# Patient Record
Sex: Male | Born: 1970 | Race: White | Hispanic: No | Marital: Single | Smoking: Former smoker
Health system: Southern US, Community
[De-identification: ages and names within clinical notes are randomized; demographics above are authoritative.]

## PROBLEM LIST (undated history)

## (undated) DIAGNOSIS — F419 Anxiety disorder, unspecified: Secondary | ICD-10-CM

## (undated) DIAGNOSIS — F319 Bipolar disorder, unspecified: Secondary | ICD-10-CM

## (undated) DIAGNOSIS — F329 Major depressive disorder, single episode, unspecified: Secondary | ICD-10-CM

## (undated) DIAGNOSIS — F309 Manic episode, unspecified: Secondary | ICD-10-CM

## (undated) DIAGNOSIS — L409 Psoriasis, unspecified: Secondary | ICD-10-CM

## (undated) DIAGNOSIS — F32A Depression, unspecified: Secondary | ICD-10-CM

## (undated) HISTORY — DX: Manic episode, unspecified: F30.9

## (undated) HISTORY — DX: Bipolar disorder, unspecified: F31.9

## (undated) HISTORY — PX: OTHER SURGICAL HISTORY: SHX169

## (undated) HISTORY — PX: TONSILLECTOMY: SHX5217

## (undated) HISTORY — DX: Major depressive disorder, single episode, unspecified: F32.9

## (undated) HISTORY — PX: WRIST SURGERY: SHX841

## (undated) HISTORY — DX: Anxiety disorder, unspecified: F41.9

## (undated) HISTORY — DX: Depression, unspecified: F32.A

## (undated) HISTORY — DX: Psoriasis, unspecified: L40.9

## (undated) HISTORY — PX: APPENDECTOMY: SHX54

---

## 1997-11-02 ENCOUNTER — Inpatient Hospital Stay (HOSPITAL_COMMUNITY): Admission: EM | Admit: 1997-11-02 | Discharge: 1997-11-05 | Payer: Self-pay | Admitting: Emergency Medicine

## 2002-08-25 ENCOUNTER — Emergency Department (HOSPITAL_COMMUNITY): Admission: EM | Admit: 2002-08-25 | Discharge: 2002-08-25 | Payer: Self-pay | Admitting: Emergency Medicine

## 2003-09-18 ENCOUNTER — Emergency Department (HOSPITAL_COMMUNITY): Admission: EM | Admit: 2003-09-18 | Discharge: 2003-09-19 | Payer: Self-pay | Admitting: Emergency Medicine

## 2013-06-24 ENCOUNTER — Other Ambulatory Visit (HOSPITAL_COMMUNITY): Payer: Self-pay | Admitting: Nurse Practitioner

## 2013-06-24 ENCOUNTER — Ambulatory Visit (HOSPITAL_COMMUNITY)
Admission: RE | Admit: 2013-06-24 | Discharge: 2013-06-24 | Disposition: A | Discharge status: Home / Self Care | Payer: Self-pay | Source: Ambulatory Visit | Attending: Nurse Practitioner | Admitting: Nurse Practitioner

## 2013-06-24 DIAGNOSIS — G8929 Other chronic pain: Secondary | ICD-10-CM

## 2013-06-24 DIAGNOSIS — M533 Sacrococcygeal disorders, not elsewhere classified: Secondary | ICD-10-CM | POA: Insufficient documentation

## 2013-06-24 DIAGNOSIS — M545 Low back pain, unspecified: Secondary | ICD-10-CM | POA: Insufficient documentation

## 2013-07-29 ENCOUNTER — Other Ambulatory Visit (HOSPITAL_COMMUNITY): Payer: Self-pay | Admitting: *Deleted

## 2013-07-29 ENCOUNTER — Ambulatory Visit (HOSPITAL_COMMUNITY)
Admission: RE | Admit: 2013-07-29 | Discharge: 2013-07-29 | Disposition: A | Discharge status: Home / Self Care | Payer: Self-pay | Source: Ambulatory Visit | Attending: *Deleted | Admitting: *Deleted

## 2013-07-29 DIAGNOSIS — M533 Sacrococcygeal disorders, not elsewhere classified: Secondary | ICD-10-CM | POA: Insufficient documentation

## 2013-07-29 DIAGNOSIS — M545 Low back pain, unspecified: Secondary | ICD-10-CM | POA: Insufficient documentation

## 2013-07-29 DIAGNOSIS — IMO0001 Reserved for inherently not codable concepts without codable children: Secondary | ICD-10-CM | POA: Insufficient documentation

## 2013-09-16 ENCOUNTER — Encounter: Payer: Self-pay | Admitting: Family

## 2013-09-16 ENCOUNTER — Ambulatory Visit (INDEPENDENT_AMBULATORY_CARE_PROVIDER_SITE_OTHER): Payer: Self-pay | Admitting: Family

## 2013-09-16 ENCOUNTER — Encounter (INDEPENDENT_AMBULATORY_CARE_PROVIDER_SITE_OTHER): Payer: Self-pay

## 2013-09-16 VITALS — BP 125/79 | HR 79 | Temp 98.7°F | Ht 66.0 in | Wt 140.4 lb

## 2013-09-16 DIAGNOSIS — M533 Sacrococcygeal disorders, not elsewhere classified: Secondary | ICD-10-CM

## 2013-09-16 DIAGNOSIS — F3181 Bipolar II disorder: Secondary | ICD-10-CM | POA: Insufficient documentation

## 2013-09-16 DIAGNOSIS — B009 Herpesviral infection, unspecified: Secondary | ICD-10-CM | POA: Insufficient documentation

## 2013-09-16 DIAGNOSIS — F411 Generalized anxiety disorder: Secondary | ICD-10-CM | POA: Insufficient documentation

## 2013-09-16 NOTE — Progress Notes (Signed)
   Subjective:    Patient ID: Roger Duran, male    DOB: Jul 18, 1970, 43 y.o.   MRN: [id number]  HPI Pt new to office. States he has had a coccyx pain over the last year that has become worse. Pt reports the pain as a dull 9 out 10 constant pain. Pt has had an x-ray of coccyx that showed no fractures, or any other abnormalities. Pt states this is his 4th provider he has seen about this pain. States sitting makes pain worse-Has to use a pillow or sit on his side. Denies any injury.   Review of Systems  Musculoskeletal: Positive for back pain (Coccyx pain).  All other systems reviewed and are negative.      Objective:   Physical Exam  Vitals reviewed. Constitutional: He is oriented to person, place, and time.  Cardiovascular: Normal rate, regular rhythm, normal heart sounds and intact distal pulses.   No murmur heard. Pulmonary/Chest: Effort normal and breath sounds normal. No respiratory distress. He has no wheezes.  Musculoskeletal: He exhibits tenderness.  Coccyx pain   Neurological: He is alert and oriented to person, place, and time.  Skin: Skin is warm and dry.  Psychiatric: He has a normal mood and affect. His behavior is normal. Judgment and thought content normal.    BP 125/79  Pulse 79  Temp(Src) 98.7 F (37.1 C) (Oral)  Ht 5\' 6"  (1.676 m)  Wt 140 lb 6.4 oz (63.685 kg)  BMI 22.67 kg/m2       Assessment & Plan:  1. Coccyx pain Motrin or Tylenol for pain prn -Ice -Pillow to sit if helps - AMB referral to orthopedics  Evelina Dun, FNP

## 2013-09-16 NOTE — Patient Instructions (Addendum)
Tailbone Injury °The tailbone (coccyx) is the small bone at the lower end of the spine. A tailbone injury may involve stretched ligaments, bruising, or a broken bone (fracture). Women are more vulnerable to this injury due to having a wider pelvis. °CAUSES  °This type of injury typically occurs from falling and landing on the tailbone. Repeated strain or friction from actions such as rowing and bicycling may also injure the area. The tailbone can be injured during childbirth. Infections or tumors may also press on the tailbone and cause pain. Sometimes, the cause of injury is unknown. °SYMPTOMS  °· Bruising. °· Pain when sitting. °· Painful bowel movements. °· In women, pain during intercourse. °DIAGNOSIS  °Your caregiver can diagnose a tailbone injury based on your symptoms and a physical exam. X-rays may be taken if a fracture is suspected. Your caregiver may also use an MRI scan imaging test to evaluate your symptoms. °TREATMENT  °Your caregiver may prescribe medicines to help relieve your pain. Most tailbone injuries heal on their own in 4 to 6 weeks. However, if the injury is caused by an infection or tumor, the recovery period may vary. °PREVENTION  °Wear appropriate padding and sports gear when bicycling and rowing. This can help prevent an injury from repeated strain or friction. °HOME CARE INSTRUCTIONS  °· Put ice on the injured area. °· Put ice in a plastic bag. °· Place a towel between your skin and the bag. °· Leave the ice on for 15-20 minutes, every hour while awake for the first 1 to 2 days. °· Sit on a large, rubber or inflated ring or cushion to ease your pain. Lean forward when sitting to help decrease discomfort. °· Avoid sitting for long periods of time. °· Increase your activity as the pain allows. °· Only take over-the-counter or prescription medicines for pain, discomfort, or fever as directed by your caregiver. °· You may use stool softeners if it is painful to have a bowel movement, or as  directed by your caregiver. °· Eat a diet with plenty of fiber to help prevent constipation. °· Keep all follow-up appointments as directed by your caregiver. °SEEK MEDICAL CARE IF:  °· Your pain becomes worse. °· Your bowel movements cause a great deal of discomfort. °· You are unable to have a bowel movement. °· You have a fever. °MAKE SURE YOU: °· Understand these instructions. °· Will watch your condition. °· Will get help right away if you are not doing well or get worse. °Document Released: 04/15/2000 Document Revised: 07/11/2011 Document Reviewed: 11/11/2010 °[employer]® Patient Information ©2014 [employer]. ° °

## 2013-10-02 DIAGNOSIS — F339 Major depressive disorder, recurrent, unspecified: Secondary | ICD-10-CM | POA: Diagnosis not present

## 2013-10-18 DIAGNOSIS — M533 Sacrococcygeal disorders, not elsewhere classified: Secondary | ICD-10-CM | POA: Diagnosis not present

## 2013-10-18 DIAGNOSIS — S99919A Unspecified injury of unspecified ankle, initial encounter: Secondary | ICD-10-CM | POA: Diagnosis not present

## 2013-10-18 DIAGNOSIS — S298XXA Other specified injuries of thorax, initial encounter: Secondary | ICD-10-CM | POA: Diagnosis not present

## 2013-10-18 DIAGNOSIS — S6990XA Unspecified injury of unspecified wrist, hand and finger(s), initial encounter: Secondary | ICD-10-CM | POA: Diagnosis not present

## 2013-10-18 DIAGNOSIS — IMO0002 Reserved for concepts with insufficient information to code with codable children: Secondary | ICD-10-CM | POA: Diagnosis not present

## 2013-10-18 DIAGNOSIS — F341 Dysthymic disorder: Secondary | ICD-10-CM | POA: Diagnosis not present

## 2013-10-18 DIAGNOSIS — S59909A Unspecified injury of unspecified elbow, initial encounter: Secondary | ICD-10-CM | POA: Diagnosis not present

## 2013-10-18 DIAGNOSIS — M25539 Pain in unspecified wrist: Secondary | ICD-10-CM | POA: Diagnosis not present

## 2013-10-18 DIAGNOSIS — M79609 Pain in unspecified limb: Secondary | ICD-10-CM | POA: Diagnosis not present

## 2013-10-18 DIAGNOSIS — M25579 Pain in unspecified ankle and joints of unspecified foot: Secondary | ICD-10-CM | POA: Diagnosis not present

## 2013-10-18 DIAGNOSIS — S59919A Unspecified injury of unspecified forearm, initial encounter: Secondary | ICD-10-CM | POA: Diagnosis not present

## 2013-10-18 DIAGNOSIS — S8990XA Unspecified injury of unspecified lower leg, initial encounter: Secondary | ICD-10-CM | POA: Diagnosis not present

## 2013-10-18 DIAGNOSIS — M25559 Pain in unspecified hip: Secondary | ICD-10-CM | POA: Diagnosis not present

## 2013-10-22 DIAGNOSIS — L408 Other psoriasis: Secondary | ICD-10-CM | POA: Diagnosis not present

## 2013-10-22 DIAGNOSIS — K219 Gastro-esophageal reflux disease without esophagitis: Secondary | ICD-10-CM | POA: Diagnosis not present

## 2013-10-22 DIAGNOSIS — IMO0002 Reserved for concepts with insufficient information to code with codable children: Secondary | ICD-10-CM | POA: Diagnosis not present

## 2013-10-22 DIAGNOSIS — Z1331 Encounter for screening for depression: Secondary | ICD-10-CM | POA: Diagnosis not present

## 2013-10-22 DIAGNOSIS — F410 Panic disorder [episodic paroxysmal anxiety] without agoraphobia: Secondary | ICD-10-CM | POA: Diagnosis not present

## 2013-10-22 DIAGNOSIS — F319 Bipolar disorder, unspecified: Secondary | ICD-10-CM | POA: Diagnosis not present

## 2013-10-22 DIAGNOSIS — M545 Low back pain, unspecified: Secondary | ICD-10-CM | POA: Diagnosis not present

## 2013-10-30 DIAGNOSIS — L408 Other psoriasis: Secondary | ICD-10-CM | POA: Diagnosis not present

## 2013-10-30 DIAGNOSIS — Z79899 Other long term (current) drug therapy: Secondary | ICD-10-CM | POA: Diagnosis not present

## 2013-12-24 DIAGNOSIS — Z79899 Other long term (current) drug therapy: Secondary | ICD-10-CM | POA: Diagnosis not present

## 2013-12-24 DIAGNOSIS — Z09 Encounter for follow-up examination after completed treatment for conditions other than malignant neoplasm: Secondary | ICD-10-CM | POA: Diagnosis not present

## 2013-12-24 DIAGNOSIS — L408 Other psoriasis: Secondary | ICD-10-CM | POA: Diagnosis not present

## 2013-12-25 DIAGNOSIS — F339 Major depressive disorder, recurrent, unspecified: Secondary | ICD-10-CM | POA: Diagnosis not present

## 2014-01-07 ENCOUNTER — Ambulatory Visit (INDEPENDENT_AMBULATORY_CARE_PROVIDER_SITE_OTHER): Payer: Medicare Other | Admitting: Nurse Practitioner

## 2014-01-07 VITALS — BP 130/70 | HR 97 | Temp 98.4°F | Ht 66.0 in | Wt 143.0 lb

## 2014-01-07 DIAGNOSIS — B029 Zoster without complications: Secondary | ICD-10-CM

## 2014-01-07 MED ORDER — VALACYCLOVIR HCL 1 G PO TABS: 1000.0000 mg | ORAL_TABLET | Freq: Three times a day (TID) | ORAL | Status: DC

## 2014-01-07 MED ORDER — HYDROCODONE-ACETAMINOPHEN 5-325 MG PO TABS: 1.0000 | ORAL_TABLET | Freq: Four times a day (QID) | ORAL | Status: DC | PRN

## 2014-01-07 MED ORDER — HYDROCODONE-ACETAMINOPHEN 5-325 MG PO TABS
1.0000 | ORAL_TABLET | Freq: Four times a day (QID) | ORAL | Status: DC | PRN
Start: 2014-01-07 — End: 2014-01-07

## 2014-01-07 NOTE — Addendum Note (Signed)
Addended by: Chevis Pretty on: 01/07/2014 04:28 PM   Modules accepted: Orders

## 2014-01-07 NOTE — Patient Instructions (Signed)

## 2014-01-07 NOTE — Progress Notes (Signed)
   Subjective:    Patient ID: Roger Duran, male    DOB: 1970-12-31, 43 y.o.   MRN: [id number]  HPI Patient has a red annular area that started itching with some swelling on Sunday.  Having a burning sensation now with small yellow centered pustules with redness.  No drainage noted.  Not aware of any insect bite.    Review of Systems  Constitutional: Negative.   Respiratory: Negative.   Cardiovascular: Negative.   Skin:       Reddened area on left knee       Objective:   Physical Exam  Constitutional: He is oriented to person, place, and time. He appears well-developed and well-nourished.  Cardiovascular: Normal rate and normal heart sounds.   Pulmonary/Chest: Effort normal and breath sounds normal.  Neurological: He is alert and oriented to person, place, and time.  Skin: Rash noted.  Vesicular patchy lesions on right knee cap  Psychiatric: He has a normal mood and affect. His behavior is normal. Judgment and thought content normal.    BP 130/70  Pulse 97  Temp(Src) 98.4 F (36.9 C) (Oral)  Ht 5\' 6"  (1.676 m)  Wt 143 lb (64.864 kg)  BMI 23.09 kg/m2       Assessment & Plan:   1. Shingles    Meds ordered this encounter  Medications  . valACYclovir (VALTREX) 1000 MG tablet    Sig: Take 1 tablet (1,000 mg total) by mouth 3 (three) times daily.    Dispense:  21 tablet    Refill:  0    Order Specific Question:  Supervising Provider    Answer:  Chipper Herb [[id number]]  . HYDROcodone-acetaminophen (NORCO/VICODIN) 5-325 MG per tablet    Sig: Take 1 tablet by mouth every 6 (six) hours as needed for moderate pain.    Dispense:  40 tablet    Refill:  0    Order Specific Question:  Supervising Provider    Answer:  Chipper Herb [[id number]]   Avoid scratching or picking Cool compresses RTO prn   Mary-Margaret Hassell Done, FNP

## 2014-01-27 DIAGNOSIS — L0390 Cellulitis, unspecified: Secondary | ICD-10-CM | POA: Diagnosis not present

## 2014-01-27 DIAGNOSIS — L0291 Cutaneous abscess, unspecified: Secondary | ICD-10-CM | POA: Diagnosis not present

## 2014-02-17 DIAGNOSIS — Z23 Encounter for immunization: Secondary | ICD-10-CM | POA: Diagnosis not present

## 2014-07-09 ENCOUNTER — Ambulatory Visit (INDEPENDENT_AMBULATORY_CARE_PROVIDER_SITE_OTHER): Payer: Medicare Other | Admitting: Psychiatry

## 2014-07-09 ENCOUNTER — Encounter (HOSPITAL_COMMUNITY): Payer: Self-pay | Admitting: Psychiatry

## 2014-07-09 VITALS — BP 128/78 | HR 81 | Ht 66.0 in | Wt 150.8 lb

## 2014-07-09 DIAGNOSIS — F3130 Bipolar disorder, current episode depressed, mild or moderate severity, unspecified: Secondary | ICD-10-CM

## 2014-07-09 DIAGNOSIS — F3160 Bipolar disorder, current episode mixed, unspecified: Secondary | ICD-10-CM

## 2014-07-09 MED ORDER — LITHIUM CARBONATE 300 MG PO CAPS: 600.0000 mg | ORAL_CAPSULE | Freq: Two times a day (BID) | ORAL | Status: DC

## 2014-07-09 MED ORDER — GABAPENTIN 100 MG PO CAPS: 200.0000 mg | ORAL_CAPSULE | Freq: Three times a day (TID) | ORAL | Status: DC

## 2014-07-09 MED ORDER — LAMOTRIGINE 200 MG PO TABS: 200.0000 mg | ORAL_TABLET | Freq: Every day | ORAL | Status: DC

## 2014-07-09 MED ORDER — CLONAZEPAM 1 MG PO TABS: 1.0000 mg | ORAL_TABLET | Freq: Three times a day (TID) | ORAL | Status: DC
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
## 2014-08-08 ENCOUNTER — Encounter (HOSPITAL_COMMUNITY): Payer: Self-pay | Admitting: Psychiatry

## 2014-08-08 ENCOUNTER — Ambulatory Visit (INDEPENDENT_AMBULATORY_CARE_PROVIDER_SITE_OTHER): Payer: Medicare Other | Admitting: Psychiatry

## 2014-08-08 VITALS — BP 119/72 | HR 74 | Ht 66.0 in | Wt 152.2 lb

## 2014-08-08 DIAGNOSIS — F3130 Bipolar disorder, current episode depressed, mild or moderate severity, unspecified: Secondary | ICD-10-CM
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
## 2014-09-12 ENCOUNTER — Other Ambulatory Visit (HOSPITAL_COMMUNITY): Payer: Self-pay | Admitting: Adult Health Nurse Practitioner

## 2014-09-12 DIAGNOSIS — R52 Pain, unspecified: Secondary | ICD-10-CM

## 2014-09-12 DIAGNOSIS — R197 Diarrhea, unspecified: Secondary | ICD-10-CM

## 2014-09-12 DIAGNOSIS — R159 Full incontinence of feces: Secondary | ICD-10-CM

## 2014-09-16 ENCOUNTER — Ambulatory Visit (HOSPITAL_COMMUNITY)
Admission: RE | Admit: 2014-09-16 | Discharge: 2014-09-16 | Disposition: A | Discharge status: Home / Self Care | Payer: Medicare Other | Source: Ambulatory Visit | Attending: Adult Health Nurse Practitioner | Admitting: Adult Health Nurse Practitioner

## 2014-09-16 DIAGNOSIS — R52 Pain, unspecified: Secondary | ICD-10-CM

## 2014-09-16 DIAGNOSIS — R197 Diarrhea, unspecified: Secondary | ICD-10-CM | POA: Insufficient documentation

## 2014-09-16 DIAGNOSIS — R159 Full incontinence of feces: Secondary | ICD-10-CM | POA: Diagnosis not present

## 2014-09-19 ENCOUNTER — Other Ambulatory Visit (HOSPITAL_COMMUNITY): Payer: Self-pay

## 2014-09-23 ENCOUNTER — Encounter (INDEPENDENT_AMBULATORY_CARE_PROVIDER_SITE_OTHER): Payer: Self-pay | Admitting: *Deleted

## 2014-09-30 ENCOUNTER — Other Ambulatory Visit (HOSPITAL_COMMUNITY): Payer: Self-pay | Admitting: Adult Health Nurse Practitioner

## 2014-09-30 ENCOUNTER — Other Ambulatory Visit (HOSPITAL_COMMUNITY): Payer: Self-pay | Admitting: Family Medicine

## 2014-09-30 DIAGNOSIS — R1084 Generalized abdominal pain: Secondary | ICD-10-CM

## 2014-09-30 DIAGNOSIS — R197 Diarrhea, unspecified: Secondary | ICD-10-CM

## 2014-10-03 ENCOUNTER — Encounter (HOSPITAL_COMMUNITY): Payer: Self-pay

## 2014-10-03 ENCOUNTER — Encounter (HOSPITAL_COMMUNITY)
Admission: RE | Admit: 2014-10-03 | Discharge: 2014-10-03 | Disposition: A | Discharge status: Home / Self Care | Payer: Medicare Other | Source: Ambulatory Visit | Attending: Adult Health Nurse Practitioner | Admitting: Adult Health Nurse Practitioner

## 2014-10-03 DIAGNOSIS — R197 Diarrhea, unspecified: Secondary | ICD-10-CM | POA: Diagnosis not present

## 2014-10-03 DIAGNOSIS — R932 Abnormal findings on diagnostic imaging of liver and biliary tract: Secondary | ICD-10-CM | POA: Insufficient documentation

## 2014-10-03 DIAGNOSIS — R1084 Generalized abdominal pain: Secondary | ICD-10-CM | POA: Diagnosis present

## 2014-10-03 MED ORDER — STERILE WATER FOR INJECTION IJ SOLN
INTRAMUSCULAR | Status: AC
  Administered 2014-10-03: 1.36 mL via INTRAVENOUS
  Filled 2014-10-03: qty 10

## 2014-10-03 MED ORDER — SINCALIDE 5 MCG IJ SOLR
INTRAMUSCULAR | Status: AC
  Administered 2014-10-03: 1.36 ug via INTRAVENOUS
  Filled 2014-10-03: qty 5

## 2014-10-03 MED ORDER — TECHNETIUM TC 99M MEBROFENIN IV KIT
5.0000 | PACK | Freq: Once | INTRAVENOUS | Status: AC | PRN
  Administered 2014-10-03: 4.8 via INTRAVENOUS

## 2014-10-03 MED ORDER — SODIUM CHLORIDE 0.9 % IJ SOLN
INTRAMUSCULAR | Status: AC
  Filled 2014-10-03: qty 12

## 2014-10-08 ENCOUNTER — Encounter (HOSPITAL_COMMUNITY): Payer: Self-pay | Admitting: Psychiatry

## 2014-10-08 ENCOUNTER — Ambulatory Visit (INDEPENDENT_AMBULATORY_CARE_PROVIDER_SITE_OTHER): Payer: Medicare Other | Admitting: Psychiatry

## 2014-10-08 VITALS — BP 128/77 | HR 67 | Ht 66.0 in | Wt 148.8 lb

## 2014-10-08 DIAGNOSIS — F3160 Bipolar disorder, current episode mixed, unspecified: Secondary | ICD-10-CM | POA: Diagnosis not present

## 2014-10-08 DIAGNOSIS — F3130 Bipolar disorder, current episode depressed, mild or moderate severity, unspecified: Secondary | ICD-10-CM

## 2014-10-08 MED ORDER — GABAPENTIN 100 MG PO CAPS: 200.0000 mg | ORAL_CAPSULE | Freq: Three times a day (TID) | ORAL | Status: DC

## 2014-10-08 MED ORDER — LAMOTRIGINE 200 MG PO TABS: 300.0000 mg | ORAL_TABLET | Freq: Every day | ORAL | Status: DC

## 2014-10-08 MED ORDER — LITHIUM CARBONATE 300 MG PO CAPS: 600.0000 mg | ORAL_CAPSULE | Freq: Two times a day (BID) | ORAL | Status: DC

## 2014-10-08 MED ORDER — CLONAZEPAM 1 MG PO TABS: 1.0000 mg | ORAL_TABLET | Freq: Four times a day (QID) | ORAL | Status: DC
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
## 2014-10-15 ENCOUNTER — Encounter (INDEPENDENT_AMBULATORY_CARE_PROVIDER_SITE_OTHER): Payer: Self-pay | Admitting: Internal Medicine

## 2014-10-15 ENCOUNTER — Ambulatory Visit (INDEPENDENT_AMBULATORY_CARE_PROVIDER_SITE_OTHER): Payer: Medicare Other | Admitting: Internal Medicine

## 2014-10-15 VITALS — BP 86/60 | HR 76 | Temp 98.7°F | Ht 66.0 in | Wt 150.6 lb

## 2014-10-15 DIAGNOSIS — R152 Fecal urgency: Secondary | ICD-10-CM | POA: Diagnosis not present

## 2014-10-15 DIAGNOSIS — R197 Diarrhea, unspecified: Secondary | ICD-10-CM | POA: Diagnosis not present

## 2014-10-15 LAB — HEPATIC FUNCTION PANEL
ALBUMIN: 4.9 g/dL (ref 3.5–5.2)
ALK PHOS: 85 U/L (ref 39–117)
ALT: 57 U/L — ABNORMAL HIGH (ref 0–53)
AST: 36 U/L (ref 0–37)
BILIRUBIN DIRECT: 0.1 mg/dL (ref 0.0–0.3)
BILIRUBIN INDIRECT: 0.5 mg/dL (ref 0.2–1.2)
BILIRUBIN TOTAL: 0.6 mg/dL (ref 0.2–1.2)
Total Protein: 7.4 g/dL (ref 6.0–8.3)

## 2014-10-15 LAB — CBC WITH DIFFERENTIAL/PLATELET
BASOS ABS: 0.1 10*3/uL (ref 0.0–0.1)
Basophils Relative: 1 % (ref 0–1)
Eosinophils Absolute: 0.3 10*3/uL (ref 0.0–0.7)
Eosinophils Relative: 3 % (ref 0–5)
HCT: 44.7 % (ref 39.0–52.0)
Hemoglobin: 15.1 g/dL (ref 13.0–17.0)
LYMPHS ABS: 1.4 10*3/uL (ref 0.7–4.0)
LYMPHS PCT: 14 % (ref 12–46)
MCH: 35.4 pg — ABNORMAL HIGH (ref 26.0–34.0)
MCHC: 33.8 g/dL (ref 30.0–36.0)
MCV: 104.7 fL — ABNORMAL HIGH (ref 78.0–100.0)
MONO ABS: 0.7 10*3/uL (ref 0.1–1.0)
MONOS PCT: 7 % (ref 3–12)
MPV: 10.2 fL (ref 8.6–12.4)
NEUTROS ABS: 7.3 10*3/uL (ref 1.7–7.7)
Neutrophils Relative %: 75 % (ref 43–77)
Platelets: 366 10*3/uL (ref 150–400)
RBC: 4.27 MIL/uL (ref 4.22–5.81)
RDW: 15.1 % (ref 11.5–15.5)
WBC: 9.7 10*3/uL (ref 4.0–10.5)

## 2014-10-15 MED ORDER — DICYCLOMINE HCL 10 MG PO CAPS: 10.0000 mg | ORAL_CAPSULE | Freq: Three times a day (TID) | ORAL | Status: DC

## 2014-10-15 NOTE — Progress Notes (Signed)
Subjective:    Patient ID: Roger Duran, male    DOB: 1971-04-10, 44 y.o.   MRN: [id number]  HPI Referred to our office by Bridget Hartshorn NP for diarrhea. He says he always has diarrhea due to the Lithium.  For the past 6 months he has had explosive diarrhea.    . He says he is incontinent of stool at times. He has urgency.  He has tried Imodium but it will constipate him for a few days.  He says the diarrhe uncontrollable. He says his stool smells like fish.  He is having 4-5 stools a day . He has not seen any blood.  Stools cultures earlier were negative. Doxycycline about 2-3 months ago for dermatitis. Appetite is good for the most part. There has been no weight loss. He says he has gained 15 pounds since last years.  No family hx of colon cancer. Father has polyps. No NSAIDs.  Recently had an abnormal HIDA scan. See below.  07/31/2013 H and H 14.6 and 43.1, MCV 100.7, platelet ct 388, total bili 0.7, ALP 73, ASAT 15, ALT 13, total protein 7.0, Albumin 4.5, Calcium 9.5, Lipase 11, H. Pylori negative.   10/03/2014 HIDA scan:  IMPRESSION: Abnormally low gallbladder ejection fraction of 17%.   Review of Systems Single, no children. Disabled.  Past Medical History  Diagnosis Date  . Depression   . Anxiety   . Mania   . Psoriasis   . Bipolar 1 disorder     Past Surgical History  Procedure Laterality Date  . Appendectomy    . Tonsillectomy    . Wrist surgery      Allergies  Allergen Reactions  . Penicillins Itching and Rash    Current Outpatient Prescriptions on File Prior to Visit  Medication Sig Dispense Refill  . acyclovir (ZOVIRAX) 400 MG tablet Take 400 mg by mouth 2 (two) times daily.    . clonazePAM (KLONOPIN) 1 MG tablet Take 1 tablet (1 mg total) by mouth 4 (four) times daily. 120 tablet 2  . ergocalciferol (VITAMIN D2) 50000 UNITS capsule Take 50,000 Units by mouth once a week.    . Folic Acid-Vit S0-YTK Z60 (FOLBEE) 2.5-25-1 MG TABS tablet Take 1 tablet  by mouth daily.    Marland Kitchen gabapentin (NEURONTIN) 100 MG capsule Take 2 capsules (200 mg total) by mouth 3 (three) times daily. 180 capsule 2  . lamoTRIgine (LAMICTAL) 200 MG tablet Take 1.5 tablets (300 mg total) by mouth daily. 45 tablet 2  . lithium carbonate 300 MG capsule Take 2 capsules (600 mg total) by mouth 2 (two) times daily with a meal. 120 capsule 2  . loratadine (CLARITIN) 10 MG tablet Take 10 mg by mouth daily.    . Melatonin 10 MG TABS Take 20 mg by mouth at bedtime.    . methotrexate (RHEUMATREX) 2.5 MG tablet Take 2.5 mg by mouth. TAKING 3 TABLETS ON Monday and 3 TABLETS ON Friday..Caution:Chemotherapy. Protect from light.    Marland Kitchen omeprazole (PRILOSEC) 20 MG capsule Take 20 mg by mouth daily.    . vitamin C (ASCORBIC ACID) 500 MG tablet Take 500 mg by mouth daily.     No current facility-administered medications on file prior to visit.         Objective:   Physical Exam Blood pressure 86/60, pulse 76, temperature 98.7 F (37.1 C), height 5\' 6"  (1.676 m), weight 150 lb 9.6 oz (68.312 kg). Alert and oriented. Skin warm and dry. Oral mucosa  is moist.   . Sclera anicteric, conjunctivae is pink. Thyroid not enlarged. No cervical lymphadenopathy. Lungs clear. Heart regular rate and rhythm.  Abdomen is soft. Bowel sounds are positive. No hepatomegaly. No abdominal masses felt. No tenderness.  No edema to lower extremities.  Stool brown, loose and guaiac negative.   Developer: [id number] ( 03/2018) Card: Lot [id number] (12/18)      Assessment & Plan:  Diarrhea. Stool studies. Dicyclomine 10mg  TID. CBC, CMET, Further recommendations once we have these test back.  If all are normal, he may need a colonoscopy. Follow up with NP at [facility] concerning your HIDA scan.

## 2014-10-15 NOTE — Patient Instructions (Addendum)
CBC, CMET, GI pathogen. Dicyclomine 10mg  TID. Further recommendations to follow. If all are normal, will need a colonoscopy.  Follow up Belenda Cruise concerning abnormal HIDA scan.

## 2014-10-16 LAB — BASIC METABOLIC PANEL
BUN: 6 mg/dL (ref 6–23)
CHLORIDE: 107 meq/L (ref 96–112)
CO2: 30 meq/L (ref 19–32)
CREATININE: 0.99 mg/dL (ref 0.50–1.35)
Calcium: 10.1 mg/dL (ref 8.4–10.5)
GLUCOSE: 66 mg/dL — AB (ref 70–99)
POTASSIUM: 4.6 meq/L (ref 3.5–5.3)
Sodium: 143 mEq/L (ref 135–145)

## 2014-10-16 LAB — LITHIUM LEVEL: LITHIUM LVL: 1.1 meq/L (ref 0.80–1.40)

## 2014-10-17 LAB — GASTROINTESTINAL PATHOGEN PANEL PCR
C. difficile Tox A/B, PCR: NEGATIVE
CAMPYLOBACTER, PCR: NEGATIVE
CRYPTOSPORIDIUM, PCR: NEGATIVE
E COLI (ETEC) LT/ST, PCR: NEGATIVE
E coli (STEC) stx1/stx2, PCR: NEGATIVE
E coli 0157, PCR: NEGATIVE
Giardia lamblia, PCR: NEGATIVE
NOROVIRUS, PCR: NEGATIVE
ROTAVIRUS, PCR: NEGATIVE
Salmonella, PCR: NEGATIVE
Shigella, PCR: NEGATIVE

## 2014-10-23 ENCOUNTER — Other Ambulatory Visit (INDEPENDENT_AMBULATORY_CARE_PROVIDER_SITE_OTHER): Payer: Self-pay | Admitting: *Deleted

## 2014-10-23 ENCOUNTER — Telehealth (INDEPENDENT_AMBULATORY_CARE_PROVIDER_SITE_OTHER): Payer: Self-pay | Admitting: *Deleted

## 2014-10-23 ENCOUNTER — Telehealth (INDEPENDENT_AMBULATORY_CARE_PROVIDER_SITE_OTHER): Payer: Self-pay | Admitting: Internal Medicine

## 2014-10-23 DIAGNOSIS — R197 Diarrhea, unspecified: Secondary | ICD-10-CM

## 2014-10-23 NOTE — Telephone Encounter (Signed)
TCS sch'd 11/14/14, patient aware

## 2014-10-23 NOTE — Progress Notes (Signed)
TCS sch'd 11/14/14, patient aware

## 2014-10-23 NOTE — Telephone Encounter (Signed)
Patient needs trilyte 

## 2014-10-23 NOTE — Telephone Encounter (Signed)
Roger Duran, please schedule a colonoscopy

## 2014-10-27 MED ORDER — PEG 3350-KCL-NA BICARB-NACL 420 G PO SOLR: 4000.0000 mL | Freq: Once | ORAL | Status: DC

## 2014-11-14 ENCOUNTER — Encounter (HOSPITAL_COMMUNITY): Payer: Self-pay | Admitting: *Deleted

## 2014-11-14 ENCOUNTER — Ambulatory Visit (HOSPITAL_COMMUNITY)
Admission: RE | Admit: 2014-11-14 | Discharge: 2014-11-14 | Disposition: A | Discharge status: Home / Self Care | Payer: Medicare Other | Source: Ambulatory Visit | Attending: Internal Medicine | Admitting: Internal Medicine

## 2014-11-14 ENCOUNTER — Encounter (HOSPITAL_COMMUNITY)
Admission: RE | Disposition: A | Discharge status: Home / Self Care | Payer: Self-pay | Source: Ambulatory Visit | Attending: Internal Medicine

## 2014-11-14 DIAGNOSIS — K5730 Diverticulosis of large intestine without perforation or abscess without bleeding: Secondary | ICD-10-CM | POA: Insufficient documentation

## 2014-11-14 DIAGNOSIS — Z880 Allergy status to penicillin: Secondary | ICD-10-CM | POA: Insufficient documentation

## 2014-11-14 DIAGNOSIS — F319 Bipolar disorder, unspecified: Secondary | ICD-10-CM | POA: Diagnosis not present

## 2014-11-14 DIAGNOSIS — R152 Fecal urgency: Secondary | ICD-10-CM | POA: Insufficient documentation

## 2014-11-14 DIAGNOSIS — Z8371 Family history of colonic polyps: Secondary | ICD-10-CM | POA: Diagnosis not present

## 2014-11-14 DIAGNOSIS — F419 Anxiety disorder, unspecified: Secondary | ICD-10-CM | POA: Diagnosis not present

## 2014-11-14 DIAGNOSIS — R197 Diarrhea, unspecified: Secondary | ICD-10-CM | POA: Diagnosis not present

## 2014-11-14 DIAGNOSIS — F17210 Nicotine dependence, cigarettes, uncomplicated: Secondary | ICD-10-CM | POA: Diagnosis not present

## 2014-11-14 DIAGNOSIS — K648 Other hemorrhoids: Secondary | ICD-10-CM | POA: Insufficient documentation

## 2014-11-14 DIAGNOSIS — L409 Psoriasis, unspecified: Secondary | ICD-10-CM | POA: Diagnosis not present

## 2014-11-14 HISTORY — PX: COLONOSCOPY: SHX5424

## 2014-11-14 SURGERY — COLONOSCOPY
Anesthesia: Moderate Sedation

## 2014-11-14 MED ORDER — MEPERIDINE HCL 50 MG/ML IJ SOLN
INTRAMUSCULAR | Status: AC
  Filled 2014-11-14: qty 1

## 2014-11-14 MED ORDER — MIDAZOLAM HCL 5 MG/5ML IJ SOLN
INTRAMUSCULAR | Status: AC
  Filled 2014-11-14: qty 10

## 2014-11-14 MED ORDER — BENEFIBER DRINK MIX PO PACK: 4.0000 g | PACK | Freq: Every day | ORAL | Status: DC

## 2014-11-14 MED ORDER — MEPERIDINE HCL 50 MG/ML IJ SOLN
INTRAMUSCULAR | Status: DC | PRN
  Administered 2014-11-14 (×2): 25 mg via INTRAVENOUS

## 2014-11-14 MED ORDER — MIDAZOLAM HCL 5 MG/5ML IJ SOLN
INTRAMUSCULAR | Status: AC
  Filled 2014-11-14: qty 5

## 2014-11-14 MED ORDER — SODIUM CHLORIDE 0.9 % IV SOLN
INTRAVENOUS | Status: DC
  Administered 2014-11-14: 08:00:00 via INTRAVENOUS

## 2014-11-14 MED ORDER — STERILE WATER FOR IRRIGATION IR SOLN
Status: DC | PRN
  Administered 2014-11-14: 09:00:00

## 2014-11-14 MED ORDER — MIDAZOLAM HCL 5 MG/5ML IJ SOLN
INTRAMUSCULAR | Status: DC | PRN
  Administered 2014-11-14: 4 mg via INTRAVENOUS
  Administered 2014-11-14: 3 mg via INTRAVENOUS
  Administered 2014-11-14: 2 mg via INTRAVENOUS
  Administered 2014-11-14: 3 mg via INTRAVENOUS

## 2014-11-14 NOTE — Op Note (Signed)
COLONOSCOPY PROCEDURE REPORT  PATIENT:  Roger Duran  MR#:  [id number] Birthdate:  03-01-1971, 44 y.o., male Endoscopist:  Dr. Rogene Houston, MD Referred By:  Ms. Marlyce Huge, RN Procedure Date: 11/14/2014  Procedure:   Colonoscopy  Indications:  Patient is 44 year old Caucasian male who presents with chronic diarrhea. He's had diarrhea since 2009 but lately frequency has increased and he is having this procedure bowel movements with urgency and few accidents. He suspected to have IBS but has not responded therapy. Family history significant for multiple colonic polyps in his father and he lost first cousin CRC in her late 20s around 21.  Informed Consent:  The procedure and risks were reviewed with the patient and informed consent was obtained.  Medications:  Demerol 50 mg IV Versed 12 mg IV  Description of procedure:  After a digital rectal exam was performed, that colonoscope was advanced from the anus through the rectum and colon to the area of the cecum, ileocecal valve and appendiceal orifice. The cecum was deeply intubated. These structures were well-seen and photographed for the record. From the level of the cecum and ileocecal valve, the scope was slowly and cautiously withdrawn. The mucosal surfaces were carefully surveyed utilizing scope tip to flexion to facilitate fold flattening as needed. The scope was pulled down into the rectum where a thorough exam including retroflexion was performed. Terminal ileum was also examined.  Findings:   Prep excellent. Mucosa of terminal ileum. Normal mucosa of cecum, ascending colon, hepatic flexure, transverse colon, splenic flexure, descending and sigmoid colon. Two small diverticula noted at distal sigmoid colon. Normal rectal mucosa. Small hemorrhoids proximal to dentate line.   Therapeutic/Diagnostic Maneuvers Performed:  None biopsies taken from mucosa of sigmoid colon  Complications:  None  Cecal Withdrawal Time:   12 minutes  Impression:  Normal mucosa of terminal ileum. No evidence of endoscopic colitis or polyps. Two small diverticula distal sigmoid colon. Internal hemorrhoids. Biopsies taken from mucosa of sigmoid colon.  Recommendations:  Standard instructions given. High fiber diet. Fiber 4 g by mouth daily at bedtime. I will contact patient with biopsy results and further recommendations.  Samiel Peel U  11/14/2014 9:15 AM  CC: Dr. Bridget Hartshorn, RN & Dr. Rayne Du ref. provider found

## 2014-11-14 NOTE — H&P (Signed)
Roger Duran is an 44 y.o. male.   Chief Complaint: Patient is here for colonoscopy. HPI: Patient is 44 year old Caucasian male who has chronic diarrhea. He's had diarrhea since 2009 but over the last 6 months is having more bowel movements. He also has had explosive diarrhea with urgency. Dicyclomine did not help and Imodium and constipated. He denies rectal bleeding. He had GI pathogen panel which was negative. Family history significant for multiple colonic polyps in his father who is 45 years old 65 one cousin because of colon carcinoma. She was possibly in her late 58s around 75.  Past Medical History  Diagnosis Date  . Depression   . Anxiety   . Mania   . Psoriasis   . Bipolar 1 disorder     Past Surgical History  Procedure Laterality Date  . Appendectomy    . Tonsillectomy    . Wrist surgery      Family History  Problem Relation Age of Onset  . Hyperlipidemia Mother   . Heart disease Father   . Anxiety disorder Father   . Bipolar disorder Maternal Grandfather    Social History:  reports that he has been smoking Cigarettes.  He has been smoking about 0.50 packs per day. He does not have any smokeless tobacco history on file. He reports that he drinks alcohol. He reports that he does not use illicit drugs.  Allergies:  Allergies  Allergen Reactions  . Penicillins Itching and Rash    Medications Prior to Admission  Medication Sig Dispense Refill  . acyclovir (ZOVIRAX) 400 MG tablet Take 400 mg by mouth 2 (two) times daily.    . clonazePAM (KLONOPIN) 1 MG tablet Take 1 tablet (1 mg total) by mouth 4 (four) times daily. 120 tablet 2  . cycloSPORINE (RESTASIS) 0.05 % ophthalmic emulsion Place 1 drop into both eyes 2 (two) times daily.    Marland Kitchen dicyclomine (BENTYL) 10 MG capsule Take 1 capsule (10 mg total) by mouth 3 (three) times daily before meals. 90 capsule 0  . ergocalciferol (VITAMIN D2) 50000 UNITS capsule Take 50,000 Units by mouth once a week.    . Folic  Acid-Vit Y0-VPX B12 (FOLBEE) 2.5-25-1 MG TABS tablet Take 1 tablet by mouth daily.    Marland Kitchen gabapentin (NEURONTIN) 100 MG capsule Take 2 capsules (200 mg total) by mouth 3 (three) times daily. 180 capsule 2  . lamoTRIgine (LAMICTAL) 200 MG tablet Take 1.5 tablets (300 mg total) by mouth daily. 45 tablet 2  . lithium carbonate 300 MG capsule Take 2 capsules (600 mg total) by mouth 2 (two) times daily with a meal. 120 capsule 2  . loratadine (CLARITIN) 10 MG tablet Take 10 mg by mouth daily.    . Melatonin 10 MG TABS Take 20 mg by mouth at bedtime.    . methotrexate (RHEUMATREX) 2.5 MG tablet Take 2.5 mg by mouth 2 (two) times a week. TAKING 3 TABLETS ON Tuesday and 3 TABLETS ON Friday..Caution:Chemotherapy. Protect from light.    Marland Kitchen omeprazole (PRILOSEC) 20 MG capsule Take 20 mg by mouth daily.    . polyethylene glycol-electrolytes (NULYTELY/GOLYTELY) 420 G solution Take 4,000 mLs by mouth once. 4000 mL 0  . vitamin C (ASCORBIC ACID) 500 MG tablet Take 500 mg by mouth daily.      No results found for this or any previous visit (from the past 48 hour(s)). No results found.  ROS  Blood pressure 117/77, pulse 73, temperature 98.1 F (36.7 C), temperature source Oral, resp.  rate 17, height 5\' 6"  (1.676 m), weight 148 lb (67.132 kg), SpO2 97 %. Physical Exam  Constitutional: He is oriented to person, place, and time. He appears well-developed and well-nourished.  HENT:  Mouth/Throat: Oropharynx is clear and moist.  Eyes: Conjunctivae are normal. No scleral icterus.  Neck: No thyromegaly present.  Cardiovascular: Normal rate, regular rhythm and normal heart sounds.   No murmur heard. Respiratory: Effort normal and breath sounds normal.  GI: Soft. He exhibits no distension and no mass. There is no tenderness.  Musculoskeletal: He exhibits no edema.  Lymphadenopathy:    He has no cervical adenopathy.  Neurological: He is alert and oriented to person, place, and time.  Skin: Skin is warm and dry.      Assessment/Plan Chronic diarrhea. Diagnostic colonoscopy.  Jaryn Rosko U 11/14/2014, 8:35 AM

## 2014-11-14 NOTE — Discharge Instructions (Signed)
Resume usual medications and high fiber diet. Benefiber 4 g by mouth daily at bedtime. No driving for 24 hours. Physician will call with biopsy results  Colonoscopy, Care After Refer to this sheet in the next few weeks. These instructions provide you with information on caring for yourself after your procedure. Your health care provider may also give you more specific instructions. Your treatment has been planned according to current medical practices, but problems sometimes occur. Call your health care provider if you have any problems or questions after your procedure. WHAT TO EXPECT AFTER THE PROCEDURE  After your procedure, it is typical to have the following:  A small amount of blood in your stool.  Moderate amounts of gas and mild abdominal cramping or bloating. HOME CARE INSTRUCTIONS  Do not drive, operate machinery, or sign important documents for 24 hours.  You may shower and resume your regular physical activities, but move at a slower pace for the first 24 hours.  Take frequent rest periods for the first 24 hours.  Walk around or put a warm pack on your abdomen to help reduce abdominal cramping and bloating.  Drink enough fluids to keep your urine clear or pale yellow.  You may resume your normal diet as instructed by your health care provider. Avoid heavy or fried foods that are hard to digest.  Avoid drinking alcohol for 24 hours or as instructed by your health care provider.  Only take over-the-counter or prescription medicines as directed by your health care provider.  If a tissue sample (biopsy) was taken during your procedure:  Do not take aspirin or blood thinners for 7 days, or as instructed by your health care provider.  Do not drink alcohol for 7 days, or as instructed by your health care provider.  Eat soft foods for the first 24 hours. SEEK MEDICAL CARE IF: You have persistent spotting of blood in your stool 2-3 days after the procedure. SEEK IMMEDIATE  MEDICAL CARE IF:  You have more than a small spotting of blood in your stool.  You pass large blood clots in your stool.  Your abdomen is swollen (distended).  You have nausea or vomiting.  You have a fever.  You have increasing abdominal pain that is not relieved with medicine. Document Released: 12/01/2003 Document Revised: 02/06/2013 Document Reviewed: 12/24/2012 [facility] Patient Information 2015 [place], [place]. This information is not intended to replace advice given to you by your health care provider. Make sure you discuss any questions you have with your health care provider.  High-Fiber Diet Fiber is found in fruits, vegetables, and grains. A high-fiber diet encourages the addition of more whole grains, legumes, fruits, and vegetables in your diet. The recommended amount of fiber for adult males is 38 g per day. For adult females, it is 25 g per day. Pregnant and lactating women should get 28 g of fiber per day. If you have a digestive or bowel problem, ask your caregiver for advice before adding high-fiber foods to your diet. Eat a variety of high-fiber foods instead of only a select few type of foods.  PURPOSE  To increase stool bulk.  To make bowel movements more regular to prevent constipation.  To lower cholesterol.  To prevent overeating. WHEN IS THIS DIET USED?  It may be used if you have constipation and hemorrhoids.  It may be used if you have uncomplicated diverticulosis (intestine condition) and irritable bowel syndrome.  It may be used if you need help with weight management.  It may be used if you want to add it to your diet as a protective measure against atherosclerosis, diabetes, and cancer. SOURCES OF FIBER  Whole-grain breads and cereals.  Fruits, such as apples, oranges, bananas, berries, prunes, and pears.  Vegetables, such as green peas, carrots, sweet potatoes, beets, broccoli, cabbage, spinach, and artichokes.  Legumes, such split peas, soy,  lentils.  Almonds. FIBER CONTENT IN FOODS Starches and Grains / Dietary Fiber (g)  Cheerios, 1 cup / 3 g  Corn Flakes cereal, 1 cup / 0.7 g  Rice crispy treat cereal, 1 cup / 0.3 g  Instant oatmeal (cooked),  cup / 2 g  Frosted wheat cereal, 1 cup / 5.1 g  Brown, long-grain rice (cooked), 1 cup / 3.5 g  White, long-grain rice (cooked), 1 cup / 0.6 g  Enriched macaroni (cooked), 1 cup / 2.5 g Legumes / Dietary Fiber (g)  Baked beans (canned, plain, or vegetarian),  cup / 5.2 g  Kidney beans (canned),  cup / 6.8 g  Pinto beans (cooked),  cup / 5.5 g Breads and Crackers / Dietary Fiber (g)  Plain or honey graham crackers, 2 squares / 0.7 g  Saltine crackers, 3 squares / 0.3 g  Plain, salted pretzels, 10 pieces / 1.8 g  Whole-wheat bread, 1 slice / 1.9 g  White bread, 1 slice / 0.7 g  Raisin bread, 1 slice / 1.2 g  Plain bagel, 3 oz / 2 g  Flour tortilla, 1 oz / 0.9 g  Corn tortilla, 1 small / 1.5 g  Hamburger or hotdog bun, 1 small / 0.9 g Fruits / Dietary Fiber (g)  Apple with skin, 1 medium / 4.4 g  Sweetened applesauce,  cup / 1.5 g  Banana,  medium / 1.5 g  Grapes, 10 grapes / 0.4 g  Orange, 1 small / 2.3 g  Raisin, 1.5 oz / 1.6 g  Melon, 1 cup / 1.4 g Vegetables / Dietary Fiber (g)  Green beans (canned),  cup / 1.3 g  Carrots (cooked),  cup / 2.3 g  Broccoli (cooked),  cup / 2.8 g  Peas (cooked),  cup / 4.4 g  Mashed potatoes,  cup / 1.6 g  Lettuce, 1 cup / 0.5 g  Corn (canned),  cup / 1.6 g  Tomato,  cup / 1.1 g Document Released: 04/18/2005 Document Revised: 10/18/2011 Document Reviewed: 07/21/2011 [employer] Patient Information 2015 [place], [place]. This information is not intended to replace advice given to you by your health care provider. Make sure you discuss any questions you have with your health care provider.

## 2014-11-17 ENCOUNTER — Encounter (HOSPITAL_COMMUNITY): Payer: Self-pay | Admitting: Internal Medicine

## 2014-11-18 ENCOUNTER — Telehealth (HOSPITAL_COMMUNITY): Payer: Self-pay

## 2014-11-19 ENCOUNTER — Ambulatory Visit (HOSPITAL_COMMUNITY): Payer: Self-pay | Admitting: Psychiatry

## 2014-11-26 ENCOUNTER — Encounter (INDEPENDENT_AMBULATORY_CARE_PROVIDER_SITE_OTHER): Payer: Self-pay | Admitting: *Deleted

## 2014-12-08 ENCOUNTER — Ambulatory Visit (HOSPITAL_COMMUNITY): Payer: Self-pay | Admitting: Psychiatry

## 2014-12-25 ENCOUNTER — Encounter (HOSPITAL_COMMUNITY): Payer: Self-pay | Admitting: Psychiatry

## 2014-12-25 ENCOUNTER — Ambulatory Visit (HOSPITAL_COMMUNITY): Payer: Self-pay | Admitting: Psychiatry

## 2014-12-25 ENCOUNTER — Telehealth (HOSPITAL_COMMUNITY): Payer: Self-pay | Admitting: *Deleted

## 2015-01-01 ENCOUNTER — Ambulatory Visit (INDEPENDENT_AMBULATORY_CARE_PROVIDER_SITE_OTHER): Payer: Medicare Other | Admitting: Psychiatry

## 2015-01-01 ENCOUNTER — Telehealth (HOSPITAL_COMMUNITY): Payer: Self-pay | Admitting: *Deleted

## 2015-01-01 ENCOUNTER — Encounter (HOSPITAL_COMMUNITY): Payer: Self-pay | Admitting: Psychiatry

## 2015-01-01 VITALS — BP 138/83 | HR 72 | Ht 66.0 in | Wt 140.0 lb

## 2015-01-01 DIAGNOSIS — F3160 Bipolar disorder, current episode mixed, unspecified: Secondary | ICD-10-CM | POA: Diagnosis not present

## 2015-01-01 DIAGNOSIS — F3130 Bipolar disorder, current episode depressed, mild or moderate severity, unspecified: Secondary | ICD-10-CM

## 2015-01-01 MED ORDER — LAMOTRIGINE 200 MG PO TABS: 300.0000 mg | ORAL_TABLET | Freq: Every day | ORAL | Status: DC

## 2015-01-01 MED ORDER — GABAPENTIN 100 MG PO CAPS: 200.0000 mg | ORAL_CAPSULE | Freq: Four times a day (QID) | ORAL | Status: DC

## 2015-01-01 MED ORDER — CLONAZEPAM 1 MG PO TABS: 1.0000 mg | ORAL_TABLET | Freq: Four times a day (QID) | ORAL | Status: DC

## 2015-01-01 MED ORDER — GABAPENTIN 100 MG PO CAPS: 200.0000 mg | ORAL_CAPSULE | Freq: Three times a day (TID) | ORAL | Status: DC

## 2015-01-01 MED ORDER — LITHIUM CARBONATE ER 450 MG PO TBCR: 450.0000 mg | EXTENDED_RELEASE_TABLET | Freq: Two times a day (BID) | ORAL | Status: DC
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
## 2015-01-09 ENCOUNTER — Telehealth (HOSPITAL_COMMUNITY): Payer: Self-pay | Admitting: *Deleted

## 2015-01-09 NOTE — Telephone Encounter (Signed)
i don't seen anything in there yet from those dates

## 2015-01-09 NOTE — Telephone Encounter (Signed)
Pt called stating he would like to update Dr. Harrington Challenger to go into his file to look at his hospital notes when he went to [facility] recently (Sunday 4-5, 2016). He just want Dr. Harrington Challenger to know and be updated. Pt number is [phone].Marland Kitchen Per pt he will still come to his appt on the 30th of this month.

## 2015-01-13 NOTE — Telephone Encounter (Signed)
Informed pt and he showed understanding 

## 2015-01-23 ENCOUNTER — Ambulatory Visit (HOSPITAL_COMMUNITY): Payer: Self-pay | Admitting: Psychiatry

## 2015-01-30 ENCOUNTER — Ambulatory Visit (HOSPITAL_COMMUNITY): Payer: Self-pay | Admitting: Psychiatry

## 2015-02-10 ENCOUNTER — Encounter (HOSPITAL_COMMUNITY): Payer: Self-pay | Admitting: Psychiatry

## 2015-02-10 ENCOUNTER — Ambulatory Visit (INDEPENDENT_AMBULATORY_CARE_PROVIDER_SITE_OTHER): Payer: Medicare Other | Admitting: Psychiatry

## 2015-02-10 VITALS — BP 121/77 | HR 93 | Ht 66.0 in | Wt 142.0 lb

## 2015-02-10 DIAGNOSIS — F3130 Bipolar disorder, current episode depressed, mild or moderate severity, unspecified: Secondary | ICD-10-CM | POA: Diagnosis not present
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
## 2015-04-10 ENCOUNTER — Encounter (HOSPITAL_COMMUNITY): Payer: Self-pay | Admitting: Psychiatry

## 2015-04-10 ENCOUNTER — Ambulatory Visit (INDEPENDENT_AMBULATORY_CARE_PROVIDER_SITE_OTHER): Payer: Medicare Other | Admitting: Psychiatry

## 2015-04-10 VITALS — BP 123/76 | HR 86 | Ht 66.0 in | Wt 145.6 lb

## 2015-04-10 DIAGNOSIS — F3130 Bipolar disorder, current episode depressed, mild or moderate severity, unspecified: Secondary | ICD-10-CM

## 2015-04-10 MED ORDER — GABAPENTIN 100 MG PO CAPS: 200.0000 mg | ORAL_CAPSULE | Freq: Four times a day (QID) | ORAL | Status: DC

## 2015-04-10 MED ORDER — LITHIUM CARBONATE ER 450 MG PO TBCR: 450.0000 mg | EXTENDED_RELEASE_TABLET | Freq: Two times a day (BID) | ORAL | Status: DC

## 2015-04-10 MED ORDER — CLONAZEPAM 1 MG PO TABS: 1.0000 mg | ORAL_TABLET | Freq: Four times a day (QID) | ORAL | Status: DC

## 2015-04-10 MED ORDER — ESCITALOPRAM OXALATE 10 MG PO TABS: 10.0000 mg | ORAL_TABLET | Freq: Every day | ORAL | Status: DC

## 2015-04-10 MED ORDER — LAMOTRIGINE 200 MG PO TABS: 300.0000 mg | ORAL_TABLET | Freq: Every day | ORAL | Status: DC
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
## 2015-04-20 ENCOUNTER — Telehealth (HOSPITAL_COMMUNITY): Payer: Self-pay | Admitting: *Deleted

## 2015-04-20 NOTE — Telephone Encounter (Signed)
phone call from patient, he had to stop the Lexapro because of keeping him awake, shake really bad, sexual effects.   He said he will see you in Jan. unless you want to do something before then.

## 2015-04-21 NOTE — Telephone Encounter (Signed)
Pt called stating he stopped his Lexapro because it's keeping him awake, making him shake really bad and causing sexual effects. Per pt, he will be here for his appt in Jan unless Dr. Harrington Challenger wants him to do something new before then. Pt number is [phone]

## 2015-05-13 ENCOUNTER — Encounter (HOSPITAL_COMMUNITY): Payer: Self-pay | Admitting: Psychiatry

## 2015-05-13 ENCOUNTER — Ambulatory Visit (INDEPENDENT_AMBULATORY_CARE_PROVIDER_SITE_OTHER): Payer: Medicare Other | Admitting: Psychiatry

## 2015-05-13 VITALS — BP 138/84 | HR 93 | Ht 66.0 in | Wt 148.8 lb

## 2015-05-13 DIAGNOSIS — F3130 Bipolar disorder, current episode depressed, mild or moderate severity, unspecified: Secondary | ICD-10-CM

## 2015-05-13 MED ORDER — GABAPENTIN 100 MG PO CAPS: 200.0000 mg | ORAL_CAPSULE | Freq: Four times a day (QID) | ORAL | Status: DC

## 2015-05-13 MED ORDER — ALPRAZOLAM 1 MG PO TABS: 1.0000 mg | ORAL_TABLET | Freq: Four times a day (QID) | ORAL | Status: DC

## 2015-05-13 MED ORDER — LAMOTRIGINE 200 MG PO TABS: 200.0000 mg | ORAL_TABLET | Freq: Two times a day (BID) | ORAL | Status: DC

## 2015-05-13 MED ORDER — LITHIUM CARBONATE ER 450 MG PO TBCR: 450.0000 mg | EXTENDED_RELEASE_TABLET | Freq: Two times a day (BID) | ORAL | Status: DC
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
## 2015-05-26 ENCOUNTER — Telehealth (HOSPITAL_COMMUNITY): Payer: Self-pay | Admitting: *Deleted

## 2015-05-26 NOTE — Telephone Encounter (Signed)
Please ask octavia to call him. His last lithium level was normal at 0.8.He will need to call his PCP about these symptoms

## 2015-05-26 NOTE — Telephone Encounter (Signed)
The only level i can find was at [facility] 0.8 dated 04/24/15. I can't find anything more recent, he will need to have it sent here

## 2015-05-26 NOTE — Telephone Encounter (Signed)
Informed pt with what provider stated. Per pt, he just wanted to let provider know that his most recent lab test which was done on 05-2015. Per pt, labs should be avaliable to office and would to know if he should do anything with his lithium. Per pt, he have an appt with provider on Feb 6th. Pt number is [phone]

## 2015-05-26 NOTE — Telephone Encounter (Signed)
Phone call from patient regarding his Lithium, the level came back low, he has vision episodes, he goes just about blind.   Blurry, then his vision goes double, legs wobbly.  if he lay down it goes away.

## 2015-06-01 NOTE — Telephone Encounter (Signed)
Informed pt of what Dr. Harrington Challenger stated. Per pt he went to [facility] but they released him for another reason. Asked pt if he could have [facility] Discharge notes faxed to office and pt agreed. Scheduled pt a sooner appt to discuss what have been going on with him with provider.

## 2015-06-02 ENCOUNTER — Ambulatory Visit (INDEPENDENT_AMBULATORY_CARE_PROVIDER_SITE_OTHER): Payer: Medicare Other | Admitting: Psychiatry

## 2015-06-02 ENCOUNTER — Encounter (HOSPITAL_COMMUNITY): Payer: Self-pay | Admitting: Psychiatry

## 2015-06-02 VITALS — BP 135/75 | HR 66 | Ht 66.0 in | Wt 149.6 lb

## 2015-06-02 DIAGNOSIS — F3130 Bipolar disorder, current episode depressed, mild or moderate severity, unspecified: Secondary | ICD-10-CM | POA: Diagnosis not present
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
## 2015-06-08 ENCOUNTER — Other Ambulatory Visit (HOSPITAL_COMMUNITY): Payer: Self-pay | Admitting: Psychiatry

## 2015-06-08 ENCOUNTER — Telehealth (HOSPITAL_COMMUNITY): Payer: Self-pay | Admitting: *Deleted

## 2015-06-08 ENCOUNTER — Ambulatory Visit (HOSPITAL_COMMUNITY): Payer: Self-pay | Admitting: Psychiatry

## 2015-06-08 MED ORDER — LAMOTRIGINE 200 MG PO TABS: 200.0000 mg | ORAL_TABLET | Freq: Three times a day (TID) | ORAL | Status: DC

## 2015-06-08 NOTE — Telephone Encounter (Signed)
Please call pt to see if he is ok. He may need to come off lithium if the level got too high again

## 2015-06-08 NOTE — Telephone Encounter (Signed)
Phone call from patient said his mother called on Sat. 06/06/15 from ER and complained that no one called him back.     He said he was having episodes of crosseyed, triple vision, bad tremor in right arm and hand, right leg, head shaking, started in August.   Lithium up and down.  labs at ER were [facility].  patient wants to know if he can stop the Lithium and start new med.   He said he was having another episode and it didn't happen until he took his meds.   He dropped hs Xanax, he is missing 19 of these.   He asked for a refill of Xanax.

## 2015-06-08 NOTE — Telephone Encounter (Signed)
Told him to taper off lithium, will increase lamictal to 200 mg tid. I told him we cannot fill controlled drugs like xanax early

## 2015-06-08 NOTE — Telephone Encounter (Signed)
voice message from patient's mother on 06/06/15, Deadrick Verdugo said patient was at [facility] ER with episodes he discussed with Dr. Harrington Challenger.

## 2015-06-12 ENCOUNTER — Telehealth (HOSPITAL_COMMUNITY): Payer: Self-pay | Admitting: *Deleted

## 2015-06-12 NOTE — Telephone Encounter (Signed)
Please ask Harle Battiest to call him to explain

## 2015-06-12 NOTE — Telephone Encounter (Signed)
voice message from patient, said he has been hospitalized again.   Wanted Dr. Harrington Challenger to know.

## 2015-06-12 NOTE — Telephone Encounter (Signed)
noted 

## 2015-06-12 NOTE — Telephone Encounter (Signed)
phone call from patient, was hospitalized again.  spoke about a step down on Lithium.

## 2015-06-15 NOTE — Telephone Encounter (Signed)
Per pt, they checked his lithium at [facility] and it was 0.5 but he was having episodes. Per pt, the doctor at [place] his levels keep going up and down and was sent to [facility]. The doctor there at [place] (Dr. Kurtis Bushman) and him talked about coming off his Lithium. Per pt, he is tapering off the Lithium to see how he does without it. Per pt, by Saturday this week (Feb 18th) he should be off Lithium unless he is having problems. Per pt he would like to keep that appt he already have with Dr. Harrington Challenger for now unless he feels like he need to come in sooner. Pt cell number is [phone].

## 2015-06-16 NOTE — Telephone Encounter (Signed)
noted 

## 2015-06-17 ENCOUNTER — Telehealth (HOSPITAL_COMMUNITY): Payer: Self-pay | Admitting: *Deleted

## 2015-06-17 NOTE — Telephone Encounter (Signed)
Pt mother called around 1:55pm on 06-17-15. Per pt mother, pt is having really bad double vision and blindness and need his lithium to be stopped.

## 2015-06-18 ENCOUNTER — Telehealth (HOSPITAL_COMMUNITY): Payer: Self-pay | Admitting: *Deleted

## 2015-06-18 NOTE — Telephone Encounter (Signed)
Called Roger Duran to inform him of what Dr. Harrington Challenger stated about him needing f/u due to tapering off his Lithium. Tried to schedule another f/u appt for Roger Duran and he refused. Per Roger Duran, he understands that he needs f/u and he will but not not right now. Per Roger Duran, his PCP is currently looking into referring him to another psychiatric doctor and hopefully it will be around the time he is suppose to be off the Lithium. But he'll think about it and call back if he needs a f/u appt. Per Roger Duran' but for now, she do not want to resch appt. Roger Duran did apologize to [facility].M[facility]A about if she thought he came off some type of way. Per Roger Duran, he was just frustrated and upset.

## 2015-06-18 NOTE — Telephone Encounter (Signed)
I spoke to him at length yesterday after he had been rude to [place]. I explained that we would tolerate this behavior. He was again voicing visual and other side effects from lithium. I had previous told him on 06/08/15 to taper off lithium which claimed not to remember. I again told him yesterday to go down to one pill a day for 5 days and then d/c. If he doesn't come here he will need psychiatric follow up to see how he does off lithium

## 2015-06-18 NOTE — Telephone Encounter (Signed)
patient called, left voice message to cancel appointment on 06/23/15, no reason given.   Called patient back, no answer, left voice message.

## 2015-06-19 NOTE — Telephone Encounter (Signed)
noted 

## 2015-06-23 ENCOUNTER — Ambulatory Visit (HOSPITAL_COMMUNITY): Payer: Self-pay | Admitting: Psychiatry

## 2015-07-14 ENCOUNTER — Ambulatory Visit (HOSPITAL_COMMUNITY): Payer: Self-pay | Admitting: Psychiatry

## 2015-07-30 ENCOUNTER — Telehealth (HOSPITAL_COMMUNITY): Payer: Self-pay | Admitting: *Deleted

## 2015-07-30 NOTE — Telephone Encounter (Signed)
That will be fine. 
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
## 2015-07-31 NOTE — Telephone Encounter (Signed)
Spoke with pt and he made f/u appt

## 2015-07-31 NOTE — Telephone Encounter (Signed)
lmtcb number provided 

## 2015-08-20 ENCOUNTER — Emergency Department (HOSPITAL_COMMUNITY)
Admission: EM | Admit: 2015-08-20 | Discharge: 2015-08-20 | Disposition: A | Discharge status: Other Acute Inpatient Hospital | Payer: Medicare Other | Attending: Emergency Medicine | Admitting: Emergency Medicine

## 2015-08-20 ENCOUNTER — Encounter (HOSPITAL_COMMUNITY): Payer: Self-pay

## 2015-08-20 ENCOUNTER — Telehealth (HOSPITAL_COMMUNITY): Payer: Self-pay | Admitting: *Deleted

## 2015-08-20 ENCOUNTER — Observation Stay (HOSPITAL_COMMUNITY)
Admission: AD | Admit: 2015-08-20 | Discharge: 2015-08-20 | Disposition: A | Discharge status: Home / Self Care | Payer: Medicare Other | Source: Intra-hospital | Attending: Psychiatry | Admitting: Psychiatry

## 2015-08-20 ENCOUNTER — Encounter (HOSPITAL_COMMUNITY): Payer: Self-pay | Admitting: Emergency Medicine

## 2015-08-20 DIAGNOSIS — F17210 Nicotine dependence, cigarettes, uncomplicated: Secondary | ICD-10-CM | POA: Diagnosis not present

## 2015-08-20 DIAGNOSIS — F3181 Bipolar II disorder: Secondary | ICD-10-CM | POA: Insufficient documentation

## 2015-08-20 DIAGNOSIS — Z760 Encounter for issue of repeat prescription: Secondary | ICD-10-CM | POA: Diagnosis not present

## 2015-08-20 DIAGNOSIS — F332 Major depressive disorder, recurrent severe without psychotic features: Secondary | ICD-10-CM | POA: Diagnosis present

## 2015-08-20 DIAGNOSIS — Z5321 Procedure and treatment not carried out due to patient leaving prior to being seen by health care provider: Principal | ICD-10-CM | POA: Insufficient documentation

## 2015-08-20 LAB — CBC
HEMATOCRIT: 39.6 % (ref 39.0–52.0)
HEMOGLOBIN: 13.9 g/dL (ref 13.0–17.0)
MCH: 34.9 pg — ABNORMAL HIGH (ref 26.0–34.0)
MCHC: 35.1 g/dL (ref 30.0–36.0)
MCV: 99.5 fL (ref 78.0–100.0)
PLATELETS: 391 10*3/uL (ref 150–400)
RBC: 3.98 MIL/uL — AB (ref 4.22–5.81)
RDW: 14.6 % (ref 11.5–15.5)
WBC: 6.9 10*3/uL (ref 4.0–10.5)

## 2015-08-20 LAB — ACETAMINOPHEN LEVEL: Acetaminophen (Tylenol), Serum: <10 ug/mL — ABNORMAL LOW (ref 10–30)

## 2015-08-20 LAB — RAPID URINE DRUG SCREEN, HOSP PERFORMED
AMPHETAMINES: NOT DETECTED
BENZODIAZEPINES: POSITIVE — AB
Barbiturates: NOT DETECTED
Cocaine: NOT DETECTED
Opiates: NOT DETECTED
Tetrahydrocannabinol: NOT DETECTED

## 2015-08-20 LAB — COMPREHENSIVE METABOLIC PANEL
ALBUMIN: 4.5 g/dL (ref 3.5–5.0)
ALT: 30 U/L (ref 17–63)
AST: 29 U/L (ref 15–41)
Alkaline Phosphatase: 90 U/L (ref 38–126)
Anion gap: 9 (ref 5–15)
BUN: 7 mg/dL (ref 6–20)
CHLORIDE: 103 mmol/L (ref 101–111)
CO2: 30 mmol/L (ref 22–32)
Calcium: 9.5 mg/dL (ref 8.9–10.3)
Creatinine, Ser: 0.88 mg/dL (ref 0.61–1.24)
GFR calc Af Amer: >60 mL/min (ref >60)
GFR calc non Af Amer: >60 mL/min (ref >60)
GLUCOSE: 88 mg/dL (ref 65–99)
POTASSIUM: 4.2 mmol/L (ref 3.5–5.1)
Sodium: 142 mmol/L (ref 135–145)
Total Bilirubin: 0.7 mg/dL (ref 0.3–1.2)
Total Protein: 7.5 g/dL (ref 6.5–8.1)

## 2015-08-20 LAB — ETHANOL: Alcohol, Ethyl (B): <5 mg/dL (ref <5)

## 2015-08-20 LAB — SALICYLATE LEVEL: Salicylate Lvl: <4 mg/dL (ref 2.8–30.0)

## 2015-08-20 MED ORDER — NICOTINE 21 MG/24HR TD PT24
21.0000 mg | MEDICATED_PATCH | Freq: Every day | TRANSDERMAL | Status: DC
  Administered 2015-08-20: 21 mg via TRANSDERMAL
  Filled 2015-08-20: qty 1

## 2015-08-20 MED ORDER — LAMOTRIGINE 200 MG PO TABS: 200.0000 mg | ORAL_TABLET | Freq: Three times a day (TID) | ORAL | Status: DC

## 2015-08-20 MED ORDER — ACETAMINOPHEN 325 MG PO TABS: 650.0000 mg | ORAL_TABLET | Freq: Four times a day (QID) | ORAL | Status: DC | PRN

## 2015-08-20 MED ORDER — PANTOPRAZOLE SODIUM 40 MG PO TBEC: 40.0000 mg | DELAYED_RELEASE_TABLET | Freq: Every day | ORAL | Status: DC

## 2015-08-20 MED ORDER — FOLIC ACID 1 MG PO TABS
1.0000 mg | ORAL_TABLET | Freq: Every day | ORAL | Status: DC
  Administered 2015-08-20: 1 mg via ORAL
  Filled 2015-08-20: qty 1

## 2015-08-20 MED ORDER — LORAZEPAM 1 MG PO TABS
1.0000 mg | ORAL_TABLET | Freq: Four times a day (QID) | ORAL | Status: DC | PRN
Start: 2015-08-20 — End: 2015-08-21

## 2015-08-20 MED ORDER — CYCLOSPORINE 0.05 % OP EMUL
1.0000 [drp] | Freq: Two times a day (BID) | OPHTHALMIC | Status: DC
  Filled 2015-08-20 (×4): qty 1

## 2015-08-20 MED ORDER — MAGNESIUM HYDROXIDE 400 MG/5ML PO SUSP: 30.0000 mL | Freq: Every day | ORAL | Status: DC | PRN

## 2015-08-20 MED ORDER — TENOFOVIR DISOPROXIL FUMARATE 300 MG PO TABS
300.0000 mg | ORAL_TABLET | Freq: Every day | ORAL | Status: DC
  Filled 2015-08-20 (×2): qty 1

## 2015-08-20 MED ORDER — PANTOPRAZOLE SODIUM 40 MG PO TBEC
40.0000 mg | DELAYED_RELEASE_TABLET | Freq: Every day | ORAL | Status: DC
Start: 2015-08-20 — End: 2015-08-20
  Filled 2015-08-20: qty 1

## 2015-08-20 MED ORDER — ALUM & MAG HYDROXIDE-SIMETH 200-200-20 MG/5ML PO SUSP: 30.0000 mL | ORAL | Status: DC | PRN

## 2015-08-20 MED ORDER — ALPRAZOLAM 0.5 MG PO TABS
1.0000 mg | ORAL_TABLET | Freq: Four times a day (QID) | ORAL | Status: DC
  Administered 2015-08-20: 1 mg via ORAL
  Filled 2015-08-20: qty 2

## 2015-08-20 MED ORDER — ONDANSETRON 4 MG PO TBDP: 4.0000 mg | ORAL_TABLET | Freq: Four times a day (QID) | ORAL | Status: DC | PRN

## 2015-08-20 MED ORDER — LORATADINE 10 MG PO TABS: 10.0000 mg | ORAL_TABLET | Freq: Every day | ORAL | Status: DC

## 2015-08-20 MED ORDER — CYCLOSPORINE 0.05 % OP EMUL: 1.0000 [drp] | Freq: Two times a day (BID) | OPHTHALMIC | Status: DC

## 2015-08-20 MED ORDER — IBUPROFEN 200 MG PO TABS: 600.0000 mg | ORAL_TABLET | Freq: Three times a day (TID) | ORAL | Status: DC | PRN

## 2015-08-20 MED ORDER — LAMOTRIGINE 25 MG PO TABS: 25.0000 mg | ORAL_TABLET | Freq: Two times a day (BID) | ORAL | Status: DC

## 2015-08-20 MED ORDER — LOPERAMIDE HCL 2 MG PO CAPS: 2.0000 mg | ORAL_CAPSULE | ORAL | Status: DC | PRN

## 2015-08-20 MED ORDER — ACETAMINOPHEN 325 MG PO TABS: 650.0000 mg | ORAL_TABLET | ORAL | Status: DC | PRN

## 2015-08-20 MED ORDER — MELATONIN 10 MG PO TABS: 20.0000 mg | ORAL_TABLET | Freq: Every day | ORAL | Status: DC

## 2015-08-20 MED ORDER — ADULT MULTIVITAMIN W/MINERALS CH: 1.0000 | ORAL_TABLET | Freq: Every day | ORAL | Status: DC

## 2015-08-20 MED ORDER — ACYCLOVIR 400 MG PO TABS
400.0000 mg | ORAL_TABLET | Freq: Two times a day (BID) | ORAL | Status: DC
Start: 2015-08-20 — End: 2015-08-21
  Filled 2015-08-20 (×4): qty 1

## 2015-08-20 MED ORDER — GABAPENTIN 100 MG PO CAPS: 200.0000 mg | ORAL_CAPSULE | Freq: Three times a day (TID) | ORAL | Status: DC

## 2015-08-20 MED ORDER — LORAZEPAM 1 MG PO TABS: 1.0000 mg | ORAL_TABLET | Freq: Three times a day (TID) | ORAL | Status: DC | PRN

## 2015-08-20 MED ORDER — SENNOSIDES 8.6 MG PO TABS
1.0000 | ORAL_TABLET | ORAL | Status: DC | PRN
Start: 2015-08-20 — End: 2015-08-20

## 2015-08-20 MED ORDER — TENOFOVIR DISOPROXIL FUMARATE 300 MG PO TABS: 300.0000 mg | ORAL_TABLET | Freq: Every day | ORAL | Status: DC

## 2015-08-20 MED ORDER — BENEFIBER DRINK MIX PO PACK: 4.0000 g | PACK | Freq: Every day | ORAL | Status: DC

## 2015-08-20 MED ORDER — HYDROXYZINE HCL 25 MG PO TABS: 25.0000 mg | ORAL_TABLET | Freq: Four times a day (QID) | ORAL | Status: DC | PRN

## 2015-08-20 MED ORDER — ONDANSETRON HCL 4 MG PO TABS: 4.0000 mg | ORAL_TABLET | Freq: Three times a day (TID) | ORAL | Status: DC | PRN

## 2015-08-20 MED ORDER — GABAPENTIN 100 MG PO CAPS: 200.0000 mg | ORAL_CAPSULE | Freq: Four times a day (QID) | ORAL | Status: DC

## 2015-08-20 MED ORDER — LORATADINE 10 MG PO TABS
10.0000 mg | ORAL_TABLET | Freq: Every day | ORAL | Status: DC
  Administered 2015-08-20: 10 mg via ORAL
  Filled 2015-08-20: qty 1

## 2015-08-20 NOTE — ED Notes (Signed)
Bed: [facility] Expected date:  Expected time:  Means of arrival:  Comments: 

## 2015-08-20 NOTE — BHH Counselor (Signed)
This Probation officer spoke with pt. Pt states he has current therapist and hx. Is same reported from patient as hx. In notes and MAR. Pt main concern is medication management. Pt is at present showing signs of cycling and elevated anxiety levels. Cylas Falzone K. Nash Shearer, LPC-A, [facility]  Counselor 08/20/2015 6:47 PM

## 2015-08-20 NOTE — Progress Notes (Signed)
Pt in chair at shift change.  Pt is loud and angry.  Pt states that "we are not managing his medication properly and that he has missed several scheduled medications".  Pt specifically wants Xanax medication which is not ordered for this patient.  Pt screaming and yelling on the phone with his mother and stating that he is leaving the hospital. [facility] consulted.  Pt given AMA form that he signed.  Pt given personal property back and pt has signed for his property. Pt dressed and now speaking again with his mother.  Pt is demanding that his mother come to pick him up.

## 2015-08-20 NOTE — ED Notes (Signed)
Pt states that he is bipolar and cannot see his psych doc until may 1.  He is now out of meds and wants to go to [facility] because he is having hallucinations of being in a "barrel roll".  Pt denies hearing or seeing anything that isn't there.  "i've been a damn nut case".  Pt states that he is NOT suicidal or homicidal.
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
## 2015-08-20 NOTE — BHH Counselor (Signed)
Pt became disgruntled and was insisting he receive his medication[Mostly mentioning Xanex]. [facility] nurse and RN oncoming shift helped intervene, and pt. Stated he wanted to be d/c. Pt receiving AMA forms and is currently on phone with mother.  Yardley Beltran K. Nash Shearer, LPC-A, [facility]  Counselor 08/20/2015 7:20 PM

## 2015-08-20 NOTE — Telephone Encounter (Signed)
Voice message from patient, said he has not had bipolar medicine and is not doing well.  He said he is checking himself in [place], just wanted Dr. Harrington Challenger to know.

## 2015-08-20 NOTE — ED Notes (Signed)
Report called and given to RN at [facility]. Will have pt wanded dressed out in scrubs and sent to [facility]. Continues to deny SI/HI.

## 2015-08-20 NOTE — BH Assessment (Addendum)
Patient accepted to [facility] (OBS unit) by Reginold Agent, NP. Patient accepted to OBS bed #1. Support paperwork completed. Patient is voluntary and [facility] will transport. Nursing report [phone].

## 2015-08-20 NOTE — Telephone Encounter (Signed)
Pt called and he stated he is at [facility] in the Triage processing

## 2015-08-20 NOTE — ED Provider Notes (Signed)
CSN: [id number]     Arrival date & time 08/20/15  0913 History   First MD Initiated Contact with Patient 08/20/15 1144     Chief Complaint  Patient presents with  . Bipolar   . Medication Refill     (Consider location/radiation/quality/duration/timing/severity/associated sxs/prior Treatment) HPI  45 year old male presents with a chief complaint of bipolar disorder. Patient states he has been taken off of his lithium for the past 2 months due to vision complaints for about one year. Division complaints are still present despite having multiple workups including seeing an ophthalmologist. However now he feels like his bipolar symptoms are getting worse. He feels like he is "going crazy". Patient denies being suicidal or homicidal although he does pray to God that his life or the bipolar will end. Patient cannot see his psychiatrist, Dr. Harrington Challenger, until May 1. He states he is not on any bipolar medicines, he is currently taking Lamictal and Neurontin. Patient talked to his psychologist today, who is also in [place], who told him to come to behavioral health and that he should be admitted. Denies any hallucinations. Has felt depressed and had intermittent panic attacks.   Past Medical History  Diagnosis Date  . Depression   . Anxiety   . Mania ([place])   . Psoriasis   . Bipolar 1 disorder [facility])    Past Surgical History  Procedure Laterality Date  . Appendectomy    . Tonsillectomy    . Wrist surgery    . Colonoscopy N/A 11/14/2014    Procedure: COLONOSCOPY;  Surgeon: Rogene Houston, MD;  Location: [facility] ENDO SUITE;  Service: Endoscopy;  Laterality: N/A;  11:10 - moved to 8:30 - Ann to notify   Family History  Problem Relation Age of Onset  . Hyperlipidemia Mother   . Heart disease Father   . Anxiety disorder Father   . Bipolar disorder Maternal Grandfather    Social History  Substance Use Topics  . Smoking status: Current Every Day Smoker -- 0.50 packs/day    Types: Cigarettes  .  Smokeless tobacco: None     Comment: Per pt he uses E-cigarettes  . Alcohol Use: 0.0 oz/week    0 Standard drinks or equivalent per week     Comment: social    Review of Systems  Psychiatric/Behavioral: Positive for dysphoric mood. Negative for suicidal ideas, hallucinations and self-injury. The patient is nervous/anxious.   All other systems reviewed and are negative.     Allergies  Penicillins  Home Medications   Prior to Admission medications   Medication Sig Start Date End Date Taking? Authorizing Provider  acyclovir (ZOVIRAX) 400 MG tablet Take 400 mg by mouth 2 (two) times daily.    Historical Provider, MD  ALPRAZolam Duanne Moron) 1 MG tablet Take 1 tablet (1 mg total) by mouth 4 (four) times daily. 05/13/15 05/12/16  Cloria Spring, MD  Cholecalciferol (VITAMIN D PO) Take 600 mg by mouth daily.    Historical Provider, MD  cycloSPORINE (RESTASIS) 0.05 % ophthalmic emulsion Place 1 drop into both eyes 2 (two) times daily.    Historical Provider, MD  folic acid (FOLVITE) 1 MG tablet Take 1 mg by mouth daily.    Historical Provider, MD  gabapentin (NEURONTIN) 100 MG capsule Take 2 capsules (200 mg total) by mouth 4 (four) times daily. 05/13/15   Cloria Spring, MD  lamoTRIgine (LAMICTAL) 200 MG tablet Take 1 tablet (200 mg total) by mouth 3 (three) times daily. 06/08/15   Su Ley  Harrington Challenger, MD  loratadine (CLARITIN) 10 MG tablet Take 10 mg by mouth daily.    Historical Provider, MD  Melatonin 10 MG TABS Take 20 mg by mouth at bedtime.    Historical Provider, MD  methotrexate 2.5 MG tablet Take by mouth. Taking 3 Tablets on Wednesday and 3 Tablets on Sunday    Historical Provider, MD  omeprazole (PRILOSEC) 20 MG capsule Take 20 mg by mouth daily.    Historical Provider, MD  Sennosides (SENOKOT PO) Take by mouth as needed.    Historical Provider, MD  tenofovir (VIREAD) 300 MG tablet Take 300 mg by mouth daily.    Historical Provider, MD  vitamin C (ASCORBIC ACID) 500 MG tablet Take 500 mg by  mouth daily.    Historical Provider, MD  Wheat Dextrin (BENEFIBER DRINK MIX) PACK Take 4 g by mouth at bedtime. 11/14/14   Rogene Houston, MD   BP 120/82 mmHg  Pulse 85  Temp(Src) 98.3 F (36.8 C) (Oral)  Resp 18  SpO2 100% Physical Exam  Constitutional: He is oriented to person, place, and time. He appears well-developed and well-nourished.  HENT:  Head: Normocephalic and atraumatic.  Right Ear: External ear normal.  Left Ear: External ear normal.  Nose: Nose normal.  Eyes: Right eye exhibits no discharge. Left eye exhibits no discharge.  Neck: Neck supple.  Cardiovascular: Normal rate, regular rhythm, normal heart sounds and intact distal pulses.   Pulmonary/Chest: Effort normal.  Abdominal: Soft. He exhibits no distension.  Musculoskeletal: He exhibits no edema.  Neurological: He is alert and oriented to person, place, and time.  Skin: Skin is warm and dry.  Psychiatric: His speech is tangential. He is not agitated, not aggressive and not actively hallucinating. Thought content is not paranoid and not delusional. He exhibits a depressed mood. He expresses no homicidal and no suicidal ideation.  Nursing note and vitals reviewed.   ED Course  Procedures (including critical care time) Labs Review Labs Reviewed  ACETAMINOPHEN LEVEL - Abnormal; Notable for the following:    Acetaminophen (Tylenol), Serum <10 (*)    All other components within normal limits  CBC - Abnormal; Notable for the following:    RBC 3.98 (*)    MCH 34.9 (*)    All other components within normal limits  COMPREHENSIVE METABOLIC PANEL  ETHANOL  SALICYLATE LEVEL  URINE RAPID DRUG SCREEN, HOSP PERFORMED    Imaging Review No results found. I have personally reviewed and evaluated these images and lab results as part of my medical decision-making.   EKG Interpretation None      MDM   Final diagnoses:  Bipolar 2 disorder [facility])    Patient is medically stable. Psych consulted, they will admit to  behavioral health.    Sherwood Gambler, MD 08/20/15 [phone]

## 2015-08-20 NOTE — Progress Notes (Signed)
Pt arrived without signed/held orders causing delays in care. Unable to reach accepting providers. Nursing staff encouraged to contact transferring provider for signed/held orders upon pt arrival yet this appears not to have occurred. Pt will receive signed/held orders by this provider when time permits.   Roger Duran, [place] 08/20/2015 6:30 PM
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
## 2015-08-20 NOTE — Progress Notes (Signed)
Pt escorted to lobby and has left with his mother.

## 2015-08-21 MED ORDER — ACYCLOVIR 200 MG PO CAPS
400.0000 mg | ORAL_CAPSULE | Freq: Two times a day (BID) | ORAL | Status: DC
  Filled 2015-08-21: qty 2

## 2015-08-24 NOTE — Telephone Encounter (Signed)
noted 

## 2015-08-24 NOTE — Telephone Encounter (Signed)
Spoke with pt on 08-20-15 and he stated that he is currently in [place] and would like to speak with Dr. Harrington Challenger about his medication. Informed pt that provider is out of office and due to him being in the ER currently, he should speak with them. Per pt, he do not think he can wait that long for his appt that is currently on file. Per pt, he is gong through the nurse evaluation currently. Informed pt to speak with staff at [facility] ED if he is having any problems but message will be sent to provider when she returns to office.

## 2015-08-31 ENCOUNTER — Encounter (HOSPITAL_COMMUNITY): Payer: Self-pay | Admitting: Psychiatry

## 2015-08-31 ENCOUNTER — Ambulatory Visit (INDEPENDENT_AMBULATORY_CARE_PROVIDER_SITE_OTHER): Payer: Medicare Other | Admitting: Psychiatry

## 2015-08-31 VITALS — BP 134/82 | HR 85 | Ht 66.0 in | Wt 150.6 lb

## 2015-08-31 DIAGNOSIS — F3130 Bipolar disorder, current episode depressed, mild or moderate severity, unspecified: Secondary | ICD-10-CM

## 2015-08-31 MED ORDER — LAMOTRIGINE 200 MG PO TABS: 200.0000 mg | ORAL_TABLET | Freq: Three times a day (TID) | ORAL | Status: DC

## 2015-08-31 MED ORDER — ALPRAZOLAM 2 MG PO TABS: 2.0000 mg | ORAL_TABLET | Freq: Three times a day (TID) | ORAL | Status: DC

## 2015-08-31 MED ORDER — LURASIDONE HCL 60 MG PO TABS: 60.0000 mg | ORAL_TABLET | Freq: Every day | ORAL | Status: DC

## 2015-08-31 MED ORDER — GABAPENTIN 100 MG PO CAPS: 200.0000 mg | ORAL_CAPSULE | Freq: Four times a day (QID) | ORAL | Status: DC
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
## 2015-09-30 ENCOUNTER — Encounter (HOSPITAL_COMMUNITY): Payer: Self-pay | Admitting: Psychiatry

## 2015-09-30 ENCOUNTER — Ambulatory Visit (INDEPENDENT_AMBULATORY_CARE_PROVIDER_SITE_OTHER): Payer: Medicare Other | Admitting: Psychiatry

## 2015-09-30 VITALS — BP 130/73 | HR 69 | Ht 66.0 in | Wt 147.8 lb

## 2015-09-30 DIAGNOSIS — F3130 Bipolar disorder, current episode depressed, mild or moderate severity, unspecified: Secondary | ICD-10-CM | POA: Diagnosis not present

## 2015-09-30 MED ORDER — GABAPENTIN 100 MG PO CAPS: 200.0000 mg | ORAL_CAPSULE | Freq: Four times a day (QID) | ORAL | Status: DC

## 2015-09-30 MED ORDER — LAMOTRIGINE 200 MG PO TABS: 200.0000 mg | ORAL_TABLET | Freq: Three times a day (TID) | ORAL | Status: DC

## 2015-09-30 MED ORDER — LURASIDONE HCL 60 MG PO TABS: 60.0000 mg | ORAL_TABLET | Freq: Every day | ORAL | Status: DC
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
## 2015-10-05 ENCOUNTER — Telehealth (HOSPITAL_COMMUNITY): Payer: Self-pay | Admitting: *Deleted

## 2015-10-05 NOTE — Telephone Encounter (Signed)
phone call from patient, the Latuda, he goes to bed around 1 or 2 in a.m.  He takes it just before midnight and it is still knocking him out, it's like a black out, he is falling asleep in the middle of a conversation. He said he woke up about 4:30 this morning.

## 2015-10-05 NOTE — Telephone Encounter (Signed)
Please ask Roger Duran to call him. Of it is knocking him out, why is he awake at 430 am?

## 2015-10-05 NOTE — Telephone Encounter (Signed)
Called pt number on file and his grandmother picked up. Asked if pt could please call office back and number was provided. She agreed.

## 2015-10-26 ENCOUNTER — Other Ambulatory Visit (HOSPITAL_COMMUNITY): Payer: Self-pay | Admitting: Psychiatry

## 2015-11-19 ENCOUNTER — Telehealth (HOSPITAL_COMMUNITY): Payer: Self-pay | Admitting: *Deleted

## 2015-11-19 NOTE — Telephone Encounter (Signed)
phone call from Dr. Raynald Kemp, patient is in her office.    patient called here on 10/05/15, noted in EPIC regarding his Latuda.  He is not sure if the 60 is too much.  He said he is way too manic, from the time he wakes up until he falls asleep watching tv.

## 2015-11-20 NOTE — Telephone Encounter (Signed)
Called pt to inform him of what Dr. Harrington Challenger stated. Called pt mobile in attempt to sch sooner appt with pt. Reminded pt that he have an appt with Dr. Harrington Challenger on July 31 and pt stated he might as well just wait until his appt to talk to Dr. Harrington Challenger about his medication changes. Per pt, he suffered this long he might as well just wait. Per pt, he just had a really bad day yesterday and he was manic and when that happens, he suffers that for the entire day.

## 2015-11-20 NOTE — Telephone Encounter (Signed)
He will have to come in to discuss med changes

## 2015-11-30 ENCOUNTER — Ambulatory Visit (INDEPENDENT_AMBULATORY_CARE_PROVIDER_SITE_OTHER): Payer: Medicare Other | Admitting: Psychiatry

## 2015-11-30 ENCOUNTER — Encounter (HOSPITAL_COMMUNITY): Payer: Self-pay | Admitting: Psychiatry

## 2015-11-30 VITALS — BP 140/82 | Ht 66.0 in | Wt 146.0 lb

## 2015-11-30 DIAGNOSIS — F3130 Bipolar disorder, current episode depressed, mild or moderate severity, unspecified: Secondary | ICD-10-CM | POA: Diagnosis not present

## 2015-11-30 MED ORDER — GABAPENTIN 100 MG PO CAPS: 200.0000 mg | ORAL_CAPSULE | Freq: Four times a day (QID) | ORAL | 2 refills | Status: DC

## 2015-11-30 MED ORDER — LURASIDONE HCL 40 MG PO TABS: 40.0000 mg | ORAL_TABLET | Freq: Every day | ORAL | 2 refills | Status: DC

## 2015-11-30 MED ORDER — ALPRAZOLAM 2 MG PO TABS: 2.0000 mg | ORAL_TABLET | Freq: Three times a day (TID) | ORAL | 2 refills | Status: DC
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
## 2016-01-03 IMAGING — NM NM HEPATO W/GB/PHARM/[PERSON_NAME]
2 series · 12 of 12 positions shown · non-contrast
Comparison: None.

CLINICAL DATA: Diarrhea.  Generalized abdominal pain.

EXAM:
NUCLEAR MEDICINE HEPATOBILIARY IMAGING WITH GALLBLADDER EF
TECHNIQUE: Sequential images of the abdomen were obtained [DATE] minutes
following intravenous administration of radiopharmaceutical. After
slow intravenous infusion of 1.36 micrograms Cholecystokinin,
gallbladder ejection fraction was determined.
RADIOPHARMACEUTICALS:  4.8 mCi Dechnetium-PPm Choletec IV

[Series 1: biliary · 3.25mm/px · 6 of 60 frames shown]
[frame 6/60]
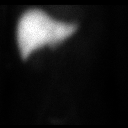
[frame 16/60]
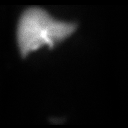
[frame 26/60]
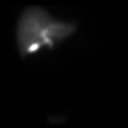
[frame 36/60]
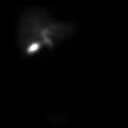
[frame 46/60]
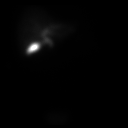
[frame 56/60]
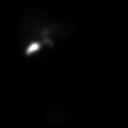

[Series 2: gbef · 3.25mm/px · 6 of 45 frames shown]
[frame 4/45]
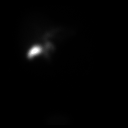
[frame 12/45]
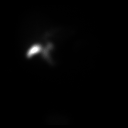
[frame 19/45]
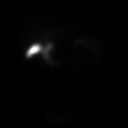
[frame 27/45]
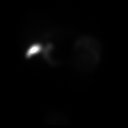
[frame 34/45]
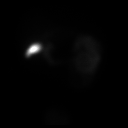
[frame 42/45]
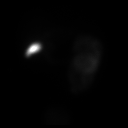

[12 of 12 positions shown; findings below may reference images not displayed]

FINDINGS: There is prompt uptake and excretion of radiotracer by the liver. No
evidence of cystic duct or common bile duct obstruction. Gallbladder
ejection fraction is low at 17%. At 45 min, normal ejection fraction
is greater than 40%.
IMPRESSION: Abnormally low gallbladder ejection fraction of 17%.

## 2016-01-05 ENCOUNTER — Ambulatory Visit (INDEPENDENT_AMBULATORY_CARE_PROVIDER_SITE_OTHER): Payer: Medicare Other | Admitting: Psychiatry

## 2016-01-05 ENCOUNTER — Encounter (HOSPITAL_COMMUNITY): Payer: Self-pay | Admitting: Psychiatry

## 2016-01-05 VITALS — BP 120/81 | Ht 66.0 in | Wt 151.0 lb

## 2016-01-05 DIAGNOSIS — F3130 Bipolar disorder, current episode depressed, mild or moderate severity, unspecified: Secondary | ICD-10-CM

## 2016-01-05 DIAGNOSIS — F3160 Bipolar disorder, current episode mixed, unspecified: Secondary | ICD-10-CM | POA: Diagnosis not present

## 2016-01-05 MED ORDER — GABAPENTIN 100 MG PO CAPS: 200.0000 mg | ORAL_CAPSULE | Freq: Four times a day (QID) | ORAL | 2 refills | Status: DC

## 2016-01-05 MED ORDER — LAMOTRIGINE 200 MG PO TABS: 200.0000 mg | ORAL_TABLET | Freq: Three times a day (TID) | ORAL | 2 refills | Status: DC

## 2016-01-05 MED ORDER — LURASIDONE HCL 40 MG PO TABS: 40.0000 mg | ORAL_TABLET | Freq: Every day | ORAL | 2 refills | Status: DC

## 2016-01-05 MED ORDER — ALPRAZOLAM 2 MG PO TABS: 2.0000 mg | ORAL_TABLET | Freq: Three times a day (TID) | ORAL | 2 refills | Status: DC
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
## 2016-04-05 ENCOUNTER — Ambulatory Visit (INDEPENDENT_AMBULATORY_CARE_PROVIDER_SITE_OTHER): Payer: Medicare Other | Admitting: Psychiatry

## 2016-04-05 ENCOUNTER — Encounter (HOSPITAL_COMMUNITY): Payer: Self-pay | Admitting: Psychiatry

## 2016-04-05 VITALS — BP 136/77 | HR 84 | Ht 66.0 in | Wt 146.0 lb

## 2016-04-05 DIAGNOSIS — Z880 Allergy status to penicillin: Secondary | ICD-10-CM

## 2016-04-05 DIAGNOSIS — Z79899 Other long term (current) drug therapy: Secondary | ICD-10-CM

## 2016-04-05 DIAGNOSIS — Z818 Family history of other mental and behavioral disorders: Secondary | ICD-10-CM

## 2016-04-05 DIAGNOSIS — Z8489 Family history of other specified conditions: Secondary | ICD-10-CM

## 2016-04-05 DIAGNOSIS — F3130 Bipolar disorder, current episode depressed, mild or moderate severity, unspecified: Secondary | ICD-10-CM

## 2016-04-05 DIAGNOSIS — Z8349 Family history of other endocrine, nutritional and metabolic diseases: Secondary | ICD-10-CM

## 2016-04-05 MED ORDER — ALPRAZOLAM 2 MG PO TABS: 2.0000 mg | ORAL_TABLET | Freq: Three times a day (TID) | ORAL | 2 refills | Status: DC

## 2016-04-05 MED ORDER — GABAPENTIN 300 MG PO CAPS: 300.0000 mg | ORAL_CAPSULE | Freq: Three times a day (TID) | ORAL | 2 refills | Status: DC

## 2016-04-05 MED ORDER — LAMOTRIGINE 200 MG PO TABS: 200.0000 mg | ORAL_TABLET | Freq: Two times a day (BID) | ORAL | 2 refills | Status: DC

## 2016-04-05 MED ORDER — LURASIDONE HCL 40 MG PO TABS: 40.0000 mg | ORAL_TABLET | Freq: Every day | ORAL | 2 refills | Status: DC
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
## 2016-06-02 ENCOUNTER — Encounter (HOSPITAL_COMMUNITY): Payer: Self-pay | Admitting: Psychiatry

## 2016-06-02 ENCOUNTER — Ambulatory Visit (INDEPENDENT_AMBULATORY_CARE_PROVIDER_SITE_OTHER): Payer: Medicare Other | Admitting: Psychiatry

## 2016-06-02 VITALS — BP 133/81 | HR 71 | Ht 66.0 in | Wt 144.6 lb

## 2016-06-02 DIAGNOSIS — Z818 Family history of other mental and behavioral disorders: Secondary | ICD-10-CM

## 2016-06-02 DIAGNOSIS — Z880 Allergy status to penicillin: Secondary | ICD-10-CM | POA: Diagnosis not present

## 2016-06-02 DIAGNOSIS — F3130 Bipolar disorder, current episode depressed, mild or moderate severity, unspecified: Secondary | ICD-10-CM | POA: Diagnosis not present

## 2016-06-02 DIAGNOSIS — Z8249 Family history of ischemic heart disease and other diseases of the circulatory system: Secondary | ICD-10-CM | POA: Diagnosis not present

## 2016-06-02 DIAGNOSIS — Z79899 Other long term (current) drug therapy: Secondary | ICD-10-CM | POA: Diagnosis not present

## 2016-06-02 MED ORDER — LAMOTRIGINE 200 MG PO TABS: 200.0000 mg | ORAL_TABLET | Freq: Two times a day (BID) | ORAL | 2 refills | Status: DC

## 2016-06-02 MED ORDER — LURASIDONE HCL 60 MG PO TABS: 60.0000 mg | ORAL_TABLET | Freq: Every day | ORAL | 2 refills | Status: DC

## 2016-06-02 MED ORDER — ALPRAZOLAM 2 MG PO TABS: 2.0000 mg | ORAL_TABLET | Freq: Three times a day (TID) | ORAL | 2 refills | Status: DC

## 2016-06-02 MED ORDER — GABAPENTIN 300 MG PO CAPS: 300.0000 mg | ORAL_CAPSULE | Freq: Three times a day (TID) | ORAL | 2 refills | Status: DC
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
## 2016-07-04 ENCOUNTER — Encounter (HOSPITAL_COMMUNITY): Payer: Self-pay | Admitting: Psychiatry

## 2016-07-04 ENCOUNTER — Ambulatory Visit (INDEPENDENT_AMBULATORY_CARE_PROVIDER_SITE_OTHER): Payer: Medicare Other | Admitting: Psychiatry

## 2016-07-04 VITALS — BP 120/78 | HR 77 | Ht 66.0 in | Wt 146.8 lb

## 2016-07-04 DIAGNOSIS — Z818 Family history of other mental and behavioral disorders: Secondary | ICD-10-CM | POA: Diagnosis not present

## 2016-07-04 DIAGNOSIS — Z79899 Other long term (current) drug therapy: Secondary | ICD-10-CM | POA: Diagnosis not present

## 2016-07-04 DIAGNOSIS — Z880 Allergy status to penicillin: Secondary | ICD-10-CM

## 2016-07-04 DIAGNOSIS — F3130 Bipolar disorder, current episode depressed, mild or moderate severity, unspecified: Secondary | ICD-10-CM | POA: Diagnosis not present

## 2016-07-04 MED ORDER — LURASIDONE HCL 60 MG PO TABS: 60.0000 mg | ORAL_TABLET | Freq: Every day | ORAL | 2 refills | Status: DC

## 2016-07-04 MED ORDER — ALPRAZOLAM 2 MG PO TABS: 2.0000 mg | ORAL_TABLET | Freq: Three times a day (TID) | ORAL | 2 refills | Status: DC

## 2016-07-04 MED ORDER — LAMOTRIGINE 200 MG PO TABS: 200.0000 mg | ORAL_TABLET | Freq: Two times a day (BID) | ORAL | 2 refills | Status: DC

## 2016-07-04 MED ORDER — GABAPENTIN 300 MG PO CAPS: 300.0000 mg | ORAL_CAPSULE | Freq: Three times a day (TID) | ORAL | 2 refills | Status: DC
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
## 2016-08-31 ENCOUNTER — Encounter (HOSPITAL_COMMUNITY): Payer: Self-pay | Admitting: Psychiatry

## 2016-08-31 ENCOUNTER — Ambulatory Visit (INDEPENDENT_AMBULATORY_CARE_PROVIDER_SITE_OTHER): Payer: Medicare Other | Admitting: Psychiatry

## 2016-08-31 VITALS — BP 140/95 | HR 75 | Ht 66.0 in | Wt 147.2 lb

## 2016-08-31 DIAGNOSIS — Z818 Family history of other mental and behavioral disorders: Secondary | ICD-10-CM | POA: Diagnosis not present

## 2016-08-31 DIAGNOSIS — F3130 Bipolar disorder, current episode depressed, mild or moderate severity, unspecified: Secondary | ICD-10-CM

## 2016-08-31 DIAGNOSIS — Z79899 Other long term (current) drug therapy: Secondary | ICD-10-CM | POA: Diagnosis not present

## 2016-08-31 MED ORDER — ALPRAZOLAM 2 MG PO TABS: 2.0000 mg | ORAL_TABLET | Freq: Three times a day (TID) | ORAL | 2 refills | Status: DC

## 2016-08-31 MED ORDER — ESCITALOPRAM OXALATE 10 MG PO TABS: 10.0000 mg | ORAL_TABLET | Freq: Every day | ORAL | 2 refills | Status: DC

## 2016-08-31 MED ORDER — LURASIDONE HCL 60 MG PO TABS: 60.0000 mg | ORAL_TABLET | Freq: Every day | ORAL | 2 refills | Status: DC

## 2016-08-31 MED ORDER — LAMOTRIGINE 200 MG PO TABS: 200.0000 mg | ORAL_TABLET | Freq: Two times a day (BID) | ORAL | 2 refills | Status: DC

## 2016-08-31 MED ORDER — GABAPENTIN 300 MG PO CAPS: 300.0000 mg | ORAL_CAPSULE | Freq: Three times a day (TID) | ORAL | 2 refills | Status: DC
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
## 2016-09-21 ENCOUNTER — Telehealth (HOSPITAL_COMMUNITY): Payer: Self-pay

## 2016-09-21 NOTE — Telephone Encounter (Signed)
Please call him, but he will probably need to come in

## 2016-09-22 NOTE — Telephone Encounter (Signed)
lmtcb

## 2016-09-22 NOTE — Telephone Encounter (Signed)
lmtcb on [phone] with grandmother

## 2016-09-23 ENCOUNTER — Ambulatory Visit (HOSPITAL_COMMUNITY): Payer: Medicare Other | Admitting: Psychiatry

## 2016-09-23 NOTE — Telephone Encounter (Signed)
Pt called office back and spoke with front staff on 09-22-2016. Staff informed pt with what provider stated. Staff offered pt with an appt for 09-23-2016 at 2:45 pm and pt agreed. Per pt chart, pt called back at 2:57 pm on 09-22-2016 and cancelled his appt for 09-23-2016 stating he has prior appts and was resch for 10-03-2016.

## 2016-10-03 ENCOUNTER — Ambulatory Visit (INDEPENDENT_AMBULATORY_CARE_PROVIDER_SITE_OTHER): Payer: Medicare Other | Admitting: Psychiatry

## 2016-10-03 ENCOUNTER — Encounter (HOSPITAL_COMMUNITY): Payer: Self-pay | Admitting: Psychiatry

## 2016-10-03 VITALS — BP 112/79 | HR 75 | Ht 66.0 in | Wt 149.0 lb

## 2016-10-03 DIAGNOSIS — F3130 Bipolar disorder, current episode depressed, mild or moderate severity, unspecified: Secondary | ICD-10-CM

## 2016-10-03 DIAGNOSIS — Z818 Family history of other mental and behavioral disorders: Secondary | ICD-10-CM

## 2016-10-03 MED ORDER — LAMOTRIGINE 200 MG PO TABS: 200.0000 mg | ORAL_TABLET | Freq: Two times a day (BID) | ORAL | 2 refills | Status: DC

## 2016-10-03 MED ORDER — LURASIDONE HCL 60 MG PO TABS: 60.0000 mg | ORAL_TABLET | Freq: Every day | ORAL | 2 refills | Status: DC

## 2016-10-03 MED ORDER — BUPROPION HCL 75 MG PO TABS: 75.0000 mg | ORAL_TABLET | ORAL | 2 refills | Status: DC
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
## 2016-12-05 ENCOUNTER — Encounter (HOSPITAL_COMMUNITY): Payer: Self-pay | Admitting: Psychiatry

## 2016-12-05 ENCOUNTER — Ambulatory Visit (INDEPENDENT_AMBULATORY_CARE_PROVIDER_SITE_OTHER): Payer: Medicare Other | Admitting: Psychiatry

## 2016-12-05 VITALS — BP 127/85 | HR 75 | Ht 66.0 in | Wt 149.0 lb

## 2016-12-05 DIAGNOSIS — Z818 Family history of other mental and behavioral disorders: Secondary | ICD-10-CM

## 2016-12-05 DIAGNOSIS — F3130 Bipolar disorder, current episode depressed, mild or moderate severity, unspecified: Secondary | ICD-10-CM | POA: Diagnosis not present

## 2016-12-05 MED ORDER — GABAPENTIN 300 MG PO CAPS: 300.0000 mg | ORAL_CAPSULE | Freq: Three times a day (TID) | ORAL | 2 refills | Status: DC

## 2016-12-05 MED ORDER — LAMOTRIGINE 200 MG PO TABS: 200.0000 mg | ORAL_TABLET | Freq: Two times a day (BID) | ORAL | 2 refills | Status: DC

## 2016-12-05 MED ORDER — BUPROPION HCL 75 MG PO TABS: 75.0000 mg | ORAL_TABLET | ORAL | 2 refills | Status: DC

## 2016-12-05 MED ORDER — ALPRAZOLAM 2 MG PO TABS: 2.0000 mg | ORAL_TABLET | Freq: Three times a day (TID) | ORAL | 2 refills | Status: DC

## 2016-12-05 MED ORDER — LURASIDONE HCL 80 MG PO TABS: 80.0000 mg | ORAL_TABLET | Freq: Every day | ORAL | 2 refills | Status: DC
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
## 2017-01-16 ENCOUNTER — Ambulatory Visit (INDEPENDENT_AMBULATORY_CARE_PROVIDER_SITE_OTHER): Payer: Medicare Other | Admitting: Psychiatry

## 2017-01-16 ENCOUNTER — Encounter (HOSPITAL_COMMUNITY): Payer: Self-pay | Admitting: Psychiatry

## 2017-01-16 VITALS — BP 135/79 | HR 68 | Ht 66.0 in | Wt 148.0 lb

## 2017-01-16 DIAGNOSIS — F419 Anxiety disorder, unspecified: Secondary | ICD-10-CM

## 2017-01-16 DIAGNOSIS — F17200 Nicotine dependence, unspecified, uncomplicated: Secondary | ICD-10-CM | POA: Diagnosis not present

## 2017-01-16 DIAGNOSIS — F3130 Bipolar disorder, current episode depressed, mild or moderate severity, unspecified: Secondary | ICD-10-CM

## 2017-01-16 DIAGNOSIS — F3160 Bipolar disorder, current episode mixed, unspecified: Secondary | ICD-10-CM

## 2017-01-16 DIAGNOSIS — Z79899 Other long term (current) drug therapy: Secondary | ICD-10-CM | POA: Diagnosis not present

## 2017-01-16 MED ORDER — LURASIDONE HCL 80 MG PO TABS: 80.0000 mg | ORAL_TABLET | Freq: Every day | ORAL | 2 refills | Status: DC

## 2017-01-16 MED ORDER — LAMOTRIGINE 200 MG PO TABS: 200.0000 mg | ORAL_TABLET | Freq: Two times a day (BID) | ORAL | 2 refills | Status: DC

## 2017-01-16 MED ORDER — ALPRAZOLAM 2 MG PO TABS: 2.0000 mg | ORAL_TABLET | Freq: Three times a day (TID) | ORAL | 2 refills | Status: DC

## 2017-01-16 MED ORDER — BUPROPION HCL 75 MG PO TABS: 75.0000 mg | ORAL_TABLET | ORAL | 2 refills | Status: DC

## 2017-01-16 MED ORDER — GABAPENTIN 300 MG PO CAPS: 300.0000 mg | ORAL_CAPSULE | Freq: Three times a day (TID) | ORAL | 2 refills | Status: DC
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
## 2017-03-03 ENCOUNTER — Ambulatory Visit (INDEPENDENT_AMBULATORY_CARE_PROVIDER_SITE_OTHER): Payer: Medicare Other | Admitting: Psychiatry

## 2017-03-03 ENCOUNTER — Encounter (HOSPITAL_COMMUNITY): Payer: Self-pay | Admitting: Psychiatry

## 2017-03-03 VITALS — BP 132/89 | HR 65 | Ht 66.0 in | Wt 149.0 lb

## 2017-03-03 DIAGNOSIS — R5383 Other fatigue: Secondary | ICD-10-CM

## 2017-03-03 DIAGNOSIS — R5381 Other malaise: Secondary | ICD-10-CM

## 2017-03-03 DIAGNOSIS — Z818 Family history of other mental and behavioral disorders: Secondary | ICD-10-CM | POA: Diagnosis not present

## 2017-03-03 DIAGNOSIS — Z736 Limitation of activities due to disability: Secondary | ICD-10-CM | POA: Diagnosis not present

## 2017-03-03 DIAGNOSIS — F17210 Nicotine dependence, cigarettes, uncomplicated: Secondary | ICD-10-CM | POA: Diagnosis not present

## 2017-03-03 DIAGNOSIS — Z62811 Personal history of psychological abuse in childhood: Secondary | ICD-10-CM

## 2017-03-03 DIAGNOSIS — F3130 Bipolar disorder, current episode depressed, mild or moderate severity, unspecified: Secondary | ICD-10-CM

## 2017-03-03 MED ORDER — GABAPENTIN 300 MG PO CAPS: 300.0000 mg | ORAL_CAPSULE | Freq: Three times a day (TID) | ORAL | 2 refills | Status: DC

## 2017-03-03 MED ORDER — LAMOTRIGINE 200 MG PO TABS: 200.0000 mg | ORAL_TABLET | Freq: Two times a day (BID) | ORAL | 2 refills | Status: DC

## 2017-03-03 MED ORDER — LURASIDONE HCL 80 MG PO TABS: 80.0000 mg | ORAL_TABLET | Freq: Every day | ORAL | 2 refills | Status: DC

## 2017-03-03 MED ORDER — BUPROPION HCL 75 MG PO TABS: 75.0000 mg | ORAL_TABLET | ORAL | 2 refills | Status: DC

## 2017-03-03 MED ORDER — ALPRAZOLAM 2 MG PO TABS: 2.0000 mg | ORAL_TABLET | Freq: Three times a day (TID) | ORAL | 2 refills | Status: DC
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
## 2017-05-04 ENCOUNTER — Ambulatory Visit (HOSPITAL_COMMUNITY): Payer: Medicare Other | Admitting: Psychiatry

## 2017-05-09 ENCOUNTER — Encounter (HOSPITAL_COMMUNITY): Payer: Self-pay | Admitting: Psychiatry

## 2017-05-09 ENCOUNTER — Ambulatory Visit (HOSPITAL_COMMUNITY): Payer: Medicare Other | Admitting: Psychiatry

## 2017-05-09 VITALS — BP 129/86 | HR 127 | Ht 66.0 in | Wt 149.0 lb

## 2017-05-09 DIAGNOSIS — F17210 Nicotine dependence, cigarettes, uncomplicated: Secondary | ICD-10-CM

## 2017-05-09 DIAGNOSIS — Z736 Limitation of activities due to disability: Secondary | ICD-10-CM | POA: Diagnosis not present

## 2017-05-09 DIAGNOSIS — F3130 Bipolar disorder, current episode depressed, mild or moderate severity, unspecified: Secondary | ICD-10-CM | POA: Diagnosis not present

## 2017-05-09 DIAGNOSIS — G4700 Insomnia, unspecified: Secondary | ICD-10-CM

## 2017-05-09 DIAGNOSIS — Z818 Family history of other mental and behavioral disorders: Secondary | ICD-10-CM

## 2017-05-09 MED ORDER — ZOLPIDEM TARTRATE 10 MG PO TABS
10.0000 mg | ORAL_TABLET | Freq: Every evening | ORAL | 2 refills | Status: DC | PRN
Start: 2017-05-09 — End: 2017-09-12

## 2017-05-09 MED ORDER — LAMOTRIGINE 200 MG PO TABS: 200.0000 mg | ORAL_TABLET | Freq: Two times a day (BID) | ORAL | 2 refills | Status: DC

## 2017-05-09 MED ORDER — BUPROPION HCL 75 MG PO TABS: 75.0000 mg | ORAL_TABLET | ORAL | 2 refills | Status: DC

## 2017-05-09 MED ORDER — LURASIDONE HCL 80 MG PO TABS
80.0000 mg | ORAL_TABLET | Freq: Every day | ORAL | 2 refills | Status: DC
Start: 2017-05-09 — End: 2017-07-10

## 2017-05-09 MED ORDER — GABAPENTIN 300 MG PO CAPS: 300.0000 mg | ORAL_CAPSULE | Freq: Three times a day (TID) | ORAL | 2 refills | Status: DC

## 2017-05-09 MED ORDER — ALPRAZOLAM 2 MG PO TABS: 2.0000 mg | ORAL_TABLET | Freq: Three times a day (TID) | ORAL | 2 refills | Status: DC

## 2017-05-09 NOTE — Progress Notes (Signed)
[facility] MD/PA/NP OP Progress Note  05/09/2017 1:50 PM Roger Duran  MRN:  [id number]  Chief Complaint:  Chief Complaint    Depression; Manic Behavior; Anxiety; Follow-up     HPI: this patient is a 47 year old single white male who lives with his grandmother in [place]. He identifies himself as gay but is currently not in a relationship. 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  Patient returns after 2 months for follow-up of treatment of bipolar disorder.  He states for the most part he has been doing pretty well.  However he is not been sleeping well.  He does not go to bed till 1 AM but will sleep for a few hours and wake up several times through the night.  He is getting more and more exhausted and irritable.  He has been helping a friend out with a farm and staying active and getting fresh air but is really not helping.  He only drinks 1 cup of coffee per day in terms of caffeine.  He is not changed any other medications or any other of his habits.  He has tried trazodone which made her excessively drowsy in the next day as did Vistaril.  He is never tried Ambien or Restoril so we can add 1 of these.  Overall his mood is been stable and his anxiety is well controlled with his Xanax Visit Diagnosis:    ICD-10-CM   1. Bipolar I disorder, most recent episode depressed ([place]) F31.30     Past Psychiatric History: One psychiatric hospitalization in 2009 after a suicide attempt  Past Medical History:  Past Medical History:  Diagnosis Date  . Anxiety   . Bipolar 1 disorder ([place])   . Depression   . Mania ([place])   . Psoriasis     Past Surgical History:  Procedure Laterality Date  . APPENDECTOMY    . COLONOSCOPY N/A 11/14/2014   Procedure: COLONOSCOPY;  Surgeon: Rogene Houston, MD;  [facility];  Service: Endoscopy;  Laterality: N/A;  11:10 - moved to 8:30 - Ann to notify  . TONSILLECTOMY    . WRIST SURGERY      Family Psychiatric History:  See below  Family History:  Family History  Problem Relation Age of Onset  . Hyperlipidemia Mother   . Heart disease Father   . Anxiety disorder Father   . Bipolar disorder Maternal Grandfather     Social History:  Social History   Socioeconomic History  . Marital status: Single    Spouse name: None  . Number of children: None  . Years of education: None  . Highest education level: None  Social Needs  . Financial resource strain: None  . Food insecurity - worry: None  . Food insecurity - inability: None  . Transportation needs - medical: None  . Transportation needs - non-medical: None  Occupational History  . None  Tobacco Use  . Smoking status: Current Every Day Smoker    Packs/day: 0.50    Types: Cigarettes, E-cigarettes  . Smokeless tobacco: Former Systems developer  . Tobacco comment: Per pt he uses E-cigarettes  Substance and Sexual Activity  . Alcohol use: No    Alcohol/week: 0.0 oz    Comment: rarely  . Drug use: No    Comment: 1995-2000 PER PT HE DID A LOT OF COCAINE AND PILLS AND DRANK A LOT  . Sexual activity: Yes    Partners: Male    Birth  control/protection: Condom  Other Topics Concern  . None  Social History Narrative  . None    Allergies:  Allergies  Allergen Reactions  . Penicillins Itching and Rash    Metabolic Disorder Labs: No results found for: HGBA1C, MPG No results found for: PROLACTIN No results found for: CHOL, TRIG, HDL, CHOLHDL, VLDL, LDLCALC No results found for: TSH  Therapeutic Level Labs: Lab Results  Component Value Date   LITHIUM 1.10 10/15/2014   No results found for: VALPROATE No components found for:  CBMZ  Current Medications: Current Outpatient Medications  Medication Sig Dispense Refill  . acyclovir (ZOVIRAX) 400 MG tablet Take 400 mg by mouth 3 (three) times daily.     . Adalimumab (HUMIRA PEN) 40 MG/0.8ML PNKT     . alprazolam (XANAX) 2 MG tablet Take 1 tablet (2 mg total) by mouth 3 (three) times daily. 90 tablet 2   . buPROPion (WELLBUTRIN) 75 MG tablet Take 1 tablet (75 mg total) by mouth every morning. 30 tablet 2  . Cholecalciferol (VITAMIN D3 PO) Take by mouth daily.    . cycloSPORINE (RESTASIS) 0.05 % ophthalmic emulsion Place 1 drop into both eyes 2 (two) times daily.    Marland Kitchen gabapentin (NEURONTIN) 300 MG capsule Take 1 capsule (300 mg total) by mouth 3 (three) times daily. 90 capsule 2  . lamoTRIgine (LAMICTAL) 200 MG tablet Take 1 tablet (200 mg total) by mouth 2 (two) times daily. 60 tablet 2  . loratadine (CLARITIN) 10 MG tablet Take 10 mg by mouth daily.    Marland Kitchen lurasidone (LATUDA) 80 MG TABS tablet Take 1 tablet (80 mg total) by mouth daily with supper. 30 tablet 2  . Melatonin 10 MG TABS Take 20 mg by mouth as needed.     . Omega-3 Fatty Acids (OMEGA 3 PO) Take by mouth 2 (two) times daily.    Marland Kitchen omeprazole (PRILOSEC) 20 MG capsule Take 20 mg by mouth daily.    . Sennosides (SENOKOT PO) Take by mouth as needed.    Marland Kitchen tenofovir (VIREAD) 300 MG tablet Take 300 mg by mouth daily.    Marland Kitchen triamcinolone cream (KENALOG) 0.1 % as needed.    . vitamin C (ASCORBIC ACID) 500 MG tablet Take 500 mg by mouth daily.    . Wheat Dextrin (BENEFIBER DRINK MIX) PACK Take 4 g by mouth at bedtime. (Patient taking differently: Take 4 g by mouth as needed. )    . zolpidem (AMBIEN) 10 MG tablet Take 1 tablet (10 mg total) by mouth at bedtime as needed for sleep. 30 tablet 2   No current facility-administered medications for this visit.      Musculoskeletal: Strength & Muscle Tone: within normal limits Gait & Station: normal Patient leans: N/A  Psychiatric Specialty Exam: Review of Systems  Psychiatric/Behavioral: The patient has insomnia.   All other systems reviewed and are negative.   Blood pressure 129/86, pulse (!) 127, height 5\' 6"  (1.676 m), weight 149 lb (67.6 kg), SpO2 95 %.Body mass index is 24.05 kg/m.  General Appearance: Casual, Neat and Well Groomed  Eye Contact:  Good  Speech:  Clear and Coherent   Volume:  Normal  Mood:  Irritable  Affect:  Congruent  Thought Process:  Goal Directed  Orientation:  Full (Time, Place, and Person)  Thought Content: Rumination   Suicidal Thoughts:  No  Homicidal Thoughts:  No  Memory:  Immediate;   Good Recent;   Good Remote;   Good  Judgement:  Fair  Insight:  Fair  Psychomotor Activity:  Normal  Concentration:  Concentration: Good and Attention Span: Good  Recall:  Good  Fund of Knowledge: Good  Language: Good  Akathisia:  No  Handed:  Right  AIMS (if indicated): not done  Assets:  Communication Skills Desire for Improvement Resilience Social Support Talents/Skills  ADL's:  Intact  Cognition: WNL  Sleep:  Poor   Screenings: PHQ2-9     Clinical Support from 01/07/2014 in [place]  PHQ-2 Total Score  0       Assessment and Plan: This patient is a 47 year old male with a history of bipolar disorder.  His mood has been fairly stable but now he is not sleeping well and having more irritability.  We will add Ambien 10 mg at bedtime for his sleep.  He will continue Xanax 2 mg 3 times a day for anxiety, Wellbutrin 75 mg every morning for depression, Lamictal 200 mg twice a day and gabapentin 300 mg 3 times a day for mood stabilization and lurasidone 80 mg daily for bipolar depression.  He will return to see me in 2 months but call sooner if the Ambien does not help his sleep   [name], MD 05/09/2017, 1:50 PM

## 2017-05-22 ENCOUNTER — Telehealth (HOSPITAL_COMMUNITY): Payer: Self-pay | Admitting: *Deleted

## 2017-05-22 NOTE — Telephone Encounter (Signed)
Spoke with patient & informed per Dr Harrington Challenger :No, the total dosage of 8 mg a day would be too high, he needs to stay on 3 a day. Patient stated he would just adjust the dosage to take 1 @ night

## 2017-05-22 NOTE — Telephone Encounter (Signed)
Dr Harrington Challenger, Patient called stating that on his office visit you had changed him from Xanax to Ambien. And that since then he has been having poor sleep. He states he only sleeps 3-4 hrs on the Ambien. And that he had some left over Xanax from missed  doses & that he slept 6 1/2- 7 hrs. And felt great. He's requesting to go back on the Xanax & instead of 3 x daily could he   now take 4 x daily?

## 2017-05-22 NOTE — Telephone Encounter (Signed)
No, the total dosage of 8 mg a day would be too high, he needs to stay on 3 a day

## 2017-07-10 ENCOUNTER — Telehealth (HOSPITAL_COMMUNITY): Payer: Self-pay | Admitting: Psychiatry

## 2017-07-10 ENCOUNTER — Ambulatory Visit (HOSPITAL_COMMUNITY): Payer: Medicare Other | Admitting: Psychiatry

## 2017-07-10 ENCOUNTER — Encounter (HOSPITAL_COMMUNITY): Payer: Self-pay | Admitting: Psychiatry

## 2017-07-10 VITALS — BP 145/76 | HR 72 | Ht 66.0 in | Wt 149.0 lb

## 2017-07-10 DIAGNOSIS — F419 Anxiety disorder, unspecified: Secondary | ICD-10-CM | POA: Diagnosis not present

## 2017-07-10 DIAGNOSIS — Z818 Family history of other mental and behavioral disorders: Secondary | ICD-10-CM | POA: Diagnosis not present

## 2017-07-10 DIAGNOSIS — R450 Nervousness: Secondary | ICD-10-CM | POA: Diagnosis not present

## 2017-07-10 DIAGNOSIS — F3130 Bipolar disorder, current episode depressed, mild or moderate severity, unspecified: Secondary | ICD-10-CM | POA: Diagnosis not present

## 2017-07-10 DIAGNOSIS — Z736 Limitation of activities due to disability: Secondary | ICD-10-CM | POA: Diagnosis not present

## 2017-07-10 DIAGNOSIS — F17210 Nicotine dependence, cigarettes, uncomplicated: Secondary | ICD-10-CM | POA: Diagnosis not present

## 2017-07-10 MED ORDER — GABAPENTIN 300 MG PO CAPS: 300.0000 mg | ORAL_CAPSULE | Freq: Three times a day (TID) | ORAL | 2 refills | Status: DC

## 2017-07-10 MED ORDER — BUPROPION HCL 75 MG PO TABS: 75.0000 mg | ORAL_TABLET | ORAL | 2 refills | Status: DC

## 2017-07-10 MED ORDER — LURASIDONE HCL 80 MG PO TABS: 80.0000 mg | ORAL_TABLET | Freq: Every day | ORAL | 2 refills | Status: DC

## 2017-07-10 MED ORDER — ALPRAZOLAM 2 MG PO TABS: 2.0000 mg | ORAL_TABLET | Freq: Three times a day (TID) | ORAL | 2 refills | Status: DC

## 2017-07-10 MED ORDER — LAMOTRIGINE 200 MG PO TABS: 200.0000 mg | ORAL_TABLET | Freq: Two times a day (BID) | ORAL | 2 refills | Status: DC

## 2017-07-10 NOTE — Progress Notes (Signed)
[facility] MD/PA/NP OP Progress Note  07/10/2017 1:57 PM Roger Duran  MRN:  [id number]  Chief Complaint:  Chief Complaint    Depression; Manic Behavior; Anxiety; Follow-up     HPI: : this patient is a 47 year old single white male who lives with his grandmother in [place]. He identifies himself as gay but is currently not in a relationship. 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  The patient returns after 2 months for treatment of bipolar disorder.  Overall he is doing pretty well.  He still helps out on a local farm and he enjoys being around the animals.  He had one bad panic attack a few weeks ago at a gathering at his parents home.  He is not used to being around people.  However he is trying to get more exercise and is going to go back to the gym and is working on his breathing every day.  The Ambien helps sleep to some degree as does melatonin.  He states that overall his mood is pretty good and feels fairly stable and he does not have any thoughts of self-harm Visit Diagnosis:    ICD-10-CM   1. Bipolar I disorder, most recent episode depressed ([place]) F31.30     Past Psychiatric History: One psychiatric hospitalization in 2009 after a suicide attempt  Past Medical History:  Past Medical History:  Diagnosis Date  . Anxiety   . Bipolar 1 disorder ([place])   . Depression   . Mania ([place])   . Psoriasis     Past Surgical History:  Procedure Laterality Date  . APPENDECTOMY    . COLONOSCOPY N/A 11/14/2014   Procedure: COLONOSCOPY;  Surgeon: Rogene Houston, MD;  [facility];  Service: Endoscopy;  Laterality: N/A;  11:10 - moved to 8:30 - Ann to notify  . TONSILLECTOMY    . WRIST SURGERY      Family Psychiatric History: See below  Family History:  Family History  Problem Relation Age of Onset  . Hyperlipidemia Mother   . Heart disease Father   . Anxiety disorder Father   . Bipolar disorder Maternal Grandfather     Social  History:  Social History   Socioeconomic History  . Marital status: Single    Spouse name: None  . Number of children: None  . Years of education: None  . Highest education level: None  Social Needs  . Financial resource strain: None  . Food insecurity - worry: None  . Food insecurity - inability: None  . Transportation needs - medical: None  . Transportation needs - non-medical: None  Occupational History  . None  Tobacco Use  . Smoking status: Current Every Day Smoker    Packs/day: 0.50    Types: Cigarettes, E-cigarettes  . Smokeless tobacco: Former Systems developer  . Tobacco comment: Per pt he uses E-cigarettes  Substance and Sexual Activity  . Alcohol use: No    Alcohol/week: 0.0 oz    Comment: rarely  . Drug use: No    Comment: 1995-2000 PER PT HE DID A LOT OF COCAINE AND PILLS AND DRANK A LOT  . Sexual activity: Yes    Partners: Male    Birth control/protection: Condom  Other Topics Concern  . None  Social History Narrative  . None    Allergies:  Allergies  Allergen Reactions  . Penicillins Itching and Rash    Metabolic Disorder Labs: No results found for: HGBA1C, MPG No results  found for: PROLACTIN No results found for: CHOL, TRIG, HDL, CHOLHDL, VLDL, LDLCALC No results found for: TSH  Therapeutic Level Labs: Lab Results  Component Value Date   LITHIUM 1.10 10/15/2014   No results found for: VALPROATE No components found for:  CBMZ  Current Medications: Current Outpatient Medications  Medication Sig Dispense Refill  . acyclovir (ZOVIRAX) 400 MG tablet Take 400 mg by mouth 3 (three) times daily.     . Adalimumab (HUMIRA PEN) 40 MG/0.8ML PNKT     . alprazolam (XANAX) 2 MG tablet Take 1 tablet (2 mg total) by mouth 3 (three) times daily. 90 tablet 2  . buPROPion (WELLBUTRIN) 75 MG tablet Take 1 tablet (75 mg total) by mouth every morning. 30 tablet 2  . Cholecalciferol (VITAMIN D3 PO) Take by mouth daily.    . cycloSPORINE (RESTASIS) 0.05 % ophthalmic  emulsion Place 1 drop into both eyes 2 (two) times daily.    Marland Kitchen gabapentin (NEURONTIN) 300 MG capsule Take 1 capsule (300 mg total) by mouth 3 (three) times daily. 90 capsule 2  . lamoTRIgine (LAMICTAL) 200 MG tablet Take 1 tablet (200 mg total) by mouth 2 (two) times daily. 60 tablet 2  . loratadine (CLARITIN) 10 MG tablet Take 10 mg by mouth daily.    Marland Kitchen lurasidone (LATUDA) 80 MG TABS tablet Take 1 tablet (80 mg total) by mouth daily with supper. 30 tablet 2  . Melatonin 10 MG TABS Take 20 mg by mouth as needed.     . Omega-3 Fatty Acids (OMEGA 3 PO) Take by mouth 2 (two) times daily.    Marland Kitchen omeprazole (PRILOSEC) 20 MG capsule Take 20 mg by mouth daily.    . Sennosides (SENOKOT PO) Take by mouth as needed.    Marland Kitchen tenofovir (VIREAD) 300 MG tablet Take 300 mg by mouth daily.    Marland Kitchen triamcinolone cream (KENALOG) 0.1 % as needed.    . vitamin C (ASCORBIC ACID) 500 MG tablet Take 500 mg by mouth daily.    . Wheat Dextrin (BENEFIBER DRINK MIX) PACK Take 4 g by mouth at bedtime. (Patient taking differently: Take 4 g by mouth as needed. )    . zolpidem (AMBIEN) 10 MG tablet Take 1 tablet (10 mg total) by mouth at bedtime as needed for sleep. 30 tablet 2   No current facility-administered medications for this visit.      Musculoskeletal: Strength & Muscle Tone: within normal limits Gait & Station: normal Patient leans: N/A  Psychiatric Specialty Exam: Review of Systems  Psychiatric/Behavioral: The patient is nervous/anxious.   All other systems reviewed and are negative.   Blood pressure (!) 145/76, pulse 72, height 5\' 6"  (1.676 m), weight 149 lb (67.6 kg), SpO2 98 %.Body mass index is 24.05 kg/m.  General Appearance: Casual and Fairly Groomed  Eye Contact:  Good  Speech:  Clear and Coherent  Volume:  Normal  Mood:  Anxious  Affect:  Congruent  Thought Process:  Goal Directed  Orientation:  Full (Time, Place, and Person)  Thought Content: Rumination   Suicidal Thoughts:  No  Homicidal  Thoughts:  No  Memory:  Immediate;   Good Recent;   Good Remote;   Good  Judgement:  Good  Insight:  Fair  Psychomotor Activity:  Normal  Concentration:  Concentration: Good and Attention Span: Good  Recall:  Good  Fund of Knowledge: Good  Language: Good  Akathisia:  No  Handed:  Right  AIMS (if indicated): not done  Assets:  Communication Skills Desire for Improvement Resilience Social Support Talents/Skills  ADL's:  Intact  Cognition: WNL  Sleep:  Fair   Screenings: PHQ2-9     Clinical Support from 01/07/2014 in [place]  PHQ-2 Total Score  0       Assessment and Plan: This patient is a 47 year old male with a history of bipolar disorder.  He seems to be doing well on his current medications.  He will continue Lamictal 200 mg twice a day for mood stabilization.  Xanax 2 mg 3 times daily for anxiety, Wellbutrin 75 mg every morning for depression and Ambien 10 mg at bedtime as needed for sleep as well as gabapentin 300 mg 3 times daily as needed for anxiety.  He will return to see me in 2 months   [name], MD 07/10/2017, 1:57 PM

## 2017-07-10 NOTE — Telephone Encounter (Signed)
Would you please call him back and tell him it could possibly be the wellbutrin. I don't see any others that may be causing tremor

## 2017-09-12 ENCOUNTER — Ambulatory Visit (HOSPITAL_COMMUNITY): Payer: Medicare Other | Admitting: Psychiatry

## 2017-09-12 ENCOUNTER — Encounter (HOSPITAL_COMMUNITY): Payer: Self-pay | Admitting: Psychiatry

## 2017-09-12 VITALS — BP 134/81 | HR 62 | Ht 66.0 in | Wt 149.0 lb

## 2017-09-12 DIAGNOSIS — Z736 Limitation of activities due to disability: Secondary | ICD-10-CM

## 2017-09-12 DIAGNOSIS — F17210 Nicotine dependence, cigarettes, uncomplicated: Secondary | ICD-10-CM

## 2017-09-12 DIAGNOSIS — Z818 Family history of other mental and behavioral disorders: Secondary | ICD-10-CM

## 2017-09-12 DIAGNOSIS — F3130 Bipolar disorder, current episode depressed, mild or moderate severity, unspecified: Secondary | ICD-10-CM | POA: Diagnosis not present

## 2017-09-12 MED ORDER — LURASIDONE HCL 80 MG PO TABS: 80.0000 mg | ORAL_TABLET | Freq: Every day | ORAL | 2 refills | Status: DC

## 2017-09-12 MED ORDER — ZOLPIDEM TARTRATE 10 MG PO TABS: 10.0000 mg | ORAL_TABLET | Freq: Every evening | ORAL | 2 refills | Status: DC | PRN

## 2017-09-12 MED ORDER — BUPROPION HCL 100 MG PO TABS: 50.0000 mg | ORAL_TABLET | Freq: Every day | ORAL | 2 refills | Status: DC

## 2017-09-12 MED ORDER — LAMOTRIGINE 200 MG PO TABS: 200.0000 mg | ORAL_TABLET | Freq: Two times a day (BID) | ORAL | 2 refills | Status: DC

## 2017-09-12 MED ORDER — ALPRAZOLAM 2 MG PO TABS: 2.0000 mg | ORAL_TABLET | Freq: Three times a day (TID) | ORAL | 2 refills | Status: DC

## 2017-09-12 MED ORDER — GABAPENTIN 300 MG PO CAPS: 300.0000 mg | ORAL_CAPSULE | Freq: Three times a day (TID) | ORAL | 2 refills | Status: DC

## 2017-09-12 NOTE — Progress Notes (Signed)
[facility] MD/PA/NP OP Progress Note  09/12/2017 1:58 PM Roger Duran  MRN:  [id number]  Chief Complaint:  Chief Complaint    Depression; Anxiety; Manic Behavior; Follow-up     HPI: Patient is a 47 year old single white male who lives with his grandmother and [place].  He identifies himself as gay but is currently not in a relationship.  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  He called after last visit and complained of a tremor.  We discussed that more length today.  His right hand shakes particularly when he is reaching for something or trying to hold something like a cup and also when he makes a fist.  It was very noticeable today.  I am not entirely sure which medication might be causing this but the most recent medicine added his Wellbutrin and this may be the culprit but he is on a combination of various psychiatric medicines.  I told him we could try to cut down the Wellbutrin a little bit first to see if this would help.  Emotionally the patient seems to be doing well.  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  For the most part he is sleeping well.  At times he gets a little "manicky" and talks too much but he has not had any overt manic episodes or severe depressions or suicidal ideation Visit Diagnosis:    ICD-10-CM   1. Bipolar I disorder, most recent episode depressed ([place]) F31.30     Past Psychiatric History: One psychiatric hospitalization in 2009 after suicide attempt  Past Medical History:  Past Medical History:  Diagnosis Date  . Anxiety   . Bipolar 1 disorder ([place])   . Depression   . Mania ([place])   . Psoriasis     Past Surgical History:  Procedure Laterality Date  . APPENDECTOMY    . COLONOSCOPY N/A 11/14/2014   Procedure: COLONOSCOPY;  Surgeon: Rogene Houston, MD;  [facility];  Service: Endoscopy;  Laterality: N/A;  11:10 - moved to 8:30 - Ann to notify  . TONSILLECTOMY    . WRIST SURGERY      Family Psychiatric History: See below  Family History:  Family History  Problem Relation Age of Onset  . Hyperlipidemia Mother   . Heart disease Father   . Anxiety disorder Father   . Bipolar disorder Maternal Grandfather     Social History:  Social History   Socioeconomic History  . Marital status: Single    Spouse name: Not on file  . Number of children: Not on file  . Years of education: Not on file  . Highest education level: Not on file  Occupational History  . Not on file  Social Needs  . Financial resource strain: Not on file  . Food insecurity:    Worry: Not on file    Inability: Not on file  . Transportation needs:    Medical: Not on file    Non-medical: Not on file  Tobacco Use  . Smoking status: Current Every Day Smoker    Packs/day: 0.50    Types: Cigarettes, E-cigarettes  . Smokeless tobacco: Former Systems developer  . Tobacco comment: Per pt he uses E-cigarettes  Substance and Sexual Activity  . Alcohol use: No    Alcohol/week: 0.0  oz    Comment: rarely  . Drug use: No    Comment: 1995-2000 PER PT HE DID A LOT OF COCAINE AND PILLS AND DRANK A LOT  . Sexual activity: Yes    Partners: Male    Birth control/protection: Condom  Lifestyle  . Physical activity:    Days per week: Not on file    Minutes per session: Not on file  . Stress: Not on file  Relationships  . Social connections:    Talks on phone: Not on file    Gets together: Not on file    Attends religious service: Not on file    Active member of club or organization: Not on file    Attends meetings of clubs or organizations: Not on file    Relationship status: Not on file  Other Topics Concern  . Not on file  Social History Narrative  . Not on file    Allergies:  Allergies  Allergen Reactions  . Penicillins Itching and Rash    Metabolic Disorder Labs: No  results found for: HGBA1C, MPG No results found for: PROLACTIN No results found for: CHOL, TRIG, HDL, CHOLHDL, VLDL, LDLCALC No results found for: TSH  Therapeutic Level Labs: Lab Results  Component Value Date   LITHIUM 1.10 10/15/2014   No results found for: VALPROATE No components found for:  CBMZ  Current Medications: Current Outpatient Medications  Medication Sig Dispense Refill  . acyclovir (ZOVIRAX) 400 MG tablet Take 400 mg by mouth 3 (three) times daily.     . Adalimumab (HUMIRA PEN) 40 MG/0.8ML PNKT     . alprazolam (XANAX) 2 MG tablet Take 1 tablet (2 mg total) by mouth 3 (three) times daily. 90 tablet 2  . Cholecalciferol (VITAMIN D3 PO) Take by mouth daily.    . cycloSPORINE (RESTASIS) 0.05 % ophthalmic emulsion Place 1 drop into both eyes 2 (two) times daily.    Marland Kitchen gabapentin (NEURONTIN) 300 MG capsule Take 1 capsule (300 mg total) by mouth 3 (three) times daily. 90 capsule 2  . lamoTRIgine (LAMICTAL) 200 MG tablet Take 1 tablet (200 mg total) by mouth 2 (two) times daily. 60 tablet 2  . loratadine (CLARITIN) 10 MG tablet Take 10 mg by mouth daily.    Marland Kitchen lurasidone (LATUDA) 80 MG TABS tablet Take 1 tablet (80 mg total) by mouth daily with supper. 30 tablet 2  . Melatonin 10 MG TABS Take 20 mg by mouth as needed.     . Omega-3 Fatty Acids (OMEGA 3 PO) Take by mouth 2 (two) times daily.    Marland Kitchen omeprazole (PRILOSEC) 20 MG capsule Take 20 mg by mouth daily.    . Sennosides (SENOKOT PO) Take by mouth as needed.    Marland Kitchen tenofovir (VIREAD) 300 MG tablet Take 300 mg by mouth daily.    Marland Kitchen triamcinolone cream (KENALOG) 0.1 % as needed.    . vitamin C (ASCORBIC ACID) 500 MG tablet Take 500 mg by mouth daily.    . Wheat Dextrin (BENEFIBER DRINK MIX) PACK Take 4 g by mouth at bedtime. (Patient taking differently: Take 4 g by mouth as needed. )    . buPROPion (WELLBUTRIN) 100 MG tablet Take 0.5 tablets (50 mg total) by mouth daily. 30 tablet 2  . zolpidem (AMBIEN) 10 MG tablet Take 1 tablet  (10 mg total) by mouth at bedtime as needed for sleep. 30 tablet 2   No current facility-administered medications for this visit.      Musculoskeletal:  Strength & Muscle Tone: within normal limits Gait & Station: normal Patient leans: N/A  Psychiatric Specialty Exam: Review of Systems  Neurological: Positive for tremors.  All other systems reviewed and are negative.   Blood pressure 134/81, pulse 62, height 5\' 6"  (1.676 m), weight 149 lb (67.6 kg), SpO2 98 %.Body mass index is 24.05 kg/m.  General Appearance: Casual and Fairly Groomed  Eye Contact:  Good  Speech:  Clear and Coherent  Volume:  Normal  Mood:  Euthymic  Affect:  Congruent  Thought Process:  Goal Directed  Orientation:  Full (Time, Place, and Person)  Thought Content: Rumination   Suicidal Thoughts:  No  Homicidal Thoughts:  No  Memory:  Immediate;   Good Recent;   Good Remote;   Good  Judgement:  Good  Insight:  Fair  Psychomotor Activity:  Tremor  Concentration:  Concentration: Fair and Attention Span: Fair  Recall:  Good  Fund of Knowledge: Good  Language: Good  Akathisia:  No  Handed:  Right  AIMS (if indicated): not done  Assets:  Communication Skills Desire for Improvement Resilience Social Support Talents/Skills  ADL's:  Intact  Cognition: WNL  Sleep:  Good   Screenings: PHQ2-9     Clinical Support from 01/07/2014 in [place]  PHQ-2 Total Score  0       Assessment and Plan: This patient is a 47 year old male with a history of bipolar disorder.  He does have a tremor in both hands which is worse in the right hand particularly with intention.  I am not entirely sure what this is from but we can try cutting back the Wellbutrin from 75 to 50 mg daily.  He will continue gabapentin 300 mg 3 times daily for mood stabilization, Lamictal 200 mg twice a day for mood stabilization Latuda 80 mg daily for bipolar depression Xanax 2 mg 3 times a day for anxiety and Ambien 10 mg  at bedtime as needed for sleep.  He will return to see me in 2 months.   [name], MD 09/12/2017, 1:58 PM

## 2017-10-12 ENCOUNTER — Other Ambulatory Visit (HOSPITAL_COMMUNITY): Payer: Self-pay | Admitting: Psychiatry

## 2017-10-12 ENCOUNTER — Telehealth (HOSPITAL_COMMUNITY): Payer: Self-pay | Admitting: *Deleted

## 2017-10-12 MED ORDER — BUPROPION HCL 75 MG PO TABS: 75.0000 mg | ORAL_TABLET | Freq: Every day | ORAL | 2 refills | Status: DC

## 2017-10-12 NOTE — Telephone Encounter (Signed)
LVM per Provider : Wellbutrin 75 mg once daily sent in

## 2017-10-12 NOTE — Telephone Encounter (Signed)
Dr Harrington Challenger Patient called stating that the change in the Wellbutrin taking 0.5 in the AM  & 0.5 in the PM The tremors there os no difference & generally he has increased depression. Stated that the 75 mg once/day seemed to have worked better. Concerned about what can be done now?

## 2017-10-12 NOTE — Telephone Encounter (Signed)
Wellbutrin 75 mg once daily sent in

## 2017-11-13 ENCOUNTER — Encounter (HOSPITAL_COMMUNITY): Payer: Self-pay | Admitting: Psychiatry

## 2017-11-13 ENCOUNTER — Ambulatory Visit (HOSPITAL_COMMUNITY): Payer: Medicare Other | Admitting: Psychiatry

## 2017-11-13 VITALS — BP 145/80 | HR 76 | Ht 66.0 in | Wt 149.4 lb

## 2017-11-13 DIAGNOSIS — F17210 Nicotine dependence, cigarettes, uncomplicated: Secondary | ICD-10-CM | POA: Diagnosis not present

## 2017-11-13 DIAGNOSIS — R251 Tremor, unspecified: Secondary | ICD-10-CM

## 2017-11-13 DIAGNOSIS — F3130 Bipolar disorder, current episode depressed, mild or moderate severity, unspecified: Secondary | ICD-10-CM

## 2017-11-13 MED ORDER — ZOLPIDEM TARTRATE 10 MG PO TABS: 10.0000 mg | ORAL_TABLET | Freq: Every evening | ORAL | 2 refills | Status: DC | PRN

## 2017-11-13 MED ORDER — ALPRAZOLAM 2 MG PO TABS: 2.0000 mg | ORAL_TABLET | Freq: Three times a day (TID) | ORAL | 2 refills | Status: DC

## 2017-11-13 MED ORDER — BUPROPION HCL 75 MG PO TABS: 75.0000 mg | ORAL_TABLET | Freq: Every day | ORAL | 2 refills | Status: DC

## 2017-11-13 MED ORDER — LURASIDONE HCL 80 MG PO TABS: 80.0000 mg | ORAL_TABLET | Freq: Every day | ORAL | 2 refills | Status: DC

## 2017-11-13 MED ORDER — LAMOTRIGINE 200 MG PO TABS: 200.0000 mg | ORAL_TABLET | Freq: Two times a day (BID) | ORAL | 2 refills | Status: DC

## 2017-11-13 MED ORDER — GABAPENTIN 300 MG PO CAPS: 300.0000 mg | ORAL_CAPSULE | Freq: Three times a day (TID) | ORAL | 2 refills | Status: DC

## 2017-11-13 NOTE — Progress Notes (Signed)
[facility] MD/PA/NP OP Progress Note  11/13/2017 2:00 PM Roger Duran  MRN:  [id number]  Chief Complaint:  Chief Complaint    Depression; Manic Behavior; Anxiety; Follow-up     HPI: Patient is a 47 year old single white male who lives with his grandmother and [place].  He identifies himself as gay but is currently not in a relationship.  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   the patient returns after 2 months.  He seems to be doing fairly well.  We tried cutting down the Wellbutrin because I was concerned that it was causing tremor.  However after few weeks he called and stated he was more depressed and we have gone back to the 75 mg.  His mood seems to be pretty good now.  He shows very little evidence of tremor today.  He denies severe symptoms of depression anxiety or suicidality.  He is sleeping well.  He is still helping out his friends with the farm feeding the animals and he enjoys it very much. Visit Diagnosis:    ICD-10-CM   1. Bipolar I disorder, most recent episode depressed ([place]) F31.30     Past Psychiatric History: Psychiatric hospitalization in 2009 after suicide attempt  Past Medical History:  Past Medical History:  Diagnosis Date  . Anxiety   . Bipolar 1 disorder ([place])   . Depression   . Mania ([place])   . Psoriasis     Past Surgical History:  Procedure Laterality Date  . APPENDECTOMY    . COLONOSCOPY N/A 11/14/2014   Procedure: COLONOSCOPY;  Surgeon: Rogene Houston, MD;  [facility];  Service: Endoscopy;  Laterality: N/A;  11:10 - moved to 8:30 - Ann to notify  . TONSILLECTOMY    . WRIST SURGERY      Family Psychiatric History: See below  Family History:  Family History  Problem Relation Age of Onset  . Hyperlipidemia Mother   . Heart disease Father   . Anxiety disorder Father   . Bipolar disorder Maternal Grandfather     Social History:  Social History   Socioeconomic History  . Marital status:  Single    Spouse name: Not on file  . Number of children: Not on file  . Years of education: Not on file  . Highest education level: Not on file  Occupational History  . Not on file  Social Needs  . Financial resource strain: Not on file  . Food insecurity:    Worry: Not on file    Inability: Not on file  . Transportation needs:    Medical: Not on file    Non-medical: Not on file  Tobacco Use  . Smoking status: Current Every Day Smoker    Packs/day: 0.50    Types: Cigarettes, E-cigarettes  . Smokeless tobacco: Former Systems developer  . Tobacco comment: Per pt he uses E-cigarettes  Substance and Sexual Activity  . Alcohol use: No    Alcohol/week: 0.0 oz    Comment: rarely  . Drug use: No    Comment: 1995-2000 PER PT HE DID A LOT OF COCAINE AND PILLS AND DRANK A LOT  . Sexual activity: Yes    Partners: Male    Birth control/protection: Condom  Lifestyle  . Physical activity:    Days per week: Not on file    Minutes per session: Not on file  . Stress: Not on file  Relationships  . Social connections:    Talks  on phone: Not on file    Gets together: Not on file    Attends religious service: Not on file    Active member of club or organization: Not on file    Attends meetings of clubs or organizations: Not on file    Relationship status: Not on file  Other Topics Concern  . Not on file  Social History Narrative  . Not on file    Allergies:  Allergies  Allergen Reactions  . Penicillins Itching and Rash    Metabolic Disorder Labs: No results found for: HGBA1C, MPG No results found for: PROLACTIN No results found for: CHOL, TRIG, HDL, CHOLHDL, VLDL, LDLCALC No results found for: TSH  Therapeutic Level Labs: Lab Results  Component Value Date   LITHIUM 1.10 10/15/2014   No results found for: VALPROATE No components found for:  CBMZ  Current Medications: Current Outpatient Medications  Medication Sig Dispense Refill  . acyclovir (ZOVIRAX) 400 MG tablet Take 400 mg by  mouth 3 (three) times daily.     . Adalimumab (HUMIRA PEN) 40 MG/0.8ML PNKT     . alprazolam (XANAX) 2 MG tablet Take 1 tablet (2 mg total) by mouth 3 (three) times daily. 90 tablet 2  . buPROPion (WELLBUTRIN) 75 MG tablet Take 1 tablet (75 mg total) by mouth daily. 30 tablet 2  . Cholecalciferol (VITAMIN D3 PO) Take by mouth daily.    . cycloSPORINE (RESTASIS) 0.05 % ophthalmic emulsion Place 1 drop into both eyes 2 (two) times daily.    Marland Kitchen gabapentin (NEURONTIN) 300 MG capsule Take 1 capsule (300 mg total) by mouth 3 (three) times daily. 90 capsule 2  . lamoTRIgine (LAMICTAL) 200 MG tablet Take 1 tablet (200 mg total) by mouth 2 (two) times daily. 60 tablet 2  . loratadine (CLARITIN) 10 MG tablet Take 10 mg by mouth daily.    Marland Kitchen lurasidone (LATUDA) 80 MG TABS tablet Take 1 tablet (80 mg total) by mouth daily with supper. 30 tablet 2  . Melatonin 10 MG TABS Take 20 mg by mouth as needed.     . Omega-3 Fatty Acids (OMEGA 3 PO) Take by mouth 2 (two) times daily.    Marland Kitchen omeprazole (PRILOSEC) 20 MG capsule Take 20 mg by mouth daily.    . Sennosides (SENOKOT PO) Take by mouth as needed.    Marland Kitchen tenofovir (VIREAD) 300 MG tablet Take 300 mg by mouth daily.    . vitamin C (ASCORBIC ACID) 500 MG tablet Take 500 mg by mouth daily.    . Wheat Dextrin (BENEFIBER DRINK MIX) PACK Take 4 g by mouth at bedtime. (Patient taking differently: Take 4 g by mouth as needed. )    . triamcinolone cream (KENALOG) 0.1 % as needed.    . zolpidem (AMBIEN) 10 MG tablet Take 1 tablet (10 mg total) by mouth at bedtime as needed for sleep. 30 tablet 2   No current facility-administered medications for this visit.      Musculoskeletal: Strength & Muscle Tone: within normal limits Gait & Station: normal Patient leans: N/A  Psychiatric Specialty Exam: Review of Systems  Neurological: Positive for tremors.  All other systems reviewed and are negative.   Blood pressure (!) 145/80, pulse 76, height 5\' 6"  (1.676 m), weight 149  lb 6.4 oz (67.8 kg), SpO2 96 %.Body mass index is 24.11 kg/m.  General Appearance: Casual, Neat and Well Groomed  Eye Contact:  Good  Speech:  Clear and Coherent  Volume:  Normal  Mood:  Euthymic  Affect:  Congruent  Thought Process:  Goal Directed  Orientation:  Full (Time, Place, and Person)  Thought Content: WDL   Suicidal Thoughts:  No  Homicidal Thoughts:  No  Memory:  Immediate;   Good Recent;   Good Remote;   Good  Judgement:  Fair  Insight:  Good  Psychomotor Activity:  Tremor  Concentration:  Concentration: Good and Attention Span: Good  Recall:  Good  Fund of Knowledge: Good  Language: Good  Akathisia:  No  Handed:  Right  AIMS (if indicated): not done  Assets:  Communication Skills Desire for Improvement Resilience Social Support Talents/Skills  ADL's:  Intact  Cognition: WNL  Sleep:  Good   Screenings: PHQ2-9     Clinical Support from 01/07/2014 in [facility]  PHQ-2 Total Score  0       Assessment and Plan: Patient is a 47 year old male with a history of bipolar disorder.  He seems to be stable on his current regimen.  He will continue Xanax 2 mg 3 times daily for anxiety, Wellbutrin 75 mg daily for depression, gabapentin 300 mg 3 times daily for anxiety, Latuda 80 mg daily for mood stabilization and Ambien 10 mg at bedtime as needed for sleep.  His tremor seems almost nonexistent today but we will continue to keep an eye on it.  He will return to see me in 2 months   [name], MD 11/13/2017, 2:00 PM

## 2018-01-15 ENCOUNTER — Encounter (HOSPITAL_COMMUNITY): Payer: Self-pay | Admitting: Psychiatry

## 2018-01-15 ENCOUNTER — Ambulatory Visit (HOSPITAL_COMMUNITY): Payer: Medicare Other | Admitting: Psychiatry

## 2018-01-15 VITALS — BP 126/82 | HR 56 | Ht 66.0 in | Wt 146.0 lb

## 2018-01-15 DIAGNOSIS — F3130 Bipolar disorder, current episode depressed, mild or moderate severity, unspecified: Secondary | ICD-10-CM

## 2018-01-15 DIAGNOSIS — Z560 Unemployment, unspecified: Secondary | ICD-10-CM

## 2018-01-15 DIAGNOSIS — Z818 Family history of other mental and behavioral disorders: Secondary | ICD-10-CM | POA: Diagnosis not present

## 2018-01-15 DIAGNOSIS — F17210 Nicotine dependence, cigarettes, uncomplicated: Secondary | ICD-10-CM

## 2018-01-15 DIAGNOSIS — G4700 Insomnia, unspecified: Secondary | ICD-10-CM

## 2018-01-15 MED ORDER — GABAPENTIN 300 MG PO CAPS: 300.0000 mg | ORAL_CAPSULE | Freq: Three times a day (TID) | ORAL | 2 refills | Status: DC

## 2018-01-15 MED ORDER — ALPRAZOLAM 2 MG PO TABS: 2.0000 mg | ORAL_TABLET | Freq: Three times a day (TID) | ORAL | 2 refills | Status: DC

## 2018-01-15 MED ORDER — LAMOTRIGINE 200 MG PO TABS: 200.0000 mg | ORAL_TABLET | Freq: Two times a day (BID) | ORAL | 2 refills | Status: DC

## 2018-01-15 MED ORDER — LURASIDONE HCL 60 MG PO TABS: 60.0000 mg | ORAL_TABLET | Freq: Every day | ORAL | 2 refills | Status: DC

## 2018-01-15 MED ORDER — ZOLPIDEM TARTRATE ER 12.5 MG PO TBCR: 12.5000 mg | EXTENDED_RELEASE_TABLET | Freq: Every evening | ORAL | 2 refills | Status: DC | PRN

## 2018-01-15 MED ORDER — BUPROPION HCL 75 MG PO TABS: 75.0000 mg | ORAL_TABLET | Freq: Every day | ORAL | 2 refills | Status: DC

## 2018-01-15 NOTE — Progress Notes (Signed)
[facility] MD/PA/NP OP Progress Note  01/15/2018 11:50 AM ADRIENNE DELAY  MRN:  [id number]  Chief Complaint:  Chief Complaint    Depression; Anxiety; Manic Behavior; Follow-up     HPI: This patient is a 47 year old single white male who lives with his grandmother and [place].  He identifies as gay but is not in a relationship right now.  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  The patient returns after 2 months.  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  He really enjoys this and it gives him some sort of structure.  He is also taking care of his grandmother who is getting more debilitated.  He is still having tremor in his hands.  For a while but seem to be better but he claims he has it every morning when he gets up.  We tried cutting back the Wellbutrin but then he got very depressed.  I suggested now he start to cut back on the [place] and see if this will help.  He is also not sleeping well through the night and keeps waking up.  I suggested that we switch from Ambien to Ambien CR. Visit Diagnosis:    ICD-10-CM   1. Bipolar I disorder, most recent episode depressed ([place]) F31.30     Past Psychiatric History: Psychiatric hospitalization in 2009 after suicide attempt  Past Medical History:  Past Medical History:  Diagnosis Date  . Anxiety   . Bipolar 1 disorder ([place])   . Depression   . Mania ([place])   . Psoriasis     Past Surgical History:  Procedure Laterality Date  . APPENDECTOMY    . COLONOSCOPY N/A 11/14/2014   Procedure: COLONOSCOPY;  Surgeon: Rogene Houston, MD;  [facility];  Service: Endoscopy;  Laterality: N/A;  11:10 - moved to 8:30 - Ann to notify  . TONSILLECTOMY    . WRIST SURGERY      Family Psychiatric History: See below  Family History:  Family History  Problem Relation Age of Onset  . Hyperlipidemia Mother   . Heart disease Father   . Anxiety disorder Father   . Bipolar  disorder Maternal Grandfather     Social History:  Social History   Socioeconomic History  . Marital status: Single    Spouse name: Not on file  . Number of children: Not on file  . Years of education: Not on file  . Highest education level: Not on file  Occupational History  . Not on file  Social Needs  . Financial resource strain: Not on file  . Food insecurity:    Worry: Not on file    Inability: Not on file  . Transportation needs:    Medical: Not on file    Non-medical: Not on file  Tobacco Use  . Smoking status: Current Every Day Smoker    Packs/day: 0.50    Types: Cigarettes, E-cigarettes  . Smokeless tobacco: Former Systems developer  . Tobacco comment: Per pt he uses E-cigarettes  Substance and Sexual Activity  . Alcohol use: No    Alcohol/week: 0.0 standard drinks    Comment: rarely  . Drug use: No    Comment: 1995-2000 PER PT HE DID A LOT OF COCAINE AND PILLS AND DRANK A LOT  . Sexual activity: Yes    Partners: Male    Birth control/protection: Condom  Lifestyle  . Physical activity:  Days per week: Not on file    Minutes per session: Not on file  . Stress: Not on file  Relationships  . Social connections:    Talks on phone: Not on file    Gets together: Not on file    Attends religious service: Not on file    Active member of club or organization: Not on file    Attends meetings of clubs or organizations: Not on file    Relationship status: Not on file  Other Topics Concern  . Not on file  Social History Narrative  . Not on file    Allergies:  Allergies  Allergen Reactions  . Penicillins Itching and Rash    Metabolic Disorder Labs: No results found for: HGBA1C, MPG No results found for: PROLACTIN No results found for: CHOL, TRIG, HDL, CHOLHDL, VLDL, LDLCALC No results found for: TSH  Therapeutic Level Labs: Lab Results  Component Value Date   LITHIUM 1.10 10/15/2014   No results found for: VALPROATE No components found for:  CBMZ  Current  Medications: Current Outpatient Medications  Medication Sig Dispense Refill  . acyclovir (ZOVIRAX) 400 MG tablet Take 400 mg by mouth 3 (three) times daily.     . Adalimumab (HUMIRA PEN) 40 MG/0.8ML PNKT     . alprazolam (XANAX) 2 MG tablet Take 1 tablet (2 mg total) by mouth 3 (three) times daily. 90 tablet 2  . buPROPion (WELLBUTRIN) 75 MG tablet Take 1 tablet (75 mg total) by mouth daily. 30 tablet 2  . Cholecalciferol (VITAMIN D3 PO) Take by mouth daily.    . cycloSPORINE (RESTASIS) 0.05 % ophthalmic emulsion Place 1 drop into both eyes 2 (two) times daily.    Marland Kitchen gabapentin (NEURONTIN) 300 MG capsule Take 1 capsule (300 mg total) by mouth 3 (three) times daily. 90 capsule 2  . lamoTRIgine (LAMICTAL) 200 MG tablet Take 1 tablet (200 mg total) by mouth 2 (two) times daily. 60 tablet 2  . loratadine (CLARITIN) 10 MG tablet Take 10 mg by mouth daily.    . Melatonin 10 MG TABS Take 20 mg by mouth as needed.     . Omega-3 Fatty Acids (OMEGA 3 PO) Take by mouth 2 (two) times daily.    Marland Kitchen omeprazole (PRILOSEC) 20 MG capsule Take 20 mg by mouth daily.    . Sennosides (SENOKOT PO) Take by mouth as needed.    Marland Kitchen tenofovir (VIREAD) 300 MG tablet Take 300 mg by mouth daily.    Marland Kitchen triamcinolone cream (KENALOG) 0.1 % as needed.    . vitamin C (ASCORBIC ACID) 500 MG tablet Take 500 mg by mouth daily.    . Wheat Dextrin (BENEFIBER DRINK MIX) PACK Take 4 g by mouth at bedtime. (Patient taking differently: Take 4 g by mouth as needed. )    . Lurasidone HCl (LATUDA) 60 MG TABS Take 1 tablet (60 mg total) by mouth daily with supper. 30 tablet 2  . zolpidem (AMBIEN CR) 12.5 MG CR tablet Take 1 tablet (12.5 mg total) by mouth at bedtime as needed for sleep. 30 tablet 2   No current facility-administered medications for this visit.      Musculoskeletal: Strength & Muscle Tone: within normal limits Gait & Station: normal Patient leans: N/A  Psychiatric Specialty Exam: Review of Systems  Neurological:  Positive for tremors.  Psychiatric/Behavioral: The patient has insomnia.   All other systems reviewed and are negative.   Blood pressure 126/82, pulse (!) 56, height 5\' 6"  (1.676  m), weight 146 lb (66.2 kg), SpO2 99 %.Body mass index is 23.57 kg/m.  General Appearance: Casual, Neat and Well Groomed  Eye Contact:  Good  Speech:  Clear and Coherent  Volume:  Normal  Mood:  Anxious  Affect:  Congruent  Thought Process:  Goal Directed  Orientation:  Full (Time, Place, and Person)  Thought Content: Rumination   Suicidal Thoughts:  No  Homicidal Thoughts:  No  Memory:  Immediate;   Good Recent;   Good Remote;   Good  Judgement:  Good  Insight:  Fair  Psychomotor Activity:  Tremor  Concentration:  Concentration: Fair and Attention Span: Fair  Recall:  Good  Fund of Knowledge: Good  Language: Good  Akathisia:  No  Handed:  Right  AIMS (if indicated): Resting tremor in both hands  Assets:  Communication Skills Desire for Improvement Resilience Social Support Talents/Skills  ADL's:  Intact  Cognition: WNL  Sleep:  Poor   Screenings: PHQ2-9     Clinical Support from 01/07/2014 in [facility]  PHQ-2 Total Score  0       Assessment and Plan: This patient is a 47 year old male with a history of bipolar disorder and anxiety.  He is having more difficulty sleeping so we will change him to Ambien CR 12.5 mill grams at bedtime for sleep.  He will continue Wellbutrin 75 mg daily for depression and Xanax 2 mg 3 times daily for anxiety.  Will cut Latuda down to 60 mg for mood stabilization.  We are cutting it down to try to minimize the hand tremor.  He will continue gabapentin 300 mg 3 times daily also for anxiety.  He will return to see me in 4 weeks   [name], MD 01/15/2018, 11:50 AM

## 2018-02-14 ENCOUNTER — Encounter (HOSPITAL_COMMUNITY): Payer: Self-pay | Admitting: Psychiatry

## 2018-02-14 ENCOUNTER — Ambulatory Visit (HOSPITAL_COMMUNITY): Payer: Medicare Other | Admitting: Psychiatry

## 2018-02-14 VITALS — BP 137/78 | HR 64 | Ht 66.0 in | Wt 149.0 lb

## 2018-02-14 DIAGNOSIS — F17210 Nicotine dependence, cigarettes, uncomplicated: Secondary | ICD-10-CM | POA: Diagnosis not present

## 2018-02-14 DIAGNOSIS — F3130 Bipolar disorder, current episode depressed, mild or moderate severity, unspecified: Secondary | ICD-10-CM

## 2018-02-14 MED ORDER — ZOLPIDEM TARTRATE ER 12.5 MG PO TBCR: 12.5000 mg | EXTENDED_RELEASE_TABLET | Freq: Every evening | ORAL | 2 refills | Status: DC | PRN

## 2018-02-14 MED ORDER — BUPROPION HCL 75 MG PO TABS: 75.0000 mg | ORAL_TABLET | Freq: Every day | ORAL | 2 refills | Status: DC

## 2018-02-14 MED ORDER — LAMOTRIGINE 200 MG PO TABS: 200.0000 mg | ORAL_TABLET | Freq: Two times a day (BID) | ORAL | 2 refills | Status: DC

## 2018-02-14 MED ORDER — LURASIDONE HCL 60 MG PO TABS: 60.0000 mg | ORAL_TABLET | Freq: Every day | ORAL | 2 refills | Status: DC

## 2018-02-14 MED ORDER — ALPRAZOLAM 2 MG PO TABS: 2.0000 mg | ORAL_TABLET | Freq: Three times a day (TID) | ORAL | 2 refills | Status: DC

## 2018-02-14 MED ORDER — GABAPENTIN 300 MG PO CAPS: 300.0000 mg | ORAL_CAPSULE | Freq: Three times a day (TID) | ORAL | 2 refills | Status: DC

## 2018-02-14 NOTE — Progress Notes (Signed)
[facility] MD/PA/NP OP Progress Note  02/14/2018 1:49 PM Roger Duran  MRN:  [id number]  Chief Complaint:  Chief Complaint    Depression; Manic Behavior; Anxiety; Follow-up     HPI: This patient is a 47 year old single white male who lives with his grandmother and [place].  He identifies as gay but is not in a relationship right now.  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  The patient returns after 4 weeks for treatment of bipolar disorder.  Last time he was complaining of tremor in his hands.  I cut down his Latuda to 60 mg tremor is gotten much better.  He also was not sleeping through the night and I changed his Ambien to Ambien CR and he is sleeping well through the whole night.  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  He enjoys feeding the animals.  He denies symptoms of depression severe anxiety or suicidal ideation.  He denies agitation or severe mood swings. Visit Diagnosis:    ICD-10-CM   1. Bipolar I disorder, most recent episode depressed ([place]) F31.30     Past Psychiatric History: Psychiatric hospitalization in 2009 after suicide attempt  Past Medical History:  Past Medical History:  Diagnosis Date  . Anxiety   . Bipolar 1 disorder ([place])   . Depression   . Mania ([place])   . Psoriasis     Past Surgical History:  Procedure Laterality Date  . APPENDECTOMY    . COLONOSCOPY N/A 11/14/2014   Procedure: COLONOSCOPY;  Surgeon: Rogene Houston, MD;  [facility];  Service: Endoscopy;  Laterality: N/A;  11:10 - moved to 8:30 - Ann to notify  . TONSILLECTOMY    . WRIST SURGERY      Family Psychiatric History: See below  Family History:  Family History  Problem Relation Age of Onset  . Hyperlipidemia Mother   . Heart disease Father   . Anxiety disorder Father   . Bipolar disorder Maternal Grandfather     Social History:  Social  History   Socioeconomic History  . Marital status: Single    Spouse name: Not on file  . Number of children: Not on file  . Years of education: Not on file  . Highest education level: Not on file  Occupational History  . Not on file  Social Needs  . Financial resource strain: Not on file  . Food insecurity:    Worry: Not on file    Inability: Not on file  . Transportation needs:    Medical: Not on file    Non-medical: Not on file  Tobacco Use  . Smoking status: Current Every Day Smoker    Packs/day: 0.50    Types: Cigarettes, E-cigarettes  . Smokeless tobacco: Former Systems developer  . Tobacco comment: Per pt he uses E-cigarettes  Substance and Sexual Activity  . Alcohol use: No    Alcohol/week: 0.0 standard drinks    Comment: rarely  . Drug use: No    Comment: 1995-2000 PER PT HE DID A LOT OF COCAINE AND PILLS AND DRANK A LOT  . Sexual activity: Yes    Partners: Male    Birth control/protection: Condom  Lifestyle  . Physical activity:    Days per week: Not on file    Minutes per session: Not on file  . Stress: Not  on file  Relationships  . Social connections:    Talks on phone: Not on file    Gets together: Not on file    Attends religious service: Not on file    Active member of club or organization: Not on file    Attends meetings of clubs or organizations: Not on file    Relationship status: Not on file  Other Topics Concern  . Not on file  Social History Narrative  . Not on file    Allergies:  Allergies  Allergen Reactions  . Penicillins Itching and Rash    Metabolic Disorder Labs: No results found for: HGBA1C, MPG No results found for: PROLACTIN No results found for: CHOL, TRIG, HDL, CHOLHDL, VLDL, LDLCALC No results found for: TSH  Therapeutic Level Labs: Lab Results  Component Value Date   LITHIUM 1.10 10/15/2014   No results found for: VALPROATE No components found for:  CBMZ  Current Medications: Current Outpatient Medications  Medication Sig  Dispense Refill  . acyclovir (ZOVIRAX) 400 MG tablet Take 400 mg by mouth 3 (three) times daily.     . Adalimumab (HUMIRA PEN) 40 MG/0.8ML PNKT     . alprazolam (XANAX) 2 MG tablet Take 1 tablet (2 mg total) by mouth 3 (three) times daily. 90 tablet 2  . buPROPion (WELLBUTRIN) 75 MG tablet Take 1 tablet (75 mg total) by mouth daily. 30 tablet 2  . Cholecalciferol (VITAMIN D3 PO) Take by mouth daily.    . cycloSPORINE (RESTASIS) 0.05 % ophthalmic emulsion Place 1 drop into both eyes 2 (two) times daily.    Marland Kitchen gabapentin (NEURONTIN) 300 MG capsule Take 1 capsule (300 mg total) by mouth 3 (three) times daily. 90 capsule 2  . lamoTRIgine (LAMICTAL) 200 MG tablet Take 1 tablet (200 mg total) by mouth 2 (two) times daily. 60 tablet 2  . loratadine (CLARITIN) 10 MG tablet Take 10 mg by mouth daily.    . Lurasidone HCl (LATUDA) 60 MG TABS Take 1 tablet (60 mg total) by mouth daily with supper. 30 tablet 2  . Melatonin 10 MG TABS Take 20 mg by mouth as needed.     . Omega-3 Fatty Acids (OMEGA 3 PO) Take by mouth 2 (two) times daily.    Marland Kitchen omeprazole (PRILOSEC) 20 MG capsule Take 20 mg by mouth daily.    . Sennosides (SENOKOT PO) Take by mouth as needed.    Marland Kitchen tenofovir (VIREAD) 300 MG tablet Take 300 mg by mouth daily.    Marland Kitchen triamcinolone cream (KENALOG) 0.1 % as needed.    . vitamin C (ASCORBIC ACID) 500 MG tablet Take 500 mg by mouth daily.    . Wheat Dextrin (BENEFIBER DRINK MIX) PACK Take 4 g by mouth at bedtime. (Patient taking differently: Take 4 g by mouth as needed. )    . zolpidem (AMBIEN CR) 12.5 MG CR tablet Take 1 tablet (12.5 mg total) by mouth at bedtime as needed for sleep. 30 tablet 2   No current facility-administered medications for this visit.      Musculoskeletal: Strength & Muscle Tone: within normal limits Gait & Station: normal Patient leans: N/A  Psychiatric Specialty Exam: Review of Systems  All other systems reviewed and are negative.   Blood pressure 137/78, pulse 64,  height 5\' 6"  (2.878 m), weight 149 lb (67.6 kg), SpO2 98 %.Body mass index is 24.05 kg/m.  General Appearance: Casual, Neat and Well Groomed  Eye Contact:  Good  Speech:  Clear and  Coherent  Volume:  Normal  Mood:  Euthymic  Affect:  Congruent  Thought Process:  Goal Directed  Orientation:  Full (Time, Place, and Person)  Thought Content: WDL   Suicidal Thoughts:  No  Homicidal Thoughts:  No  Memory:  Immediate;   Good Recent;   Good Remote;   Good  Judgement:  Good  Insight:  Fair  Psychomotor Activity:  Normal  Concentration:  Concentration: Good and Attention Span: Good  Recall:  Good  Fund of Knowledge: Good  Language: Good  Akathisia:  No  Handed:  Right  AIMS (if indicated): not done  Assets:  Communication Skills Desire for Improvement Physical Health Resilience Social Support Talents/Skills  ADL's:  Intact  Cognition: WNL  Sleep:  Good   Screenings: PHQ2-9     Clinical Support from 01/07/2014 in [facility]  PHQ-2 Total Score  0       Assessment and Plan: This patient is a 47 year old male with a history of bipolar disorder.  He is doing much better in terms of tremor and sleep since we made the recent medication adjustments.  He will continue Ambien CR 12.5 mg at bedtime for sleep, Latuda 60 mg daily with supper for mood stabilization, Lamictal 200 mg twice daily for mood stabilization, gabapentin 300 mg 3 times daily for anxiety, Wellbutrin 75 mg daily for depression and Xanax 2 mg 3 times daily for anxiety.  He will return to see me in 2 months   [name], MD 02/14/2018, 1:49 PM

## 2018-04-16 ENCOUNTER — Ambulatory Visit (HOSPITAL_COMMUNITY): Payer: Medicare Other | Admitting: Psychiatry

## 2018-04-16 ENCOUNTER — Encounter (HOSPITAL_COMMUNITY): Payer: Self-pay | Admitting: Psychiatry

## 2018-04-16 VITALS — BP 126/78 | HR 77 | Ht 66.0 in | Wt 148.6 lb

## 2018-04-16 DIAGNOSIS — F3130 Bipolar disorder, current episode depressed, mild or moderate severity, unspecified: Secondary | ICD-10-CM | POA: Diagnosis not present

## 2018-04-16 MED ORDER — BUPROPION HCL ER (XL) 150 MG PO TB24: 150.0000 mg | ORAL_TABLET | ORAL | 2 refills | Status: DC

## 2018-04-16 MED ORDER — ALPRAZOLAM 2 MG PO TABS: 2.0000 mg | ORAL_TABLET | Freq: Three times a day (TID) | ORAL | 2 refills | Status: DC

## 2018-04-16 MED ORDER — LAMOTRIGINE 200 MG PO TABS: 200.0000 mg | ORAL_TABLET | Freq: Two times a day (BID) | ORAL | 2 refills | Status: DC

## 2018-04-16 MED ORDER — GABAPENTIN 300 MG PO CAPS: 300.0000 mg | ORAL_CAPSULE | Freq: Three times a day (TID) | ORAL | 2 refills | Status: DC

## 2018-04-16 MED ORDER — ZOLPIDEM TARTRATE ER 12.5 MG PO TBCR: 12.5000 mg | EXTENDED_RELEASE_TABLET | Freq: Every evening | ORAL | 2 refills | Status: DC | PRN

## 2018-04-16 NOTE — Progress Notes (Signed)
[facility] MD/PA/NP OP Progress Note  04/16/2018 2:42 PM Roger Duran  MRN:  [id number]  Chief Complaint:  Chief Complaint    Depression; Anxiety; Manic Behavior     HPI: This patient is a 47 year old single white male who lives with his grandmother and stones well.  He identifies as gay but is not in a relationship right now.  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  The patient returns after 2 months for treatment of bipolar disorder.  He states that lately he has been more depressed and not enjoying things as much as he used to.  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  It has been more difficult since it got so cold outdoors.  He is sleeping better since we changed him to Ambien CR and his tremor has gotten better with cutting back the Latuda.  However it he thinks the cut back has made him more depressed.  He is not suicidal.  He is looking forward to spending time with family at Christmas.  I suggested we increase his Wellbutrin and he agrees. Visit Diagnosis:    ICD-10-CM   1. Bipolar I disorder, most recent episode depressed ([place]) F31.30     Past Psychiatric History:  hospitalization in 2009 after suicide attempt  Past Medical History:  Past Medical History:  Diagnosis Date  . Anxiety   . Bipolar 1 disorder ([place])   . Depression   . Mania ([place])   . Psoriasis     Past Surgical History:  Procedure Laterality Date  . APPENDECTOMY    . COLONOSCOPY N/A 11/14/2014   Procedure: COLONOSCOPY;  Surgeon: Rogene Houston, MD;  [facility]: [facility];  Service: Endoscopy;  Laterality: N/A;  11:10 - moved to 8:30 - Ann to notify  . TONSILLECTOMY    . WRIST SURGERY      Family Psychiatric History: See below  Family History:  Family History  Problem Relation Age of Onset  . Hyperlipidemia Mother   . Heart disease Father   . Anxiety disorder Father   . Bipolar disorder Maternal Grandfather     Social History:   Social History   Socioeconomic History  . Marital status: Single    Spouse name: Not on file  . Number of children: Not on file  . Years of education: Not on file  . Highest education level: Not on file  Occupational History  . Not on file  Social Needs  . Financial resource strain: Not on file  . Food insecurity:    Worry: Not on file    Inability: Not on file  . Transportation needs:    Medical: Not on file    Non-medical: Not on file  Tobacco Use  . Smoking status: Current Every Day Smoker    Packs/day: 0.50    Types: Cigarettes, E-cigarettes  . Smokeless tobacco: Former Systems developer  . Tobacco comment: Per pt he uses E-cigarettes  Substance and Sexual Activity  . Alcohol use: No    Alcohol/week: 0.0 standard drinks    Comment: rarely  . Drug use: No    Comment: 1995-2000 PER PT HE DID A LOT OF COCAINE AND PILLS AND DRANK A LOT  . Sexual activity: Yes    Partners: Male    Birth control/protection: Condom  Lifestyle  . Physical activity:    Days per week: Not on file    Minutes per session: Not on file  .  Stress: Not on file  Relationships  . Social connections:    Talks on phone: Not on file    Gets together: Not on file    Attends religious service: Not on file    Active member of club or organization: Not on file    Attends meetings of clubs or organizations: Not on file    Relationship status: Not on file  Other Topics Concern  . Not on file  Social History Narrative  . Not on file    Allergies:  Allergies  Allergen Reactions  . Penicillins Itching and Rash    Metabolic Disorder Labs: No results found for: HGBA1C, MPG No results found for: PROLACTIN No results found for: CHOL, TRIG, HDL, CHOLHDL, VLDL, LDLCALC No results found for: TSH  Therapeutic Level Labs: Lab Results  Component Value Date   LITHIUM 1.10 10/15/2014   No results found for: VALPROATE No components found for:  CBMZ  Current Medications: Current Outpatient Medications   Medication Sig Dispense Refill  . acyclovir (ZOVIRAX) 400 MG tablet Take 400 mg by mouth 3 (three) times daily.     . Adalimumab (HUMIRA PEN) 40 MG/0.8ML PNKT     . alprazolam (XANAX) 2 MG tablet Take 1 tablet (2 mg total) by mouth 3 (three) times daily. 90 tablet 2  . Cholecalciferol (VITAMIN D3 PO) Take by mouth daily.    . cycloSPORINE (RESTASIS) 0.05 % ophthalmic emulsion Place 1 drop into both eyes 2 (two) times daily.    Marland Kitchen gabapentin (NEURONTIN) 300 MG capsule Take 1 capsule (300 mg total) by mouth 3 (three) times daily. 90 capsule 2  . lamoTRIgine (LAMICTAL) 200 MG tablet Take 1 tablet (200 mg total) by mouth 2 (two) times daily. 60 tablet 2  . loratadine (CLARITIN) 10 MG tablet Take 10 mg by mouth daily.    . Lurasidone HCl (LATUDA) 60 MG TABS Take 1 tablet (60 mg total) by mouth daily with supper. 30 tablet 2  . Melatonin 10 MG TABS Take 20 mg by mouth as needed.     . Omega-3 Fatty Acids (OMEGA 3 PO) Take by mouth 2 (two) times daily.    Marland Kitchen omeprazole (PRILOSEC) 20 MG capsule Take 20 mg by mouth daily.    . Sennosides (SENOKOT PO) Take by mouth as needed.    Marland Kitchen tenofovir (VIREAD) 300 MG tablet Take 300 mg by mouth daily.    Marland Kitchen triamcinolone cream (KENALOG) 0.1 % as needed.    . vitamin C (ASCORBIC ACID) 500 MG tablet Take 500 mg by mouth daily.    . Wheat Dextrin (BENEFIBER DRINK MIX) PACK Take 4 g by mouth at bedtime. (Patient taking differently: Take 4 g by mouth as needed. )    . zolpidem (AMBIEN CR) 12.5 MG CR tablet Take 1 tablet (12.5 mg total) by mouth at bedtime as needed for sleep. 30 tablet 2  . buPROPion (WELLBUTRIN XL) 150 MG 24 hr tablet Take 1 tablet (150 mg total) by mouth every morning. 30 tablet 2   No current facility-administered medications for this visit.      Musculoskeletal: Strength & Muscle Tone: within normal limits Gait & Station: normal Patient leans: N/A  Psychiatric Specialty Exam: Review of Systems  Psychiatric/Behavioral: Positive for  depression.  All other systems reviewed and are negative.   Blood pressure 126/78, pulse 77, height 5\' 6"  (1.676 m), weight 148 lb 9.6 oz (67.4 kg), SpO2 97 %.Body mass index is 23.98 kg/m.  General Appearance: Casual, Neat  and Well Groomed  Eye Contact:  Good  Speech:  Clear and Coherent  Volume:  Normal  Mood:  Dysphoric  Affect:  Constricted  Thought Process:  Goal Directed  Orientation:  Full (Time, Place, and Person)  Thought Content: Rumination   Suicidal Thoughts:  No  Homicidal Thoughts:  No  Memory:  Immediate;   Good Recent;   Good Remote;   Good  Judgement:  Fair  Insight:  Fair  Psychomotor Activity:  Normal  Concentration:  Concentration: Good and Attention Span: Good  Recall:  Good  Fund of Knowledge: Good  Language: Good  Akathisia:  No  Handed:  Right  AIMS (if indicated): not done  Assets:  Communication Skills Desire for Improvement Resilience Social Support Talents/Skills  ADL's:  Intact  Cognition: WNL  Sleep:  Good   Screenings: PHQ2-9     Clinical Support from 01/07/2014 in [place]  PHQ-2 Total Score  0       Assessment and Plan: This patient is a 47 year old male with a history of bipolar disorder and anxiety.  He has been a little bit more depressed recently which may have something to do with the time of year.  We will increase Wellbutrin to Wellbutrin XL 150 mg daily.  He will continue Ambien CR 12.5 mg at bedtime for sleep, Latuda 60 mg with supper for mood stabilization, Lamictal 200 mg twice daily for mood stabilization, gabapentin 300 mg 3 times daily for anxiety, Xanax 2 mg 3 times daily for anxiety.  He will return to see me in 6 weeks   [name], MD 04/16/2018, 2:42 PM

## 2018-04-30 ENCOUNTER — Telehealth (HOSPITAL_COMMUNITY): Payer: Self-pay | Admitting: *Deleted

## 2018-04-30 ENCOUNTER — Other Ambulatory Visit (HOSPITAL_COMMUNITY): Payer: Self-pay | Admitting: Psychiatry

## 2018-04-30 MED ORDER — BUPROPION HCL 75 MG PO TABS: 75.0000 mg | ORAL_TABLET | Freq: Every day | ORAL | 2 refills | Status: DC

## 2018-04-30 NOTE — Telephone Encounter (Signed)
Tell him to stop Wellbutrin xl 150 and go back to wellbutrin 75 mg daily. I have sent in script

## 2018-04-30 NOTE — Telephone Encounter (Signed)
Dr Harrington Challenger Patient called  & stated that the Wellbutrin is making him feel more depressed , very lethargic, falls asleep with eyes open watching TV. He stated he feels more crazy than Already . NO SI. He wishes he was dead but would never harm/hurt himself. [phone]. Next office visit is 05/30/18

## 2018-05-30 ENCOUNTER — Ambulatory Visit (HOSPITAL_COMMUNITY): Payer: Medicare Other | Admitting: Psychiatry

## 2018-06-04 ENCOUNTER — Ambulatory Visit (INDEPENDENT_AMBULATORY_CARE_PROVIDER_SITE_OTHER): Payer: Medicare Other | Admitting: Psychiatry

## 2018-06-04 ENCOUNTER — Encounter (HOSPITAL_COMMUNITY): Payer: Self-pay | Admitting: Psychiatry

## 2018-06-04 VITALS — BP 150/90 | HR 77 | Ht 66.0 in | Wt 147.0 lb

## 2018-06-04 DIAGNOSIS — F3130 Bipolar disorder, current episode depressed, mild or moderate severity, unspecified: Secondary | ICD-10-CM

## 2018-06-04 MED ORDER — LAMOTRIGINE 200 MG PO TABS: 200.0000 mg | ORAL_TABLET | Freq: Two times a day (BID) | ORAL | 2 refills | Status: DC

## 2018-06-04 MED ORDER — ALPRAZOLAM 2 MG PO TABS: 2.0000 mg | ORAL_TABLET | Freq: Three times a day (TID) | ORAL | 2 refills | Status: DC

## 2018-06-04 MED ORDER — ZOLPIDEM TARTRATE ER 12.5 MG PO TBCR: 12.5000 mg | EXTENDED_RELEASE_TABLET | Freq: Every evening | ORAL | 2 refills | Status: DC | PRN

## 2018-06-04 MED ORDER — LURASIDONE HCL 60 MG PO TABS: 60.0000 mg | ORAL_TABLET | Freq: Every day | ORAL | 2 refills | Status: DC

## 2018-06-04 MED ORDER — VORTIOXETINE HBR 10 MG PO TABS: 10.0000 mg | ORAL_TABLET | Freq: Every day | ORAL | 2 refills | Status: DC

## 2018-06-04 NOTE — Progress Notes (Signed)
[place] MD/PA/NP OP Progress Note  06/04/2018 1:56 PM Roger Duran  MRN:  [id number]  Chief Complaint:  Chief Complaint    Anxiety; Depression; Manic Behavior; Follow-up     HPI: This patient is a 48 year old single white male who lives with his grandmother in [place].  He identifies as gay but is not in a relationship right now.  He is currently on disability for bipolar disorder.  The patient returns after 6 weeks for treatment of bipolar disorder.  He mentioned last time that he felt more depressed and we tried increasing his Wellbutrin.  However this made his hands shake terribly and he became even more depressed.  He still feels sad most of the time.  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  He does not really have any social life and spends all of his time with his grandmother other family members.  Most the time he is sleeping well.  His energy however seems somewhat low as his interest and motivation.  I suggested that we stop Wellbutrin and switch to Trintellix and he is willing to give this a try.  He denies ideation Visit Diagnosis:    ICD-10-CM   1. Bipolar I disorder, most recent episode depressed ([place]) F31.30     Past Psychiatric History: Hospitalization in 2009 after suicide attempt  Past Medical History:  Past Medical History:  Diagnosis Date  . Anxiety   . Bipolar 1 disorder ([place])   . Depression   . Mania ([place])   . Psoriasis     Past Surgical History:  Procedure Laterality Date  . APPENDECTOMY    . COLONOSCOPY N/A 11/14/2014   Procedure: COLONOSCOPY;  Surgeon: Rogene Houston, MD;  [facility];  Service: Endoscopy;  Laterality: N/A;  11:10 - moved to 8:30 - Ann to notify  . TONSILLECTOMY    . WRIST SURGERY      Family Psychiatric History: See below  Family History:  Family History  Problem Relation Age of Onset  . Hyperlipidemia Mother   . Heart disease Father   . Anxiety disorder  Father   . Bipolar disorder Maternal Grandfather     Social History:  Social History   Socioeconomic History  . Marital status: Single    Spouse name: Not on file  . Number of children: Not on file  . Years of education: Not on file  . Highest education level: Not on file  Occupational History  . Not on file  Social Needs  . Financial resource strain: Not on file  . Food insecurity:    Worry: Not on file    Inability: Not on file  . Transportation needs:    Medical: Not on file    Non-medical: Not on file  Tobacco Use  . Smoking status: Current Every Day Smoker    Packs/day: 0.50    Types: Cigarettes, E-cigarettes  . Smokeless tobacco: Former Systems developer  . Tobacco comment: Per pt he uses E-cigarettes  Substance and Sexual Activity  . Alcohol use: No    Alcohol/week: 0.0 standard drinks    Comment: rarely  . Drug use: No    Comment: 1995-2000 PER PT HE DID A LOT OF COCAINE AND PILLS AND DRANK A LOT  . Sexual activity: Yes    Partners: Male    Birth control/protection: Condom  Lifestyle  . Physical activity:    Days per week: Not on  file    Minutes per session: Not on file  . Stress: Not on file  Relationships  . Social connections:    Talks on phone: Not on file    Gets together: Not on file    Attends religious service: Not on file    Active member of club or organization: Not on file    Attends meetings of clubs or organizations: Not on file    Relationship status: Not on file  Other Topics Concern  . Not on file  Social History Narrative  . Not on file    Allergies:  Allergies  Allergen Reactions  . Penicillins Itching and Rash    Metabolic Disorder Labs: No results found for: HGBA1C, MPG No results found for: PROLACTIN No results found for: CHOL, TRIG, HDL, CHOLHDL, VLDL, LDLCALC No results found for: TSH  Therapeutic Level Labs: Lab Results  Component Value Date   LITHIUM 1.10 10/15/2014   No results found for: VALPROATE No components found  for:  CBMZ  Current Medications: Current Outpatient Medications  Medication Sig Dispense Refill  . acyclovir (ZOVIRAX) 400 MG tablet Take 400 mg by mouth 3 (three) times daily.     . Adalimumab (HUMIRA PEN) 40 MG/0.8ML PNKT     . alprazolam (XANAX) 2 MG tablet Take 1 tablet (2 mg total) by mouth 3 (three) times daily. 90 tablet 2  . Cholecalciferol (VITAMIN D3 PO) Take by mouth daily.    . cycloSPORINE (RESTASIS) 0.05 % ophthalmic emulsion Place 1 drop into both eyes 2 (two) times daily.    Marland Kitchen gabapentin (NEURONTIN) 300 MG capsule Take 1 capsule (300 mg total) by mouth 3 (three) times daily. 90 capsule 2  . lamoTRIgine (LAMICTAL) 200 MG tablet Take 1 tablet (200 mg total) by mouth 2 (two) times daily. 60 tablet 2  . loratadine (CLARITIN) 10 MG tablet Take 10 mg by mouth daily.    . Lurasidone HCl (LATUDA) 60 MG TABS Take 1 tablet (60 mg total) by mouth daily with supper. 30 tablet 2  . Omega-3 Fatty Acids (OMEGA 3 PO) Take by mouth 2 (two) times daily.    Marland Kitchen omeprazole (PRILOSEC) 20 MG capsule Take 20 mg by mouth daily.    . Sennosides (SENOKOT PO) Take by mouth as needed.    Marland Kitchen tenofovir (VIREAD) 300 MG tablet Take 300 mg by mouth daily.    . Wheat Dextrin (BENEFIBER DRINK MIX) PACK Take 4 g by mouth at bedtime. (Patient taking differently: Take 4 g by mouth as needed. )    . zolpidem (AMBIEN CR) 12.5 MG CR tablet Take 1 tablet (12.5 mg total) by mouth at bedtime as needed for sleep. 30 tablet 2  . vitamin C (ASCORBIC ACID) 500 MG tablet Take 1,000 mg by mouth daily.     Marland Kitchen vortioxetine HBr (TRINTELLIX) 10 MG TABS tablet Take 1 tablet (10 mg total) by mouth daily. 30 tablet 2   No current facility-administered medications for this visit.      Musculoskeletal: Strength & Muscle Tone: within normal limits Gait & Station: normal Patient leans: N/A  Psychiatric Specialty Exam: Review of Systems  Psychiatric/Behavioral: Positive for depression.  All other systems reviewed and are  negative.   Blood pressure (!) 150/90, pulse 77, height 5\' 6"  (1.676 m), weight 147 lb (66.7 kg), SpO2 98 %.Body mass index is 23.73 kg/m.  General Appearance: Casual, Neat and Well Groomed  Eye Contact:  Good  Speech:  Clear and Coherent  Volume:  Normal  Mood:  Dysphoric  Affect:  Constricted and Tearful  Thought Process:  Goal Directed  Orientation:  Full (Time, Place, and Person)  Thought Content: Rumination   Suicidal Thoughts:  No  Homicidal Thoughts:  No  Memory:  Immediate;   Good Recent;   Good Remote;   Good  Judgement:  Good  Insight:  Good  Psychomotor Activity:  Decreased  Concentration:  Concentration: Good and Attention Span: Good  Recall:  Good  Fund of Knowledge: Good  Language: Good  Akathisia:  No  Handed:  Right  AIMS (if indicated): not done  Assets:  Communication Skills Desire for Improvement Resilience Social Support Talents/Skills  ADL's:  Intact  Cognition: WNL  Sleep:  Good   Screenings: PHQ2-9     Clinical Support from 01/07/2014 in [employer]  PHQ-2 Total Score  0       Assessment and Plan:  This patient is a 48 year old male with a history of bipolar disorder and anxiety.  Since he is more depressed recently we will discontinue Wellbutrin and start Trintellix 5 mg daily for 1 week and then advance to 10 mg daily.  He will continue Xanax 2 mg 3 times daily for anxiety gabapentin 300 mg 3 times daily for anxiety, Lamictal 200 mg twice daily for mood stabilization Latuda 60 mg daily also for mood stabilization and Ambien CR 12.5 mg at bedtime for sleep.  He will return to see me in 4 weeks  [name], MD 06/04/2018, 1:56 PM

## 2018-07-03 ENCOUNTER — Ambulatory Visit (HOSPITAL_COMMUNITY): Payer: Medicare Other | Admitting: Psychiatry

## 2018-07-24 ENCOUNTER — Encounter (HOSPITAL_COMMUNITY): Payer: Self-pay | Admitting: Psychiatry

## 2018-07-24 ENCOUNTER — Other Ambulatory Visit: Payer: Self-pay

## 2018-07-24 ENCOUNTER — Ambulatory Visit (INDEPENDENT_AMBULATORY_CARE_PROVIDER_SITE_OTHER): Payer: Medicare Other | Admitting: Psychiatry

## 2018-07-24 VITALS — BP 135/89 | HR 77 | Ht 66.0 in | Wt 150.0 lb

## 2018-07-24 DIAGNOSIS — F3130 Bipolar disorder, current episode depressed, mild or moderate severity, unspecified: Secondary | ICD-10-CM

## 2018-07-24 DIAGNOSIS — Z79899 Other long term (current) drug therapy: Secondary | ICD-10-CM | POA: Diagnosis not present

## 2018-07-24 MED ORDER — LAMOTRIGINE 200 MG PO TABS: 200.0000 mg | ORAL_TABLET | Freq: Two times a day (BID) | ORAL | 2 refills | Status: DC

## 2018-07-24 MED ORDER — ALPRAZOLAM 2 MG PO TABS: 2.0000 mg | ORAL_TABLET | Freq: Three times a day (TID) | ORAL | 2 refills | Status: DC

## 2018-07-24 MED ORDER — PROPRANOLOL HCL 10 MG PO TABS: 10.0000 mg | ORAL_TABLET | Freq: Three times a day (TID) | ORAL | 2 refills | Status: DC

## 2018-07-24 MED ORDER — GABAPENTIN 300 MG PO CAPS: 300.0000 mg | ORAL_CAPSULE | Freq: Three times a day (TID) | ORAL | 2 refills | Status: DC

## 2018-07-24 MED ORDER — LURASIDONE HCL 40 MG PO TABS: 40.0000 mg | ORAL_TABLET | Freq: Every day | ORAL | 2 refills | Status: DC

## 2018-07-24 MED ORDER — ZOLPIDEM TARTRATE ER 12.5 MG PO TBCR: 12.5000 mg | EXTENDED_RELEASE_TABLET | Freq: Every evening | ORAL | 2 refills | Status: DC | PRN

## 2018-07-24 MED ORDER — VORTIOXETINE HBR 10 MG PO TABS: 10.0000 mg | ORAL_TABLET | Freq: Every day | ORAL | 2 refills | Status: DC

## 2018-07-24 NOTE — Progress Notes (Signed)
[facility] MD/PA/NP OP Progress Note  07/24/2018 3:02 PM DENYM Duran  MRN:  [id number]  Chief Complaint:  Chief Complaint    Manic Behavior; Anxiety; Depression; Follow-up     HPI: This patient is a 48 year old single white male who lives with his grandmother in [place].  He identifies as gay but is not in a relationship right now.  He is currently on disability for bipolar disorder.  The patient returns after about 6 weeks for treatment of bipolar disorder.  He states that about a month ago he woke up and had of very stiff back.  He was unable to move.  He since seen his nurse practitioner had an x-ray and MRI which shows some compression at L5-S1.  He is pain and tingling radiating down his right leg.  Last time we switched him from Wellbutrin to Trintellix 10 mg.  He states this is really helped his mood but unfortunately the shaking tremor in his right hand has come back pretty severely.  I suggested that we add Inderal.  I had to take away the Trintellix since it has helped him.  We can also decrease the Latuda.  He denies serious depression or suicidal ideation.  He is sleeping fairly well.  He is primarily focused on his back pain today Visit Diagnosis:    ICD-10-CM   1. Bipolar I disorder, most recent episode depressed ([name]) F31.30     Past Psychiatric History: Hospitalization in 2009 after suicide attempt.  Past Medical History:  Past Medical History:  Diagnosis Date  . Anxiety   . Bipolar 1 disorder ([place])   . Depression   . Mania ([name])   . Psoriasis     Past Surgical History:  Procedure Laterality Date  . APPENDECTOMY    . COLONOSCOPY N/A 11/14/2014   Procedure: COLONOSCOPY;  Surgeon: Rogene Houston, MD;  [facility];  Service: Endoscopy;  Laterality: N/A;  11:10 - moved to 8:30 - Ann to notify  . TONSILLECTOMY    . WRIST SURGERY      Family Psychiatric History: see below  Family History:  Family History  Problem Relation Age of Onset  . Hyperlipidemia  Mother   . Heart disease Father   . Anxiety disorder Father   . Bipolar disorder Maternal Grandfather     Social History:  Social History   Socioeconomic History  . Marital status: Single    Spouse name: Not on file  . Number of children: Not on file  . Years of education: Not on file  . Highest education level: Not on file  Occupational History  . Not on file  Social Needs  . Financial resource strain: Not on file  . Food insecurity:    Worry: Not on file    Inability: Not on file  . Transportation needs:    Medical: Not on file    Non-medical: Not on file  Tobacco Use  . Smoking status: Current Every Day Smoker    Packs/day: 0.50    Types: Cigarettes, E-cigarettes  . Smokeless tobacco: Former Systems developer  . Tobacco comment: Per pt he uses E-cigarettes  Substance and Sexual Activity  . Alcohol use: No    Alcohol/week: 0.0 standard drinks    Comment: rarely  . Drug use: No    Comment: 1995-2000 PER PT HE DID A LOT OF COCAINE AND PILLS AND DRANK A LOT  . Sexual activity: Yes    Partners: Male    Birth control/protection: Condom  Lifestyle  . Physical activity:    Days per week: Not on file    Minutes per session: Not on file  . Stress: Not on file  Relationships  . Social connections:    Talks on phone: Not on file    Gets together: Not on file    Attends religious service: Not on file    Active member of club or organization: Not on file    Attends meetings of clubs or organizations: Not on file    Relationship status: Not on file  Other Topics Concern  . Not on file  Social History Narrative  . Not on file    Allergies:  Allergies  Allergen Reactions  . Penicillins Itching and Rash    Metabolic Disorder Labs: No results found for: HGBA1C, MPG No results found for: PROLACTIN No results found for: CHOL, TRIG, HDL, CHOLHDL, VLDL, LDLCALC No results found for: TSH  Therapeutic Level Labs: Lab Results  Component Value Date   LITHIUM 1.10 10/15/2014    No results found for: VALPROATE No components found for:  CBMZ  Current Medications: Current Outpatient Medications  Medication Sig Dispense Refill  . acyclovir (ZOVIRAX) 400 MG tablet Take 400 mg by mouth 3 (three) times daily.     . Adalimumab (HUMIRA PEN) 40 MG/0.8ML PNKT     . alprazolam (XANAX) 2 MG tablet Take 1 tablet (2 mg total) by mouth 3 (three) times daily. 90 tablet 2  . Cholecalciferol (VITAMIN D3 PO) Take by mouth daily.    . cycloSPORINE (RESTASIS) 0.05 % ophthalmic emulsion Place 1 drop into both eyes 2 (two) times daily.    Marland Kitchen gabapentin (NEURONTIN) 300 MG capsule Take 1 capsule (300 mg total) by mouth 3 (three) times daily. 90 capsule 2  . lamoTRIgine (LAMICTAL) 200 MG tablet Take 1 tablet (200 mg total) by mouth 2 (two) times daily. 60 tablet 2  . loratadine (CLARITIN) 10 MG tablet Take 10 mg by mouth daily.    . Omega-3 Fatty Acids (OMEGA 3 PO) Take by mouth 2 (two) times daily.    Marland Kitchen omeprazole (PRILOSEC) 20 MG capsule Take 20 mg by mouth daily.    . Sennosides (SENOKOT PO) Take by mouth as needed.    Marland Kitchen tenofovir (VIREAD) 300 MG tablet Take 300 mg by mouth daily.    . vitamin C (ASCORBIC ACID) 500 MG tablet Take 1,000 mg by mouth daily.     Marland Kitchen vortioxetine HBr (TRINTELLIX) 10 MG TABS tablet Take 1 tablet (10 mg total) by mouth daily. 30 tablet 2  . Wheat Dextrin (BENEFIBER DRINK MIX) PACK Take 4 g by mouth at bedtime. (Patient taking differently: Take 4 g by mouth as needed. )    . zolpidem (AMBIEN CR) 12.5 MG CR tablet Take 1 tablet (12.5 mg total) by mouth at bedtime as needed for sleep. 30 tablet 2  . lurasidone (LATUDA) 40 MG TABS tablet Take 1 tablet (40 mg total) by mouth daily with breakfast. 30 tablet 2  . propranolol (INDERAL) 10 MG tablet Take 1 tablet (10 mg total) by mouth 3 (three) times daily. 90 tablet 2   No current facility-administered medications for this visit.      Musculoskeletal: Strength & Muscle Tone: within normal limits Gait & Station:  normal Patient leans: N/A  Psychiatric Specialty Exam: Review of Systems  Musculoskeletal: Positive for back pain.  Neurological: Positive for tremors.  Psychiatric/Behavioral: The patient is nervous/anxious.   All other systems reviewed and are negative.  Blood pressure 135/89, pulse 77, height 5\' 6"  (1.676 m), weight 150 lb (68 kg), SpO2 97 %.Body mass index is 24.21 kg/m.  General Appearance: Casual, Neat and Well Groomed  Eye Contact:  Good  Speech:  Clear and Coherent  Volume:  Normal  Mood:  Anxious  Affect:  Congruent and Flat  Thought Process:  Goal Directed  Orientation:  Full (Time, Place, and Person)  Thought Content: Rumination   Suicidal Thoughts:  No  Homicidal Thoughts:  No  Memory:  Immediate;   Good Recent;   Good Remote;   Good  Judgement:  Good  Insight:  Fair  Psychomotor Activity:  Tremor  Concentration:  Concentration: Good and Attention Span: Good  Recall:  Good  Fund of Knowledge: Good  Language: Good  Akathisia:  No  Handed:  Right  AIMS (if indicated): not done  Assets:  Communication Skills Desire for Improvement Physical Health Resilience Social Support  ADL's:  Intact  Cognition: WNL  Sleep:  Good   Screenings: PHQ2-9     Clinical Support from 01/07/2014 in [place]  PHQ-2 Total Score  0       Assessment and Plan: This patient is a 48 year old male with a history of bipolar disorder and anxiety.  Unfortunately he is now suffering from significant back pain.  He also has a tremor which may be related to the new antidepressant.  He does not really want to stop it nor do I because it has improved his mood.  We will add propranolol 10 mg 3 times daily for tremor, continue Trintellix 10 mg daily for depression, Lamictal 200 mg twice daily for mood stabilization gabapentin 300 mg 3 times daily for anxiety and cut Latuda to 40 mg daily for mood stabilization.  He will also continue Xanax 2 mg 3 times daily for anxiety  and Ambien CR 12.5 mg at bedtime for sleep.  He will return to see me in 2 months but call sooner if the tremor is not alleviated   [name], MD 07/24/2018, 3:02 PM

## 2018-09-10 ENCOUNTER — Telehealth (HOSPITAL_COMMUNITY): Payer: Self-pay | Admitting: *Deleted

## 2018-09-10 NOTE — Telephone Encounter (Signed)
ok 

## 2018-09-10 NOTE — Telephone Encounter (Signed)
Dr Harrington Challenger Patient called & said he just wanted to let you know that the propanol has helped with the tremors & that he has increased the [place] back up to  60 mg(he said some extra on hand) & it's helping him. He says he's sorry but he feels better. Next visit 09/25/2018

## 2018-09-25 ENCOUNTER — Encounter (HOSPITAL_COMMUNITY): Payer: Self-pay | Admitting: Psychiatry

## 2018-09-25 ENCOUNTER — Other Ambulatory Visit: Payer: Self-pay

## 2018-09-25 ENCOUNTER — Ambulatory Visit (INDEPENDENT_AMBULATORY_CARE_PROVIDER_SITE_OTHER): Payer: Medicare Other | Admitting: Psychiatry

## 2018-09-25 DIAGNOSIS — F3130 Bipolar disorder, current episode depressed, mild or moderate severity, unspecified: Secondary | ICD-10-CM

## 2018-09-25 MED ORDER — ZOLPIDEM TARTRATE ER 12.5 MG PO TBCR: 12.5000 mg | EXTENDED_RELEASE_TABLET | Freq: Every evening | ORAL | 2 refills | Status: DC | PRN

## 2018-09-25 MED ORDER — ALPRAZOLAM 2 MG PO TABS: 2.0000 mg | ORAL_TABLET | Freq: Three times a day (TID) | ORAL | 2 refills | Status: DC

## 2018-09-25 MED ORDER — LAMOTRIGINE 200 MG PO TABS: 200.0000 mg | ORAL_TABLET | Freq: Two times a day (BID) | ORAL | 2 refills | Status: DC

## 2018-09-25 MED ORDER — LURASIDONE HCL 60 MG PO TABS: 60.0000 mg | ORAL_TABLET | Freq: Every day | ORAL | 2 refills | Status: DC

## 2018-09-25 MED ORDER — PROPRANOLOL HCL 10 MG PO TABS: 10.0000 mg | ORAL_TABLET | Freq: Three times a day (TID) | ORAL | 2 refills | Status: DC

## 2018-09-25 MED ORDER — VORTIOXETINE HBR 10 MG PO TABS: 10.0000 mg | ORAL_TABLET | Freq: Every day | ORAL | 2 refills | Status: DC

## 2018-09-25 NOTE — Progress Notes (Signed)
Virtual Visit via Video Note  I connected with Roger Duran on 09/25/18 at  2:20 PM EDT by a video enabled telemedicine application and verified that I am speaking with the correct person using two identifiers.   I discussed the limitations of evaluation and management by telemedicine and the availability of in person appointments. The patient expressed understanding and agreed to proceed.      I discussed the assessment and treatment plan with the patient. The patient was provided an opportunity to ask questions and all were answered. The patient agreed with the plan and demonstrated an understanding of the instructions.   The patient was advised to call back or seek an in-person evaluation if the symptoms worsen or if the condition fails to improve as anticipated.  I provided 15 minutes of non-face-to-face time during this encounter.   [name], MD  [facility] MD/PA/NP OP Progress Note  09/25/2018 2:34 PM Roger Duran  MRN:  [id number]  Chief Complaint:  Chief Complaint    Depression; Anxiety; Manic Behavior; Follow-up     HPI: This patient is a 48 year old single white male who lives with his grandmother in [place].  He identifies as gay but is not in a relationship right now.  He is currently on disability for bipolar disorder  The patient returns after about 2 months.  He is seen via telemedicine because of the coronavirus pandemic.  He is still having a lot of trouble with his back.  He has had 2 fluoroscopic injections done the last one being about a week ago.  He states that is starting to help a little bit and he is also going to physical therapy.  He still having a lot of tingling and pain down his right leg.  He asks if I can increase gabapentin more but since it is being used now more for pain I suggested he talk to his orthopedic or primary doctor about this.  The shaking tremor in his hands is much better with the addition of propranolol and he has gone back to the  Latuda 60 mg.  When he was on the 40 mg he states his moods were "all over the place."  He denies serious depression suicidal ideation or panic attacks.  He is sleeping okay but sometimes wakes up through the night but then takes a nap during the day. Visit Diagnosis:    ICD-10-CM   1. Bipolar I disorder, most recent episode depressed ([place]) F31.30     Past Psychiatric History: Hospitalization in 2009 after suicide attempt  Past Medical History:  Past Medical History:  Diagnosis Date  . Anxiety   . Bipolar 1 disorder ([place])   . Depression   . Mania ([place])   . Psoriasis     Past Surgical History:  Procedure Laterality Date  . APPENDECTOMY    . COLONOSCOPY N/A 11/14/2014   Procedure: COLONOSCOPY;  Surgeon: Rogene Houston, MD;  [facility];  Service: Endoscopy;  Laterality: N/A;  11:10 - moved to 8:30 - Ann to notify  . TONSILLECTOMY    . WRIST SURGERY      Family Psychiatric History: See below  Family History:  Family History  Problem Relation Age of Onset  . Hyperlipidemia Mother   . Heart disease Father   . Anxiety disorder Father   . Bipolar disorder Maternal Grandfather     Social History:  Social History   Socioeconomic History  . Marital status: Single    Spouse name:  Not on file  . Number of children: Not on file  . Years of education: Not on file  . Highest education level: Not on file  Occupational History  . Not on file  Social Needs  . Financial resource strain: Not on file  . Food insecurity:    Worry: Not on file    Inability: Not on file  . Transportation needs:    Medical: Not on file    Non-medical: Not on file  Tobacco Use  . Smoking status: Current Every Day Smoker    Packs/day: 0.50    Types: Cigarettes, E-cigarettes  . Smokeless tobacco: Former Systems developer  . Tobacco comment: Per pt he uses E-cigarettes  Substance and Sexual Activity  . Alcohol use: No    Alcohol/week: 0.0 standard drinks    Comment: rarely  . Drug use: No     Comment: 1995-2000 PER PT HE DID A LOT OF COCAINE AND PILLS AND DRANK A LOT  . Sexual activity: Yes    Partners: Male    Birth control/protection: Condom  Lifestyle  . Physical activity:    Days per week: Not on file    Minutes per session: Not on file  . Stress: Not on file  Relationships  . Social connections:    Talks on phone: Not on file    Gets together: Not on file    Attends religious service: Not on file    Active member of club or organization: Not on file    Attends meetings of clubs or organizations: Not on file    Relationship status: Not on file  Other Topics Concern  . Not on file  Social History Narrative  . Not on file    Allergies:  Allergies  Allergen Reactions  . Penicillins Itching and Rash    Metabolic Disorder Labs: No results found for: HGBA1C, MPG No results found for: PROLACTIN No results found for: CHOL, TRIG, HDL, CHOLHDL, VLDL, LDLCALC No results found for: TSH  Therapeutic Level Labs: Lab Results  Component Value Date   LITHIUM 1.10 10/15/2014   No results found for: VALPROATE No components found for:  CBMZ  Current Medications: Current Outpatient Medications  Medication Sig Dispense Refill  . tiZANidine (ZANAFLEX) 4 MG tablet 1/2-1 po tid prn spasm    . acyclovir (ZOVIRAX) 400 MG tablet Take 400 mg by mouth 3 (three) times daily.     . Adalimumab (HUMIRA PEN) 40 MG/0.8ML PNKT     . alprazolam (XANAX) 2 MG tablet Take 1 tablet (2 mg total) by mouth 3 (three) times daily. 90 tablet 2  . Cholecalciferol (VITAMIN D3 PO) Take by mouth daily.    . cycloSPORINE (RESTASIS) 0.05 % ophthalmic emulsion Place 1 drop into both eyes 2 (two) times daily.    Marland Kitchen gabapentin (NEURONTIN) 300 MG capsule Take 1 capsule (300 mg total) by mouth 3 (three) times daily. 90 capsule 2  . lamoTRIgine (LAMICTAL) 200 MG tablet Take 1 tablet (200 mg total) by mouth 2 (two) times daily. 60 tablet 2  . loratadine (CLARITIN) 10 MG tablet Take 10 mg by mouth daily.     . Lurasidone HCl (LATUDA) 60 MG TABS Take 1 tablet (60 mg total) by mouth daily. 30 tablet 2  . Omega-3 Fatty Acids (OMEGA 3 PO) Take by mouth 2 (two) times daily.    Marland Kitchen omeprazole (PRILOSEC) 20 MG capsule Take 20 mg by mouth daily.    . propranolol (INDERAL) 10 MG tablet Take 1 tablet (  10 mg total) by mouth 3 (three) times daily. 90 tablet 2  . Sennosides (SENOKOT PO) Take by mouth as needed.    Marland Kitchen tenofovir (VIREAD) 300 MG tablet Take 300 mg by mouth daily.    . vitamin C (ASCORBIC ACID) 500 MG tablet Take 1,000 mg by mouth daily.     Marland Kitchen vortioxetine HBr (TRINTELLIX) 10 MG TABS tablet Take 1 tablet (10 mg total) by mouth daily. 30 tablet 2  . Wheat Dextrin (BENEFIBER DRINK MIX) PACK Take 4 g by mouth at bedtime. (Patient taking differently: Take 4 g by mouth as needed. )    . zolpidem (AMBIEN CR) 12.5 MG CR tablet Take 1 tablet (12.5 mg total) by mouth at bedtime as needed for sleep. 30 tablet 2   No current facility-administered medications for this visit.      Musculoskeletal: Strength & Muscle Tone: within normal limits Gait & Station: normal Patient leans: N/A  Psychiatric Specialty Exam: Review of Systems  Musculoskeletal: Positive for back pain and joint pain.  Neurological: Positive for tingling and tremors.  All other systems reviewed and are negative.   There were no vitals taken for this visit.There is no height or weight on file to calculate BMI.  General Appearance: Casual and Fairly Groomed  Eye Contact:  Good  Speech:  Clear and Coherent  Volume:  Normal  Mood:  Euthymic  Affect:  Appropriate and Congruent  Thought Process:  Goal Directed  Orientation:  Full (Time, Place, and Person)  Thought Content: Rumination   Suicidal Thoughts:  No  Homicidal Thoughts:  No  Memory:  Immediate;   Good Recent;   Good Remote;   Good  Judgement:  Good  Insight:  Good  Psychomotor Activity:  Decreased  Concentration:  Concentration: Good and Attention Span: Good  Recall:   Good  Fund of Knowledge: Good  Language: Good  Akathisia:  No  Handed:  Right  AIMS (if indicated): not done  Assets:  Communication Skills Desire for Improvement Resilience Social Support Talents/Skills  ADL's:  Intact  Cognition: WNL  Sleep:  Fair   Screenings: PHQ2-9     Clinical Support from 01/07/2014 in [place]  PHQ-2 Total Score  0       Assessment and Plan: This patient is a 48 year old male with a history of bipolar disorder.  He seems to be doing fairly well on his current regimen despite the back pain.  He will continue Xanax 2 mg 3 times daily for anxiety, Lamictal 200 mg twice daily for mood stabilization, Latuda 60 mg daily for mood stabilization, Inderal 10 mg 3 times daily for tremor and Ambien CR 12.5 mg at bedtime for sleep.  He will return to see me in 2 months   [name], MD 09/25/2018, 2:34 PM

## 2018-10-17 ENCOUNTER — Telehealth (HOSPITAL_COMMUNITY): Payer: Self-pay | Admitting: Psychiatry

## 2018-10-17 NOTE — Telephone Encounter (Signed)
Zigmund Daniel a Nurse Practitioner from Orthopedics called and said that her office has started the patient on Gabapentin 600mg  four times per day for his lower back radicular pain that radiates down his legs.   This dosage has really helped the patient deal with the pain that he has been experiencing.   The nurse practitioner wanted to know will you continue the patient on the medication or discontinue the medication?   Please advise

## 2018-10-17 NOTE — Telephone Encounter (Signed)
If they are prescribing it to treat pain they will need to prescibe it. I am in agreement with continuing it

## 2018-10-17 NOTE — Telephone Encounter (Signed)
Called and spoke with Roger Duran informed her of the message.  [phone]

## 2018-11-26 ENCOUNTER — Other Ambulatory Visit: Payer: Self-pay

## 2018-11-26 ENCOUNTER — Encounter (HOSPITAL_COMMUNITY): Payer: Self-pay | Admitting: Psychiatry

## 2018-11-26 ENCOUNTER — Ambulatory Visit (INDEPENDENT_AMBULATORY_CARE_PROVIDER_SITE_OTHER): Payer: Medicare Other | Admitting: Psychiatry

## 2018-11-26 DIAGNOSIS — F3130 Bipolar disorder, current episode depressed, mild or moderate severity, unspecified: Secondary | ICD-10-CM

## 2018-11-26 MED ORDER — PROPRANOLOL HCL 10 MG PO TABS: 10.0000 mg | ORAL_TABLET | Freq: Three times a day (TID) | ORAL | 2 refills | Status: DC

## 2018-11-26 MED ORDER — LATUDA 60 MG PO TABS: 60.0000 mg | ORAL_TABLET | Freq: Every day | ORAL | 2 refills | Status: DC

## 2018-11-26 MED ORDER — VORTIOXETINE HBR 10 MG PO TABS: 10.0000 mg | ORAL_TABLET | Freq: Every day | ORAL | 2 refills | Status: DC

## 2018-11-26 MED ORDER — ZOLPIDEM TARTRATE ER 12.5 MG PO TBCR: 12.5000 mg | EXTENDED_RELEASE_TABLET | Freq: Every evening | ORAL | 2 refills | Status: DC | PRN

## 2018-11-26 MED ORDER — LAMOTRIGINE 200 MG PO TABS: 200.0000 mg | ORAL_TABLET | Freq: Two times a day (BID) | ORAL | 2 refills | Status: DC

## 2018-11-26 MED ORDER — ALPRAZOLAM 2 MG PO TABS: 2.0000 mg | ORAL_TABLET | Freq: Three times a day (TID) | ORAL | 2 refills | Status: DC

## 2018-11-26 NOTE — Progress Notes (Signed)
Virtual Visit via Video Note  I connected with Roger Duran on 11/26/18 at  1:20 PM EDT by a video enabled telemedicine application and verified that I am speaking with the correct person using two identifiers.   I discussed the limitations of evaluation and management by telemedicine and the availability of in person appointments. The patient expressed understanding and agreed to proceed.     I discussed the assessment and treatment plan with the patient. The patient was provided an opportunity to ask questions and all were answered. The patient agreed with the plan and demonstrated an understanding of the instructions.   The patient was advised to call back or seek an in-person evaluation if the symptoms worsen or if the condition fails to improve as anticipated.  I provided 15 minutes of non-face-to-face time during this encounter.   [name], MD  [facility] MD/PA/NP OP Progress Note  11/26/2018 2:02 PM Roger Duran  MRN:  [id number]  Chief Complaint:  Chief Complaint    Depression; Anxiety; Manic Behavior; Follow-up     HPI: This patient is a 48 year old single white male who lives with his grandmother in [place].  He identifies as gay but is not in a relationship right now.  He is on disability for bipolar disorder.  The patient returns after 2 months.  He states that his grandmother has gotten more ill.  She has congestive heart failure and is on oxygen now.  He has to do a lot more to help care for her.  This has been stressful but he feels like it is his obligation to help her.  I suggested looking into getting a home health aide to try to help out.  His mother helps almost every day.  He states that the medications seem to be working well and he is neither depressed nor manic.  He did go on some mild spending sprees with his credit cards but really did not overdo it.  Most the time he is sleeping pretty well.  He denies depression or suicidal ideation. Visit Diagnosis:   ICD-10-CM   1. Bipolar I disorder, most recent episode depressed ([place])  F31.30     Past Psychiatric History: Hospitalization in 2009 after suicide attempt  Past Medical History:  Past Medical History:  Diagnosis Date  . Anxiety   . Bipolar 1 disorder ([place])   . Depression   . Mania ([place])   . Psoriasis     Past Surgical History:  Procedure Laterality Date  . APPENDECTOMY    . COLONOSCOPY N/A 11/14/2014   Procedure: COLONOSCOPY;  Surgeon: Rogene Houston, MD;  [facility];  Service: Endoscopy;  Laterality: N/A;  11:10 - moved to 8:30 - Ann to notify  . TONSILLECTOMY    . WRIST SURGERY      Family Psychiatric History: see below  Family History:  Family History  Problem Relation Age of Onset  . Hyperlipidemia Mother   . Heart disease Father   . Anxiety disorder Father   . Bipolar disorder Maternal Grandfather     Social History:  Social History   Socioeconomic History  . Marital status: Single    Spouse name: Not on file  . Number of children: Not on file  . Years of education: Not on file  . Highest education level: Not on file  Occupational History  . Not on file  Social Needs  . Financial resource strain: Not on file  . Food insecurity    Worry:  Not on file    Inability: Not on file  . Transportation needs    Medical: Not on file    Non-medical: Not on file  Tobacco Use  . Smoking status: Current Every Day Smoker    Packs/day: 0.50    Types: Cigarettes, E-cigarettes  . Smokeless tobacco: Former Systems developer  . Tobacco comment: Per pt he uses E-cigarettes  Substance and Sexual Activity  . Alcohol use: No    Alcohol/week: 0.0 standard drinks    Comment: rarely  . Drug use: No    Comment: 1995-2000 PER PT HE DID A LOT OF COCAINE AND PILLS AND DRANK A LOT  . Sexual activity: Yes    Partners: Male    Birth control/protection: Condom  Lifestyle  . Physical activity    Days per week: Not on file    Minutes per session: Not on file  . Stress: Not on  file  Relationships  . Social Herbalist on phone: Not on file    Gets together: Not on file    Attends religious service: Not on file    Active member of club or organization: Not on file    Attends meetings of clubs or organizations: Not on file    Relationship status: Not on file  Other Topics Concern  . Not on file  Social History Narrative  . Not on file    Allergies:  Allergies  Allergen Reactions  . Penicillins Itching and Rash    Metabolic Disorder Labs: No results found for: HGBA1C, MPG No results found for: PROLACTIN No results found for: CHOL, TRIG, HDL, CHOLHDL, VLDL, LDLCALC No results found for: TSH  Therapeutic Level Labs: Lab Results  Component Value Date   LITHIUM 1.10 10/15/2014   No results found for: VALPROATE No components found for:  CBMZ  Current Medications: Current Outpatient Medications  Medication Sig Dispense Refill  . acyclovir (ZOVIRAX) 400 MG tablet Take 400 mg by mouth 3 (three) times daily.     . Adalimumab (HUMIRA PEN) 40 MG/0.8ML PNKT     . alprazolam (XANAX) 2 MG tablet Take 1 tablet (2 mg total) by mouth 3 (three) times daily. 90 tablet 2  . Cholecalciferol (VITAMIN D3 PO) Take by mouth daily.    . cycloSPORINE (RESTASIS) 0.05 % ophthalmic emulsion Place 1 drop into both eyes 2 (two) times daily.    Marland Kitchen gabapentin (NEURONTIN) 300 MG capsule Take 1 capsule (300 mg total) by mouth 3 (three) times daily. 90 capsule 2  . lamoTRIgine (LAMICTAL) 200 MG tablet Take 1 tablet (200 mg total) by mouth 2 (two) times daily. 60 tablet 2  . loratadine (CLARITIN) 10 MG tablet Take 10 mg by mouth daily.    . Lurasidone HCl (LATUDA) 60 MG TABS Take 1 tablet (60 mg total) by mouth daily. 30 tablet 2  . Omega-3 Fatty Acids (OMEGA 3 PO) Take by mouth 2 (two) times daily.    Marland Kitchen omeprazole (PRILOSEC) 20 MG capsule Take 20 mg by mouth daily.    . propranolol (INDERAL) 10 MG tablet Take 1 tablet (10 mg total) by mouth 3 (three) times daily. 90  tablet 2  . Sennosides (SENOKOT PO) Take by mouth as needed.    Marland Kitchen tenofovir (VIREAD) 300 MG tablet Take 300 mg by mouth daily.    Marland Kitchen tiZANidine (ZANAFLEX) 4 MG tablet 1/2-1 po tid prn spasm    . vitamin C (ASCORBIC ACID) 500 MG tablet Take 1,000 mg by mouth daily.     Marland Kitchen  vortioxetine HBr (TRINTELLIX) 10 MG TABS tablet Take 1 tablet (10 mg total) by mouth daily. 30 tablet 2  . Wheat Dextrin (BENEFIBER DRINK MIX) PACK Take 4 g by mouth at bedtime. (Patient taking differently: Take 4 g by mouth as needed. )    . zolpidem (AMBIEN CR) 12.5 MG CR tablet Take 1 tablet (12.5 mg total) by mouth at bedtime as needed for sleep. 30 tablet 2   No current facility-administered medications for this visit.      Musculoskeletal: Strength & Muscle Tone: within normal limits Gait & Station: normal Patient leans: N/A  Psychiatric Specialty Exam: Review of Systems  Psychiatric/Behavioral: The patient is nervous/anxious.     There were no vitals taken for this visit.There is no height or weight on file to calculate BMI.  General Appearance: Casual and Fairly Groomed  Eye Contact:  Good  Speech:  Clear and Coherent  Volume:  Normal  Mood:  Anxious  Affect:  Appropriate and Congruent  Thought Process:  Goal Directed  Orientation:  Full (Time, Place, and Person)  Thought Content: Rumination   Suicidal Thoughts:  No  Homicidal Thoughts:  No  Memory:  Immediate;   Good Recent;   Good Remote;   Good  Judgement:  Good  Insight:  Fair  Psychomotor Activity:  Normal  Concentration:  Concentration: Good and Attention Span: Good  Recall:  Good  Fund of Knowledge: Good  Language: Good  Akathisia:  No  Handed:  Right  AIMS (if indicated): not done  Assets:  Communication Skills Desire for Improvement Resilience Social Support Talents/Skills  ADL's:  Intact  Cognition: WNL  Sleep:  Good   Screenings: PHQ2-9     Clinical Support from 01/07/2014 in [place]  PHQ-2 Total  Score  0       Assessment and Plan: This patient is a 48 year old male with a history of bipolar disorder.  He seems to be doing fairly well on his current regimen.  He will continue Xanax 2 mg 3 times daily for anxiety, Lamictal 200 mg daily for mood stabilization, Latuda 60 mg daily for mood stabilization, Inderal 10 mg 3 times daily for tremor and Ambien CR 12.5 mg at bedtime for sleep.  He will return to see me in 2 months   [name], MD 11/26/2018, 2:02 PM

## 2019-01-28 ENCOUNTER — Ambulatory Visit (INDEPENDENT_AMBULATORY_CARE_PROVIDER_SITE_OTHER): Payer: Medicare Other | Admitting: Psychiatry

## 2019-01-28 ENCOUNTER — Other Ambulatory Visit: Payer: Self-pay

## 2019-01-28 ENCOUNTER — Encounter (HOSPITAL_COMMUNITY): Payer: Self-pay | Admitting: Psychiatry

## 2019-01-28 DIAGNOSIS — F3130 Bipolar disorder, current episode depressed, mild or moderate severity, unspecified: Secondary | ICD-10-CM

## 2019-01-28 MED ORDER — TEMAZEPAM 15 MG PO CAPS: 15.0000 mg | ORAL_CAPSULE | Freq: Every evening | ORAL | 2 refills | Status: DC | PRN

## 2019-01-28 MED ORDER — ALPRAZOLAM 2 MG PO TABS: 2.0000 mg | ORAL_TABLET | Freq: Three times a day (TID) | ORAL | 2 refills | Status: DC

## 2019-01-28 MED ORDER — GABAPENTIN 300 MG PO CAPS: 300.0000 mg | ORAL_CAPSULE | Freq: Three times a day (TID) | ORAL | 2 refills | Status: DC

## 2019-01-28 MED ORDER — PROPRANOLOL HCL 10 MG PO TABS: 10.0000 mg | ORAL_TABLET | Freq: Three times a day (TID) | ORAL | 2 refills | Status: DC

## 2019-01-28 MED ORDER — VORTIOXETINE HBR 10 MG PO TABS: 10.0000 mg | ORAL_TABLET | Freq: Every day | ORAL | 2 refills | Status: DC

## 2019-01-28 MED ORDER — LAMOTRIGINE 200 MG PO TABS: 200.0000 mg | ORAL_TABLET | Freq: Two times a day (BID) | ORAL | 2 refills | Status: DC

## 2019-01-28 MED ORDER — LATUDA 60 MG PO TABS: 60.0000 mg | ORAL_TABLET | Freq: Every day | ORAL | 2 refills | Status: DC

## 2019-01-28 NOTE — Progress Notes (Signed)
Virtual Visit via Telephone Note  I connected with Roger Duran on 01/28/19 at 11:20 AM EDT by telephone and verified that I am speaking with the correct person using two identifiers.   I discussed the limitations, risks, security and privacy concerns of performing an evaluation and management service by telephone and the availability of in person appointments. I also discussed with the patient that there may be a patient responsible charge related to this service. The patient expressed understanding and agreed to proceed.     I discussed the assessment and treatment plan with the patient. The patient was provided an opportunity to ask questions and all were answered. The patient agreed with the plan and demonstrated an understanding of the instructions.   The patient was advised to call back or seek an in-person evaluation if the symptoms worsen or if the condition fails to improve as anticipated.  I provided 15 minutes of non-face-to-face time during this encounter.   [name], MD  [facility] MD/PA/NP OP Progress Note  01/28/2019 11:45 AM Roger Duran  MRN:  [id number]  Chief Complaint:  Chief Complaint    Depression; Anxiety; Follow-up; Manic Behavior     HPI: This patient is a 48 year old single white male who lives with his grandmother in [place].  He identifies as gay but is not in a relationship right now.  He is on disability for bipolar disorder.  The patient returns for follow-up after 2 months.  He states he has been ill with some sort of viral illness.  He has been having body aches chills and fever although he does not have a thermometer.  He was seen in the emergency room at [facility] on 9/24 and did not have a fever that.  His influenza test and coronavirus tests were negative and his labs were within normal limits although he is slightly anemic.Marland Kitchen  He was sent home and told to take Tylenol and ibuprofen which is what he is been doing.  He states that even before  this happened he was unable to sleep and the Ambien CR is not working anymore.  He wakes up after about 4 hours.  He states mood wise he is generally doing okay although right now he does not feel well and it is hard to say.  He is not able to do the things he used to do because of the protruding disc and he is been getting injections for this.  I suggested we try temazepam for sleep and he agrees.  He is not had significant mood swings or manic episodes Visit Diagnosis:    ICD-10-CM   1. Bipolar I disorder, most recent episode depressed ([place])  F31.30     Past Psychiatric History: Hospitalization in 2009 after suicide attempt  Past Medical History:  Past Medical History:  Diagnosis Date  . Anxiety   . Bipolar 1 disorder ([place])   . Depression   . Mania ([place])   . Psoriasis     Past Surgical History:  Procedure Laterality Date  . APPENDECTOMY    . COLONOSCOPY N/A 11/14/2014   Procedure: COLONOSCOPY;  Surgeon: Rogene Houston, MD;  [facility];  Service: Endoscopy;  Laterality: N/A;  11:10 - moved to 8:30 - Ann to notify  . TONSILLECTOMY    . WRIST SURGERY      Family Psychiatric History: see below  Family History:  Family History  Problem Relation Age of Onset  . Hyperlipidemia Mother   . Heart disease  Father   . Anxiety disorder Father   . Bipolar disorder Maternal Grandfather     Social History:  Social History   Socioeconomic History  . Marital status: Single    Spouse name: Not on file  . Number of children: Not on file  . Years of education: Not on file  . Highest education level: Not on file  Occupational History  . Not on file  Social Needs  . Financial resource strain: Not on file  . Food insecurity    Worry: Not on file    Inability: Not on file  . Transportation needs    Medical: Not on file    Non-medical: Not on file  Tobacco Use  . Smoking status: Current Every Day Smoker    Packs/day: 0.50    Types: Cigarettes, E-cigarettes  .  Smokeless tobacco: Former Systems developer  . Tobacco comment: Per pt he uses E-cigarettes  Substance and Sexual Activity  . Alcohol use: No    Alcohol/week: 0.0 standard drinks    Comment: rarely  . Drug use: No    Comment: 1995-2000 PER PT HE DID A LOT OF COCAINE AND PILLS AND DRANK A LOT  . Sexual activity: Yes    Partners: Male    Birth control/protection: Condom  Lifestyle  . Physical activity    Days per week: Not on file    Minutes per session: Not on file  . Stress: Not on file  Relationships  . Social Herbalist on phone: Not on file    Gets together: Not on file    Attends religious service: Not on file    Active member of club or organization: Not on file    Attends meetings of clubs or organizations: Not on file    Relationship status: Not on file  Other Topics Concern  . Not on file  Social History Narrative  . Not on file    Allergies:  Allergies  Allergen Reactions  . Penicillins Itching and Rash    Metabolic Disorder Labs: No results found for: HGBA1C, MPG No results found for: PROLACTIN No results found for: CHOL, TRIG, HDL, CHOLHDL, VLDL, LDLCALC No results found for: TSH  Therapeutic Level Labs: Lab Results  Component Value Date   LITHIUM 1.10 10/15/2014   No results found for: VALPROATE No components found for:  CBMZ  Current Medications: Current Outpatient Medications  Medication Sig Dispense Refill  . acyclovir (ZOVIRAX) 400 MG tablet Take 400 mg by mouth 3 (three) times daily.     . Adalimumab (HUMIRA PEN) 40 MG/0.8ML PNKT     . alprazolam (XANAX) 2 MG tablet Take 1 tablet (2 mg total) by mouth 3 (three) times daily. 90 tablet 2  . Cholecalciferol (VITAMIN D3 PO) Take by mouth daily.    . cycloSPORINE (RESTASIS) 0.05 % ophthalmic emulsion Place 1 drop into both eyes 2 (two) times daily.    Marland Kitchen gabapentin (NEURONTIN) 300 MG capsule Take 1 capsule (300 mg total) by mouth 3 (three) times daily. 90 capsule 2  . lamoTRIgine (LAMICTAL) 200 MG  tablet Take 1 tablet (200 mg total) by mouth 2 (two) times daily. 60 tablet 2  . loratadine (CLARITIN) 10 MG tablet Take 10 mg by mouth daily.    . Lurasidone HCl (LATUDA) 60 MG TABS Take 1 tablet (60 mg total) by mouth daily. 30 tablet 2  . Omega-3 Fatty Acids (OMEGA 3 PO) Take by mouth 2 (two) times daily.    Marland Kitchen  omeprazole (PRILOSEC) 20 MG capsule Take 20 mg by mouth daily.    . propranolol (INDERAL) 10 MG tablet Take 1 tablet (10 mg total) by mouth 3 (three) times daily. 90 tablet 2  . Sennosides (SENOKOT PO) Take by mouth as needed.    . temazepam (RESTORIL) 15 MG capsule Take 1 capsule (15 mg total) by mouth at bedtime as needed for sleep. 30 capsule 2  . tenofovir (VIREAD) 300 MG tablet Take 300 mg by mouth daily.    Marland Kitchen tiZANidine (ZANAFLEX) 4 MG tablet 1/2-1 po tid prn spasm    . vitamin C (ASCORBIC ACID) 500 MG tablet Take 1,000 mg by mouth daily.     Marland Kitchen vortioxetine HBr (TRINTELLIX) 10 MG TABS tablet Take 1 tablet (10 mg total) by mouth daily. 30 tablet 2  . Wheat Dextrin (BENEFIBER DRINK MIX) PACK Take 4 g by mouth at bedtime. (Patient taking differently: Take 4 g by mouth as needed. )     No current facility-administered medications for this visit.      Musculoskeletal: Strength & Muscle Tone: within normal limits Gait & Station: normal Patient leans: N/A  Psychiatric Specialty Exam: Review of Systems  Constitutional: Positive for chills and malaise/fatigue.  Musculoskeletal: Positive for joint pain and myalgias.  Psychiatric/Behavioral: The patient has insomnia.   All other systems reviewed and are negative.   There were no vitals taken for this visit.There is no height or weight on file to calculate BMI.  General Appearance: NA  Eye Contact:  NA  Speech:  Clear and Coherent  Volume:  Normal  Mood:  Euthymic  Affect:  NA  Thought Process:  Goal Directed  Orientation:  Full (Time, Place, and Person)  Thought Content: Rumination   Suicidal Thoughts:  No  Homicidal  Thoughts:  No  Memory:  Immediate;   Good Recent;   Good Remote;   Good  Judgement:  Good  Insight:  Good  Psychomotor Activity:  Decreased  Concentration:  Concentration: Good and Attention Span: Good  Recall:  Good  Fund of Knowledge: Good  Language: Good  Akathisia:  No  Handed:  Right  AIMS (if indicated): not done  Assets:  Communication Skills Desire for Improvement Resilience Social Support Talents/Skills  ADL's:  Intact  Cognition: WNL  Sleep:  Poor   Screenings: PHQ2-9     Clinical Support from 01/07/2014 in [place]  PHQ-2 Total Score  0       Assessment and Plan: This patient is a 48 year old male with a history of bipolar disorder.  He is not sleeping well so we will discontinue Ambien CR and start temazepam 15 mg at bedtime.  He will continue Xanax 2 mg 3 times daily for anxiety, Lamictal 200 mg daily for mood stabilization, Latuda 60 mg daily for mood stabilization, Inderal 10 mg 3 times daily for tremor, Trintellix 10 mg daily for depression and gabapentin 300 mg 3 times daily for anxiety.  He will return to see me in 6 weeks or call sooner if needed   [name], MD 01/28/2019, 11:45 AM

## 2019-02-22 ENCOUNTER — Other Ambulatory Visit (HOSPITAL_COMMUNITY): Payer: Self-pay | Admitting: Psychiatry

## 2019-02-22 ENCOUNTER — Telehealth (HOSPITAL_COMMUNITY): Payer: Self-pay | Admitting: *Deleted

## 2019-02-22 MED ORDER — TRAZODONE HCL 100 MG PO TABS: ORAL_TABLET | ORAL | 2 refills | Status: DC

## 2019-02-22 NOTE — Telephone Encounter (Signed)
Patient called  & front office scheduled him a earlier appt on  11/3. Patient had  Appt with Dr Raynald Kemp says she  Seems to think & said that he's trending towards hypermania.  He said he's not sleeping well , he's just napping & wakes up & can't go back to sleep, mind racing, toss  & turning . Started back on the Ambien taking along with melatonin (and that's not working either) Temazepam didn't seem to be working so he tried that to see if it would help. Patient called just requesting a call back doesn't know how much longer he will be able to continue like this without sleep. And doesn't fill like he can wait until appt on  11/3. # [phone]

## 2019-02-22 NOTE — Telephone Encounter (Signed)
Spoke to pt, will try Trazodone 100-200 ng at bedtime

## 2019-03-05 ENCOUNTER — Other Ambulatory Visit: Payer: Self-pay

## 2019-03-05 ENCOUNTER — Encounter (HOSPITAL_COMMUNITY): Payer: Self-pay | Admitting: Psychiatry

## 2019-03-05 ENCOUNTER — Ambulatory Visit (INDEPENDENT_AMBULATORY_CARE_PROVIDER_SITE_OTHER): Payer: Medicare Other | Admitting: Psychiatry

## 2019-03-05 DIAGNOSIS — F3130 Bipolar disorder, current episode depressed, mild or moderate severity, unspecified: Secondary | ICD-10-CM | POA: Diagnosis not present

## 2019-03-05 MED ORDER — LATUDA 60 MG PO TABS: 60.0000 mg | ORAL_TABLET | Freq: Every day | ORAL | 2 refills | Status: DC

## 2019-03-05 MED ORDER — TRAZODONE HCL 100 MG PO TABS: ORAL_TABLET | ORAL | 2 refills | Status: DC

## 2019-03-05 MED ORDER — PROPRANOLOL HCL 10 MG PO TABS: 10.0000 mg | ORAL_TABLET | Freq: Three times a day (TID) | ORAL | 2 refills | Status: DC

## 2019-03-05 MED ORDER — ALPRAZOLAM 2 MG PO TABS: 2.0000 mg | ORAL_TABLET | Freq: Three times a day (TID) | ORAL | 2 refills | Status: DC

## 2019-03-05 MED ORDER — LAMOTRIGINE 200 MG PO TABS: 200.0000 mg | ORAL_TABLET | Freq: Two times a day (BID) | ORAL | 2 refills | Status: DC

## 2019-03-05 MED ORDER — GABAPENTIN 300 MG PO CAPS: 300.0000 mg | ORAL_CAPSULE | Freq: Three times a day (TID) | ORAL | 2 refills | Status: DC

## 2019-03-05 MED ORDER — VORTIOXETINE HBR 10 MG PO TABS: 10.0000 mg | ORAL_TABLET | Freq: Every day | ORAL | 2 refills | Status: DC

## 2019-03-05 NOTE — Progress Notes (Signed)
Virtual Visit via Telephone Note  I connected with Roger Duran on 03/05/19 at  8:40 AM EST by telephone and verified that I am speaking with the correct person using two identifiers.   I discussed the limitations, risks, security and privacy concerns of performing an evaluation and management service by telephone and the availability of in person appointments. I also discussed with the patient that there may be a patient responsible charge related to this service. The patient expressed understanding and agreed to proceed.    I discussed the assessment and treatment plan with the patient. The patient was provided an opportunity to ask questions and all were answered. The patient agreed with the plan and demonstrated an understanding of the instructions.   The patient was advised to call back or seek an in-person evaluation if the symptoms worsen or if the condition fails to improve as anticipated.  I provided 15 minutes of non-face-to-face time during this encounter.   [name], MD  [facility] MD/PA/NP OP Progress Note  03/05/2019 8:57 AM Roger Duran  MRN:  [id number]  Chief Complaint:  Chief Complaint    Depression; Anxiety; Manic Behavior; Follow-up     HPI: This patient is a 48 year old single white male who lives with his grandmother in [place].  He identifies as gay but is not in a relationship right now.  He is on disability for bipolar disorder.  The patient returns for follow-up after 6 weeks.  However on 02/22/2019 he called stating that he was unable to sleep.  We changed his temazepam to trazodone and he is sleeping much better now.  He tried 200 mg which caused body aches but at the 100 mg dosage he is sleeping 7 to 8 hours a night and feeling much more rested and less stressed.  He states that generally his mood is good.  He gets somewhat frustrated taking care of his 52 year old grandmother at times but most of the time he enjoys it.  His energy is getting better but he  still suffers from chronic back pain.  He seems to be in a much more positive frame of mind.  He denies any manic symptoms Visit Diagnosis:    ICD-10-CM   1. Bipolar I disorder, most recent episode depressed ([place])  F31.30     Past Psychiatric History: Hospitalization in 2009 after suicide attempt  Past Medical History:  Past Medical History:  Diagnosis Date  . Anxiety   . Bipolar 1 disorder ([place])   . Depression   . Mania ([place])   . Psoriasis     Past Surgical History:  Procedure Laterality Date  . APPENDECTOMY    . COLONOSCOPY N/A 11/14/2014   Procedure: COLONOSCOPY;  Surgeon: Rogene Houston, MD;  [facility];  Service: Endoscopy;  Laterality: N/A;  11:10 - moved to 8:30 - Ann to notify  . TONSILLECTOMY    . WRIST SURGERY      Family Psychiatric History: see below  Family History:  Family History  Problem Relation Age of Onset  . Hyperlipidemia Mother   . Heart disease Father   . Anxiety disorder Father   . Bipolar disorder Maternal Grandfather     Social History:  Social History   Socioeconomic History  . Marital status: Single    Spouse name: Not on file  . Number of children: Not on file  . Years of education: Not on file  . Highest education level: Not on file  Occupational History  .  Not on file  Social Needs  . Financial resource strain: Not on file  . Food insecurity    Worry: Not on file    Inability: Not on file  . Transportation needs    Medical: Not on file    Non-medical: Not on file  Tobacco Use  . Smoking status: Current Every Day Smoker    Packs/day: 0.50    Types: Cigarettes, E-cigarettes  . Smokeless tobacco: Former Systems developer  . Tobacco comment: Per pt he uses E-cigarettes  Substance and Sexual Activity  . Alcohol use: No    Alcohol/week: 0.0 standard drinks    Comment: rarely  . Drug use: No    Comment: 1995-2000 PER PT HE DID A LOT OF COCAINE AND PILLS AND DRANK A LOT  . Sexual activity: Yes    Partners: Male    Birth  control/protection: Condom  Lifestyle  . Physical activity    Days per week: Not on file    Minutes per session: Not on file  . Stress: Not on file  Relationships  . Social Herbalist on phone: Not on file    Gets together: Not on file    Attends religious service: Not on file    Active member of club or organization: Not on file    Attends meetings of clubs or organizations: Not on file    Relationship status: Not on file  Other Topics Concern  . Not on file  Social History Narrative  . Not on file    Allergies:  Allergies  Allergen Reactions  . Penicillins Itching and Rash    Metabolic Disorder Labs: No results found for: HGBA1C, MPG No results found for: PROLACTIN No results found for: CHOL, TRIG, HDL, CHOLHDL, VLDL, LDLCALC No results found for: TSH  Therapeutic Level Labs: Lab Results  Component Value Date   LITHIUM 1.10 10/15/2014   No results found for: VALPROATE No components found for:  CBMZ  Current Medications: Current Outpatient Medications  Medication Sig Dispense Refill  . acyclovir (ZOVIRAX) 400 MG tablet Take 400 mg by mouth 3 (three) times daily.     . Adalimumab (HUMIRA PEN) 40 MG/0.8ML PNKT     . alprazolam (XANAX) 2 MG tablet Take 1 tablet (2 mg total) by mouth 3 (three) times daily. 90 tablet 2  . Cholecalciferol (VITAMIN D3 PO) Take by mouth daily.    . cycloSPORINE (RESTASIS) 0.05 % ophthalmic emulsion Place 1 drop into both eyes 2 (two) times daily.    Marland Kitchen gabapentin (NEURONTIN) 300 MG capsule Take 1 capsule (300 mg total) by mouth 3 (three) times daily. 90 capsule 2  . lamoTRIgine (LAMICTAL) 200 MG tablet Take 1 tablet (200 mg total) by mouth 2 (two) times daily. 60 tablet 2  . loratadine (CLARITIN) 10 MG tablet Take 10 mg by mouth daily.    . Lurasidone HCl (LATUDA) 60 MG TABS Take 1 tablet (60 mg total) by mouth daily. 30 tablet 2  . Omega-3 Fatty Acids (OMEGA 3 PO) Take by mouth 2 (two) times daily.    Marland Kitchen omeprazole (PRILOSEC) 20  MG capsule Take 20 mg by mouth daily.    . propranolol (INDERAL) 10 MG tablet Take 1 tablet (10 mg total) by mouth 3 (three) times daily. 90 tablet 2  . Sennosides (SENOKOT PO) Take by mouth as needed.    Marland Kitchen tenofovir (VIREAD) 300 MG tablet Take 300 mg by mouth daily.    Marland Kitchen tiZANidine (ZANAFLEX) 4 MG tablet  1/2-1 po tid prn spasm    . traZODone (DESYREL) 100 MG tablet Take one or two at bedtime 60 tablet 2  . vitamin C (ASCORBIC ACID) 500 MG tablet Take 1,000 mg by mouth daily.     Marland Kitchen vortioxetine HBr (TRINTELLIX) 10 MG TABS tablet Take 1 tablet (10 mg total) by mouth daily. 30 tablet 2  . Wheat Dextrin (BENEFIBER DRINK MIX) PACK Take 4 g by mouth at bedtime. (Patient taking differently: Take 4 g by mouth as needed. )     No current facility-administered medications for this visit.      Musculoskeletal: Strength & Muscle Tone: within normal limits Gait & Station: normal Patient leans: N/A  Psychiatric Specialty Exam: Review of Systems  Musculoskeletal: Positive for back pain.  All other systems reviewed and are negative.   There were no vitals taken for this visit.There is no height or weight on file to calculate BMI.  General Appearance: NA  Eye Contact:  NA  Speech:  Clear and Coherent  Volume:  Normal  Mood:  Euthymic  Affect:  NA  Thought Process:  Goal Directed  Orientation:  Full (Time, Place, and Person)  Thought Content: WDL   Suicidal Thoughts:  No  Homicidal Thoughts:  No  Memory:  Immediate;   Good Recent;   Good Remote;   Good  Judgement:  Good  Insight:  Good  Psychomotor Activity:  Normal  Concentration:  Concentration: Good and Attention Span: Good  Recall:  Good  Fund of Knowledge: Good  Language: Good  Akathisia:  No  Handed:  Right  AIMS (if indicated): not done  Assets:  Communication Skills Desire for Improvement Resilience Social Support Talents/Skills  ADL's:  Intact  Cognition: WNL  Sleep:  Good   Screenings: PHQ2-9     Clinical Support  from 01/07/2014 in [place]  PHQ-2 Total Score  0       Assessment and Plan: This patient is a 48 year old male with a history of bipolar disorder.  He is sleeping much better on trazodone 100 mg at bedtime so this will be continued.  He will continue Xanax 2 mg 3 times daily for anxiety, Lamictal 200 mg daily for mood stabilization, Latuda 60 mg daily for mood stabilization, Inderal 10 mg 3 times daily for tremor, Trintellix 10 mg daily for depression and gabapentin 300 mg 3 times daily for anxiety and neuropathic pain.  He will return to see me in 2 months   [name], MD 03/05/2019, 8:57 AM

## 2019-03-12 ENCOUNTER — Ambulatory Visit (HOSPITAL_COMMUNITY): Payer: Medicare Other | Admitting: Psychiatry

## 2019-05-06 ENCOUNTER — Encounter (HOSPITAL_COMMUNITY): Payer: Self-pay | Admitting: Psychiatry

## 2019-05-06 ENCOUNTER — Ambulatory Visit (INDEPENDENT_AMBULATORY_CARE_PROVIDER_SITE_OTHER): Payer: Medicare Other | Admitting: Psychiatry

## 2019-05-06 ENCOUNTER — Other Ambulatory Visit: Payer: Self-pay

## 2019-05-06 DIAGNOSIS — F3130 Bipolar disorder, current episode depressed, mild or moderate severity, unspecified: Secondary | ICD-10-CM

## 2019-05-06 MED ORDER — VORTIOXETINE HBR 10 MG PO TABS: 10.0000 mg | ORAL_TABLET | Freq: Every day | ORAL | 2 refills | Status: DC

## 2019-05-06 MED ORDER — TRAZODONE HCL 100 MG PO TABS: ORAL_TABLET | ORAL | 2 refills | Status: DC

## 2019-05-06 MED ORDER — LATUDA 60 MG PO TABS: 60.0000 mg | ORAL_TABLET | Freq: Every day | ORAL | 2 refills | Status: DC

## 2019-05-06 MED ORDER — ALPRAZOLAM 2 MG PO TABS: 2.0000 mg | ORAL_TABLET | Freq: Three times a day (TID) | ORAL | 2 refills | Status: DC

## 2019-05-06 MED ORDER — PROPRANOLOL HCL ER 60 MG PO CP24: 60.0000 mg | ORAL_CAPSULE | Freq: Every day | ORAL | 2 refills | Status: DC

## 2019-05-06 MED ORDER — GABAPENTIN 300 MG PO CAPS: 300.0000 mg | ORAL_CAPSULE | Freq: Three times a day (TID) | ORAL | 2 refills | Status: DC

## 2019-05-06 MED ORDER — LAMOTRIGINE 200 MG PO TABS: 200.0000 mg | ORAL_TABLET | Freq: Two times a day (BID) | ORAL | 2 refills | Status: DC

## 2019-05-06 NOTE — Progress Notes (Signed)
Virtual Visit via Telephone Note  I connected with Roger Duran on 05/06/19 at  3:00 PM EST by telephone and verified that I am speaking with the correct person using two identifiers.   I discussed the limitations, risks, security and privacy concerns of performing an evaluation and management service by telephone and the availability of in person appointments. I also discussed with the patient that there may be a patient responsible charge related to this service. The patient expressed understanding and agreed to proceed.    I discussed the assessment and treatment plan with the patient. The patient was provided an opportunity to ask questions and all were answered. The patient agreed with the plan and demonstrated an understanding of the instructions.   The patient was advised to call back or seek an in-person evaluation if the symptoms worsen or if the condition fails to improve as anticipated.  I provided 15 minutes of non-face-to-face time during this encounter.   [name], MD  [facility] MD/PA/NP OP Progress Note  05/06/2019 3:21 PM Roger Duran  MRN:  [id number]  Chief Complaint:  Chief Complaint    Depression; Anxiety; Follow-up; Manic Behavior     HPI: This patient is a 49 year old single white male who lives with his grandmother in [place].  He identifies as gay but is not in a relationship right now.  He is on disability for bipolar disorder.  The patient returns for follow-up after 2 months  The patient states that he has had a lot of back problems.  He has received several injections but they do not always last.  He feels limited by this and has not been able to do as much around the house and his mother is helping out.  Physical therapy really did not help either.  He might have to have a more extensive procedure next time.  He states overall however his mood is good and he sleeping well with the trazodone.  He does note that he still has a pretty bad tremor in his hands  and it is difficult for him to hold onto a cup of coffee or put sweetener in his drink.  We have tried propranolol 10 mg 3 times daily but it may be time to go to a higher dosage.  We both assume this is from the [place] but the [place] has helped tremendously with his mood swings and he does not want to go off of it.  He is off the Wellbutrin and the tremor has not really changed that much.  Overall however his mood is good and he is sleeping well denies severe anxiety or panic attacks. Visit Diagnosis:    ICD-10-CM   1. Bipolar I disorder, most recent episode depressed ([place])  F31.30     Past Psychiatric History: Hospitalization in 2009 after suicide attempt  Past Medical History:  Past Medical History:  Diagnosis Date  . Anxiety   . Bipolar 1 disorder ([place])   . Depression   . Mania ([place])   . Psoriasis     Past Surgical History:  Procedure Laterality Date  . APPENDECTOMY    . COLONOSCOPY N/A 11/14/2014   Procedure: COLONOSCOPY;  Surgeon: Rogene Houston, MD;  [facility];  Service: Endoscopy;  Laterality: N/A;  11:10 - moved to 8:30 - Ann to notify  . TONSILLECTOMY    . WRIST SURGERY      Family Psychiatric History: see below  Family History:  Family History  Problem Relation Age  of Onset  . Hyperlipidemia Mother   . Heart disease Father   . Anxiety disorder Father   . Bipolar disorder Maternal Grandfather     Social History:  Social History   Socioeconomic History  . Marital status: Single    Spouse name: Not on file  . Number of children: Not on file  . Years of education: Not on file  . Highest education level: Not on file  Occupational History  . Not on file  Tobacco Use  . Smoking status: Current Every Day Smoker    Packs/day: 0.50    Types: Cigarettes, E-cigarettes  . Smokeless tobacco: Former Systems developer  . Tobacco comment: Per pt he uses E-cigarettes  Substance and Sexual Activity  . Alcohol use: No    Alcohol/week: 0.0 standard drinks    Comment:  rarely  . Drug use: No    Comment: 1995-2000 PER PT HE DID A LOT OF COCAINE AND PILLS AND DRANK A LOT  . Sexual activity: Yes    Partners: Male    Birth control/protection: Condom  Other Topics Concern  . Not on file  Social History Narrative  . Not on file   Social Determinants of Health   Financial Resource Strain:   . Difficulty of Paying Living Expenses: Not on file  Food Insecurity:   . Worried About Charity fundraiser in the Last Year: Not on file  . Ran Out of Food in the Last Year: Not on file  Transportation Needs:   . Lack of Transportation (Medical): Not on file  . Lack of Transportation (Non-Medical): Not on file  Physical Activity:   . Days of Exercise per Week: Not on file  . Minutes of Exercise per Session: Not on file  Stress:   . Feeling of Stress : Not on file  Social Connections:   . Frequency of Communication with Friends and Family: Not on file  . Frequency of Social Gatherings with Friends and Family: Not on file  . Attends Religious Services: Not on file  . Active Member of Clubs or Organizations: Not on file  . Attends Archivist Meetings: Not on file  . Marital Status: Not on file    Allergies:  Allergies  Allergen Reactions  . Penicillins Itching and Rash    Metabolic Disorder Labs: No results found for: HGBA1C, MPG No results found for: PROLACTIN No results found for: CHOL, TRIG, HDL, CHOLHDL, VLDL, LDLCALC No results found for: TSH  Therapeutic Level Labs: Lab Results  Component Value Date   LITHIUM 1.10 10/15/2014   No results found for: VALPROATE No components found for:  CBMZ  Current Medications: Current Outpatient Medications  Medication Sig Dispense Refill  . acyclovir (ZOVIRAX) 400 MG tablet Take 400 mg by mouth 3 (three) times daily.     . Adalimumab (HUMIRA PEN) 40 MG/0.8ML PNKT     . alprazolam (XANAX) 2 MG tablet Take 1 tablet (2 mg total) by mouth 3 (three) times daily. 90 tablet 2  . baclofen (LIORESAL)  10 MG tablet Take 10 mg by mouth 2 (two) times daily as needed.    . cycloSPORINE (RESTASIS) 0.05 % ophthalmic emulsion Place 1 drop into both eyes 2 (two) times daily.    Marland Kitchen gabapentin (NEURONTIN) 300 MG capsule Take 1 capsule (300 mg total) by mouth 3 (three) times daily. 90 capsule 2  . lamoTRIgine (LAMICTAL) 200 MG tablet Take 1 tablet (200 mg total) by mouth 2 (two) times daily. 60 tablet  2  . loratadine (CLARITIN) 10 MG tablet Take 10 mg by mouth daily.    . Lurasidone HCl (LATUDA) 60 MG TABS Take 1 tablet (60 mg total) by mouth daily. 30 tablet 2  . omeprazole (PRILOSEC) 20 MG capsule Take 20 mg by mouth daily.    . propranolol ER (INDERAL LA) 60 MG 24 hr capsule Take 1 capsule (60 mg total) by mouth daily. 30 capsule 2  . Sennosides (SENOKOT PO) Take by mouth as needed.    Marland Kitchen tenofovir (VIREAD) 300 MG tablet Take 300 mg by mouth daily.    . traZODone (DESYREL) 100 MG tablet Take one or two at bedtime 60 tablet 2  . vortioxetine HBr (TRINTELLIX) 10 MG TABS tablet Take 1 tablet (10 mg total) by mouth daily. 30 tablet 2  . Wheat Dextrin (BENEFIBER DRINK MIX) PACK Take 4 g by mouth at bedtime. (Patient taking differently: Take 4 g by mouth as needed. )     No current facility-administered medications for this visit.     Musculoskeletal: Strength & Muscle Tone: within normal limits Gait & Station: normal Patient leans: N/A  Psychiatric Specialty Exam: Review of Systems  Musculoskeletal: Positive for back pain.  Neurological: Positive for tremors.  All other systems reviewed and are negative.   There were no vitals taken for this visit.There is no height or weight on file to calculate BMI.  General Appearance: NA  Eye Contact:  NA  Speech:  Clear and Coherent  Volume:  Normal  Mood:  Euthymic  Affect:  NA  Thought Process:  Goal Directed  Orientation:  Full (Time, Place, and Person)  Thought Content: WDL   Suicidal Thoughts:  No  Homicidal Thoughts:  No  Memory:  Immediate;    Good Recent;   Good Remote;   Fair  Judgement:  Good  Insight:  Good  Psychomotor Activity:  Decreased and hand tremors  Concentration:  Concentration: Good and Attention Span: Good  Recall:  Good  Fund of Knowledge: Good  Language: Good  Akathisia:  No  Handed:  Right  AIMS (if indicated): not done  Assets:  Communication Skills Desire for Improvement Physical Health Resilience Social Support Talents/Skills  ADL's:  Intact  Cognition: WNL  Sleep:  Good   Screenings: PHQ2-9     Clinical Support from 01/07/2014 in [place]  PHQ-2 Total Score  0       Assessment and Plan: This patient is a 49 year old male with a history of bipolar disorder.  His mood and sleep are good so we will continue trazodone 100 mg at bedtime for sleep, Xanax 2 mg 3 times daily for anxiety, Lamictal 200 mg daily for mood stabilization Latuda 60 mg daily for mood stabilization, Trintellix 10 mg daily for depression and gabapentin 300 mg 3 times daily for anxiety and neuropathic pain.  Since he is having hand tremors we will discontinue Inderal 10 mg 3 times daily in favor of Inderal LA 60 mg every morning.  He will return to see me in 2 months   [name], MD 05/06/2019, 3:21 PM

## 2019-07-04 ENCOUNTER — Encounter (HOSPITAL_COMMUNITY): Payer: Self-pay | Admitting: Psychiatry

## 2019-07-04 ENCOUNTER — Ambulatory Visit (INDEPENDENT_AMBULATORY_CARE_PROVIDER_SITE_OTHER): Payer: Medicare Other | Admitting: Psychiatry

## 2019-07-04 ENCOUNTER — Other Ambulatory Visit: Payer: Self-pay

## 2019-07-04 DIAGNOSIS — F3130 Bipolar disorder, current episode depressed, mild or moderate severity, unspecified: Secondary | ICD-10-CM | POA: Diagnosis not present

## 2019-07-04 MED ORDER — ALPRAZOLAM 2 MG PO TABS: 2.0000 mg | ORAL_TABLET | Freq: Three times a day (TID) | ORAL | 2 refills | Status: DC

## 2019-07-04 MED ORDER — LATUDA 60 MG PO TABS: 60.0000 mg | ORAL_TABLET | Freq: Every day | ORAL | 2 refills | Status: DC

## 2019-07-04 MED ORDER — PROPRANOLOL HCL ER 80 MG PO CP24: 80.0000 mg | ORAL_CAPSULE | Freq: Every day | ORAL | 11 refills | Status: DC

## 2019-07-04 MED ORDER — VORTIOXETINE HBR 20 MG PO TABS: 20.0000 mg | ORAL_TABLET | Freq: Every day | ORAL | 2 refills | Status: DC

## 2019-07-04 MED ORDER — TRAZODONE HCL 100 MG PO TABS: ORAL_TABLET | ORAL | 2 refills | Status: DC

## 2019-07-04 MED ORDER — GABAPENTIN 300 MG PO CAPS: 300.0000 mg | ORAL_CAPSULE | Freq: Three times a day (TID) | ORAL | 2 refills | Status: DC

## 2019-07-04 MED ORDER — LAMOTRIGINE 200 MG PO TABS: 200.0000 mg | ORAL_TABLET | Freq: Two times a day (BID) | ORAL | 2 refills | Status: DC

## 2019-07-04 NOTE — Progress Notes (Signed)
Virtual Visit via Telephone Note  I connected with [name] on 07/04/19 at 10:40 AM EST by telephone and verified that I am speaking with the correct person using two identifiers.   I discussed the limitations, risks, security and privacy concerns of performing an evaluation and management service by telephone and the availability of in person appointments. I also discussed with the patient that there may be a patient responsible charge related to this service. The patient expressed understanding and agreed to proceed.   I discussed the assessment and treatment plan with the patient. The patient was provided an opportunity to ask questions and all were answered. The patient agreed with the plan and demonstrated an understanding of the instructions.   The patient was advised to call back or seek an in-person evaluation if the symptoms worsen or if the condition fails to improve as anticipated.  I provided 15 minutes of non-face-to-face time during this encounter.   [name], MD  [facility] MD/PA/NP OP Progress Note  07/04/2019 10:56 AM [name]  MRN:  [id number]  Chief Complaint:  Chief Complaint    Depression; Anxiety; Manic Behavior; Follow-up     HPI: This patient is a 49 year old single white male who lives with his grandmother in [place].  He identifies as gay but is not in a relationship right now.  He is on disability for bipolar disorder.  The patient returns for follow-up after 2 months.  The patient states that his back problems are better.  He had a rhizotomy procedure on 05/28/2019 and it seems to have helped.  His mobility has improved and he is going to start walking.  He thinks due to lack of exercise he has gained about 8 pounds.  He states that the Trintellix helps with his depression for about 4 hours and then it seems to "wear off" and then he gets depressed again although not seriously bad or suicidal.  I suggested that we increase the dosage from 10 to 20 mg.   He states that the tremor in his hands are worse and sometimes it is involving the entire arm.  I think this is still from the lurasidone but he does not want to go off it because it is really helped his mood swings.  I suggested that we go up on his propranolol but also that he get a referral from primary care for neurology evaluation. Visit Diagnosis:    ICD-10-CM   1. Bipolar I disorder, most recent episode depressed ([place])  F31.30     Past Psychiatric History: Hospitalization in 2009 after suicide attempt  Past Medical History:  Past Medical History:  Diagnosis Date  . Anxiety   . Bipolar 1 disorder ([place])   . Depression   . Mania ([place])   . Psoriasis     Past Surgical History:  Procedure Laterality Date  . APPENDECTOMY    . COLONOSCOPY N/A 11/14/2014   Procedure: COLONOSCOPY;  Surgeon: Rogene Houston, MD;  [facility];  Service: Endoscopy;  Laterality: N/A;  11:10 - moved to 8:30 - Ann to notify  . TONSILLECTOMY    . WRIST SURGERY      Family Psychiatric History: see below  Family History:  Family History  Problem Relation Age of Onset  . Hyperlipidemia Mother   . Heart disease Father   . Anxiety disorder Father   . Bipolar disorder Maternal Grandfather     Social History:  Social History   Socioeconomic History  .  Marital status: Single    Spouse name: Not on file  . Number of children: Not on file  . Years of education: Not on file  . Highest education level: Not on file  Occupational History  . Not on file  Tobacco Use  . Smoking status: Current Every Day Smoker    Packs/day: 0.50    Types: Cigarettes, E-cigarettes  . Smokeless tobacco: Former Systems developer  . Tobacco comment: Per pt he uses E-cigarettes  Substance and Sexual Activity  . Alcohol use: No    Alcohol/week: 0.0 standard drinks    Comment: rarely  . Drug use: No    Comment: 1995-2000 PER PT HE DID A LOT OF COCAINE AND PILLS AND DRANK A LOT  . Sexual activity: Yes    Partners: Male     Birth control/protection: Condom  Other Topics Concern  . Not on file  Social History Narrative  . Not on file   Social Determinants of Health   Financial Resource Strain:   . Difficulty of Paying Living Expenses: Not on file  Food Insecurity:   . Worried About Charity fundraiser in the Last Year: Not on file  . Ran Out of Food in the Last Year: Not on file  Transportation Needs:   . Lack of Transportation (Medical): Not on file  . Lack of Transportation (Non-Medical): Not on file  Physical Activity:   . Days of Exercise per Week: Not on file  . Minutes of Exercise per Session: Not on file  Stress:   . Feeling of Stress : Not on file  Social Connections:   . Frequency of Communication with Friends and Family: Not on file  . Frequency of Social Gatherings with Friends and Family: Not on file  . Attends Religious Services: Not on file  . Active Member of Clubs or Organizations: Not on file  . Attends Archivist Meetings: Not on file  . Marital Status: Not on file    Allergies:  Allergies  Allergen Reactions  . Penicillins Itching and Rash    Metabolic Disorder Labs: No results found for: HGBA1C, MPG No results found for: PROLACTIN No results found for: CHOL, TRIG, HDL, CHOLHDL, VLDL, LDLCALC No results found for: TSH  Therapeutic Level Labs: Lab Results  Component Value Date   LITHIUM 1.10 10/15/2014   No results found for: VALPROATE No components found for:  CBMZ  Current Medications: Current Outpatient Medications  Medication Sig Dispense Refill  . acyclovir (ZOVIRAX) 400 MG tablet Take 400 mg by mouth 3 (three) times daily.     . Adalimumab (HUMIRA PEN) 40 MG/0.8ML PNKT     . alprazolam (XANAX) 2 MG tablet Take 1 tablet (2 mg total) by mouth 3 (three) times daily. 90 tablet 2  . baclofen (LIORESAL) 10 MG tablet Take 10 mg by mouth 2 (two) times daily as needed.    . cycloSPORINE (RESTASIS) 0.05 % ophthalmic emulsion Place 1 drop into both eyes 2  (two) times daily.    Marland Kitchen gabapentin (NEURONTIN) 300 MG capsule Take 1 capsule (300 mg total) by mouth 3 (three) times daily. 90 capsule 2  . lamoTRIgine (LAMICTAL) 200 MG tablet Take 1 tablet (200 mg total) by mouth 2 (two) times daily. 60 tablet 2  . loratadine (CLARITIN) 10 MG tablet Take 10 mg by mouth daily.    . Lurasidone HCl (LATUDA) 60 MG TABS Take 1 tablet (60 mg total) by mouth daily. 30 tablet 2  . omeprazole (PRILOSEC)  20 MG capsule Take 20 mg by mouth daily.    . propranolol ER (INDERAL LA) 80 MG 24 hr capsule Take 1 capsule (80 mg total) by mouth daily. 30 capsule 11  . Sennosides (SENOKOT PO) Take by mouth as needed.    Marland Kitchen tenofovir (VIREAD) 300 MG tablet Take 300 mg by mouth daily.    . traZODone (DESYREL) 100 MG tablet Take one or two at bedtime 60 tablet 2  . vortioxetine HBr (TRINTELLIX) 20 MG TABS tablet Take 1 tablet (20 mg total) by mouth daily. 30 tablet 2  . Wheat Dextrin (BENEFIBER DRINK MIX) PACK Take 4 g by mouth at bedtime. (Patient taking differently: Take 4 g by mouth as needed. )     No current facility-administered medications for this visit.     Musculoskeletal: Strength & Muscle Tone: within normal limits Gait & Station: normal Patient leans: N/A  Psychiatric Specialty Exam: Review of Systems  Musculoskeletal: Positive for back pain.  Neurological: Positive for tremors.  Psychiatric/Behavioral: Positive for dysphoric mood.  All other systems reviewed and are negative.   There were no vitals taken for this visit.There is no height or weight on file to calculate BMI.  General Appearance: NA  Eye Contact:  NA  Speech:  Clear and Coherent  Volume:  Normal  Mood:  Dysphoric  Affect:  NA  Thought Process:  Goal Directed  Orientation:  Full (Time, Place, and Person)  Thought Content: WDL   Suicidal Thoughts:  No  Homicidal Thoughts:  No  Memory:  Immediate;   Good Recent;   Good Remote;   Good  Judgement:  Good  Insight:  Good  Psychomotor  Activity:  Normal  Concentration:  Concentration: Good and Attention Span: Good  Recall:  Good  Fund of Knowledge: Good  Language: Good  Akathisia:  No  Handed:  Right  AIMS (if indicated): not done  Assets:  Communication Skills Desire for Improvement Resilience Social Support Talents/Skills  ADL's:  Intact  Cognition: WNL  Sleep:  Good   Screenings: PHQ2-9     Clinical Support from 01/07/2014 in [place]  PHQ-2 Total Score  0       Assessment and Plan: This patient is a 49 year old male with a history of bipolar disorder.  I am concerned about his tremor and I have suggested he have a neurology evaluation.  We will increase his Inderal LA to 80 mg every morning.  We will also increase Trintellix to 20 mg daily for depression.  He will continue trazodone 100 mg for sleep, Xanax 2 mg 3 times daily for anxiety, Lamictal 200 mg daily for mood stabilization, Latuda 60 mg daily for mood stabilization.  He will return to see me in 6 weeks.   [name], MD 07/04/2019, 10:56 AM

## 2019-07-09 ENCOUNTER — Telehealth (HOSPITAL_COMMUNITY): Payer: Self-pay | Admitting: *Deleted

## 2019-07-09 NOTE — Telephone Encounter (Signed)
Refills for the 20 mg were sent in last week. He can cut it in half and take 10 mg bid

## 2019-07-09 NOTE — Telephone Encounter (Signed)
LVM Per Provider: Refills for the 20 mg were sent in last week. He can cut it in half and take 10 mg bid

## 2019-07-09 NOTE — Telephone Encounter (Signed)
Patient called & stated that @ his 07/04/2019 appointment it was discussed  : We will also increase  Trintellix to 20 mg daily for depression.  Patient states he can't stay awake that as soon as he takes the med he falls asleep  & so deeply asleep That he doesn't hear anything  Even his phone ringing. 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  Patient stated that he was also suggested that he take a 10 mg in the Am  &  10 mg in the pm. Patient states he will begin to do this  BUT he will be out of [place] & requested a refill next appointment 08/27/19

## 2019-07-12 ENCOUNTER — Ambulatory Visit: Payer: Medicare Other | Attending: Internal Medicine

## 2019-07-12 DIAGNOSIS — Z23 Encounter for immunization: Secondary | ICD-10-CM

## 2019-07-12 NOTE — Progress Notes (Signed)
   Covid-19 Vaccination Clinic  Name:  Roger Duran    MRN: [id number] DOB: [birth year]  07/12/2019  [name] was observed post Covid-19 immunization for 30 minutes based on pre-vaccination screening without incident. He was provided with Vaccine Information Sheet and instruction to access the V-Safe system.   [name] was instructed to call 911 with any severe reactions post vaccine: Marland Kitchen Difficulty breathing  . Swelling of face and throat  . A fast heartbeat  . A bad rash all over body  . Dizziness and weakness   Immunizations Administered    Name Date Dose VIS Date Route   Moderna COVID-19 Vaccine 07/12/2019  9:43 AM 0.5 mL 04/02/2019 Intramuscular   Manufacturer: Moderna   Lot: [id number]   [id number]

## 2019-07-19 ENCOUNTER — Telehealth (HOSPITAL_COMMUNITY): Payer: Self-pay | Admitting: *Deleted

## 2019-07-19 NOTE — Telephone Encounter (Signed)
PATIENT CALLED & STATED  AS SUGGESTED  He can cut it in half and take 10 mg bid. HE'S HAVING PROBLEMS CUTTING  IN 1/2 W/ A PILL CUTTER. AND ASKED IF HE COULD JUST HAVE TRINTELLIX 10 MG  BID SENT IN ??

## 2019-07-19 NOTE — Telephone Encounter (Signed)
Insurance wont pay for twice a day

## 2019-07-22 NOTE — Telephone Encounter (Signed)
SPOKE WITH PATIENT & INFORMED PER PROVIDER: Insurance wont pay for twice a day

## 2019-08-06 ENCOUNTER — Other Ambulatory Visit: Payer: Self-pay

## 2019-08-06 MED ORDER — ACYCLOVIR 400 MG PO TABS: 400.0000 mg | ORAL_TABLET | Freq: Three times a day (TID) | ORAL | 10 refills | Status: DC

## 2019-08-13 ENCOUNTER — Ambulatory Visit: Payer: Medicare Other | Attending: Internal Medicine

## 2019-08-13 DIAGNOSIS — Z23 Encounter for immunization: Secondary | ICD-10-CM

## 2019-08-13 NOTE — Progress Notes (Signed)
   Covid-19 Vaccination Clinic  Name:  MILFRED ZEINER    MRN: [id number] DOB: May 05, 1970  08/13/2019  Mr. Winiarski was observed post Covid-19 immunization for 30 minutes based on pre-vaccination screening without incident. He was provided with Vaccine Information Sheet and instruction to access the V-Safe system.   Mr. Whitson was instructed to call 911 with any severe reactions post vaccine: Marland Kitchen Difficulty breathing  . Swelling of face and throat  . A fast heartbeat  . A bad rash all over body  . Dizziness and weakness   Immunizations Administered    Name Date Dose VIS Date Route   Moderna COVID-19 Vaccine 08/13/2019 11:08 AM 0.5 mL 04/02/2019 Intramuscular   Manufacturer: Moderna   LotMV:[id number]   [place]BE:[id number]

## 2019-08-13 NOTE — Progress Notes (Signed)
   Covid-19 Vaccination Clinic  Name:  Roger Duran    MRN: [id number] DOB: Jan 24, 1971  08/13/2019  Mr. Magill was observed post Covid-19 immunization for 15 minutes .  During the observation period, he experienced an adverse reaction with the following symptoms:  difficulty breathing and rapid heart rate.  Assessment : Time of assessment 1127. Alert and oriented, Anxious and Unlabored breathing. Patient reported 5-10 minutes after sitting for 15 minute observation, the feeling of his heart beating fast and hard, having SOB. He says he also has a feeling over his legs and arms of feeling "washed out."  1127: BP 139/82, P 77, Sat 100% RA (nurse felt pulse to wrist and it wasn't racing, just beating hard). Patient says his BP is usually not that high. 1129: BP 131/85, P 73 (reports feeling better) 1137: BP 120/85 P 75 (reports this is what his BP is usually running, reports feeling less better, says it's his anxiety kicking in) 1142: BP 121/78 P 72  Actions taken:  Vitals sign taken  VAERS form obtained and completed by Janan Ridge, RN.  Medications administered: No medication administered.  Disposition: Reports no further symptoms of adverse reaction after observation for additional 15 minutes minutes. Discharged home. 1145 ambulatory out.   Immunizations Administered    Name Date Dose VIS Date Route   Moderna COVID-19 Vaccine 08/13/2019 11:08 AM 0.5 mL 04/02/2019 Intramuscular   Manufacturer: Moderna   LotMV:[id number]   [place]BE:[id number]

## 2019-08-27 ENCOUNTER — Encounter (HOSPITAL_COMMUNITY): Payer: Self-pay | Admitting: Psychiatry

## 2019-08-27 ENCOUNTER — Other Ambulatory Visit: Payer: Self-pay

## 2019-08-27 ENCOUNTER — Telehealth (INDEPENDENT_AMBULATORY_CARE_PROVIDER_SITE_OTHER): Payer: Medicare Other | Admitting: Psychiatry

## 2019-08-27 DIAGNOSIS — F3130 Bipolar disorder, current episode depressed, mild or moderate severity, unspecified: Secondary | ICD-10-CM | POA: Diagnosis not present

## 2019-08-27 MED ORDER — GABAPENTIN 300 MG PO CAPS: 300.0000 mg | ORAL_CAPSULE | Freq: Three times a day (TID) | ORAL | 2 refills | Status: DC

## 2019-08-27 MED ORDER — VORTIOXETINE HBR 20 MG PO TABS: 20.0000 mg | ORAL_TABLET | Freq: Every day | ORAL | 2 refills | Status: DC

## 2019-08-27 MED ORDER — ALPRAZOLAM 2 MG PO TABS: 2.0000 mg | ORAL_TABLET | Freq: Three times a day (TID) | ORAL | 2 refills | Status: DC

## 2019-08-27 MED ORDER — TRAZODONE HCL 100 MG PO TABS: ORAL_TABLET | ORAL | 2 refills | Status: DC

## 2019-08-27 MED ORDER — LAMOTRIGINE 200 MG PO TABS: 200.0000 mg | ORAL_TABLET | Freq: Two times a day (BID) | ORAL | 2 refills | Status: DC

## 2019-08-27 MED ORDER — PROPRANOLOL HCL ER 120 MG PO CP24
120.0000 mg | ORAL_CAPSULE | Freq: Every day | ORAL | 2 refills | Status: DC
Start: 2019-08-27 — End: 2019-10-25

## 2019-08-27 MED ORDER — LURASIDONE HCL 40 MG PO TABS: 40.0000 mg | ORAL_TABLET | Freq: Every day | ORAL | 2 refills | Status: DC

## 2019-08-27 NOTE — Progress Notes (Signed)
Virtual Visit via Telephone Note  I connected with Roger Duran on 08/27/19 at  1:40 PM EDT by telephone and verified that I am speaking with the correct person using two identifiers.   I discussed the limitations, risks, security and privacy concerns of performing an evaluation and management service by telephone and the availability of in person appointments. I also discussed with the patient that there may be a patient responsible charge related to this service. The patient expressed understanding and agreed to proceed.    I discussed the assessment and treatment plan with the patient. The patient was provided an opportunity to ask questions and all were answered. The patient agreed with the plan and demonstrated an understanding of the instructions.   The patient was advised to call back or seek an in-person evaluation if the symptoms worsen or if the condition fails to improve as anticipated.  I provided 15 minutes of non-face-to-face time during this encounter.   [name], MD  [facility] MD/PA/NP OP Progress Note  08/27/2019 2:02 PM Roger Duran  MRN:  [id number]  Chief Complaint:  Chief Complaint    Anxiety; Depression; Manic Behavior; Follow-up     HPI: This patient is a 49 year old single white male who lives with his grandmother and [place].  He identifies as gay but is not in a relationship right now.  He is on disability for bipolar disorder.  The patient returns for follow-up after 6 weeks.  He had some injections in his back earlier this month and it has really made a big difference.  He is able to do a lot more things and he is gratified about this.  He states the increase in Trintellix is generally helped his mood although he feels like "I am not sure if it is working."  I reminded him that antidepressants do not make people feel euphoric or ecstatic but he is functioning well.  He does note however that the tremor in his hands is worse.  This seems to correlate with  getting on the higher dose of Latuda so I think we need to cut it back and increase his Inderal and he agrees.  He is also going to ask his primary doctor for referral to neurology.  He denies thoughts of suicide or self-harm. Visit Diagnosis:    ICD-10-CM   1. Bipolar I disorder, most recent episode depressed ([place])  F31.30     Past Psychiatric History: Hospitalization in 2009 after suicide attempt  Past Medical History:  Past Medical History:  Diagnosis Date  . Anxiety   . Bipolar 1 disorder ([place])   . Depression   . Mania ([place])   . Psoriasis     Past Surgical History:  Procedure Laterality Date  . APPENDECTOMY    . COLONOSCOPY N/A 11/14/2014   Procedure: COLONOSCOPY;  Surgeon: Rogene Houston, MD;  [facility];  Service: Endoscopy;  Laterality: N/A;  11:10 - moved to 8:30 - Ann to notify  . TONSILLECTOMY    . WRIST SURGERY      Family Psychiatric History: see below  Family History:  Family History  Problem Relation Age of Onset  . Hyperlipidemia Mother   . Heart disease Father   . Anxiety disorder Father   . Bipolar disorder Maternal Grandfather     Social History:  Social History   Socioeconomic History  . Marital status: Single    Spouse name: Not on file  . Number of children: Not on file  .  Years of education: Not on file  . Highest education level: Not on file  Occupational History  . Not on file  Tobacco Use  . Smoking status: Current Every Day Smoker    Packs/day: 0.50    Types: Cigarettes, E-cigarettes  . Smokeless tobacco: Former Systems developer  . Tobacco comment: Per pt he uses E-cigarettes  Substance and Sexual Activity  . Alcohol use: No    Alcohol/week: 0.0 standard drinks    Comment: rarely  . Drug use: No    Comment: 1995-2000 PER PT HE DID A LOT OF COCAINE AND PILLS AND DRANK A LOT  . Sexual activity: Yes    Partners: Male    Birth control/protection: Condom  Other Topics Concern  . Not on file  Social History Narrative  . Not on  file   Social Determinants of Health   Financial Resource Strain:   . Difficulty of Paying Living Expenses:   Food Insecurity:   . Worried About Charity fundraiser in the Last Year:   . Arboriculturist in the Last Year:   Transportation Needs:   . Film/video editor (Medical):   Marland Kitchen Lack of Transportation (Non-Medical):   Physical Activity:   . Days of Exercise per Week:   . Minutes of Exercise per Session:   Stress:   . Feeling of Stress :   Social Connections:   . Frequency of Communication with Friends and Family:   . Frequency of Social Gatherings with Friends and Family:   . Attends Religious Services:   . Active Member of Clubs or Organizations:   . Attends Archivist Meetings:   Marland Kitchen Marital Status:     Allergies:  Allergies  Allergen Reactions  . Penicillins Itching and Rash    Metabolic Disorder Labs: No results found for: HGBA1C, MPG No results found for: PROLACTIN No results found for: CHOL, TRIG, HDL, CHOLHDL, VLDL, LDLCALC No results found for: TSH  Therapeutic Level Labs: Lab Results  Component Value Date   LITHIUM 1.10 10/15/2014   No results found for: VALPROATE No components found for:  CBMZ  Current Medications: Current Outpatient Medications  Medication Sig Dispense Refill  . acyclovir (ZOVIRAX) 400 MG tablet Take 1 tablet (400 mg total) by mouth 3 (three) times daily. 60 tablet 10  . Adalimumab (HUMIRA PEN) 40 MG/0.8ML PNKT     . alprazolam (XANAX) 2 MG tablet Take 1 tablet (2 mg total) by mouth 3 (three) times daily. 90 tablet 2  . baclofen (LIORESAL) 10 MG tablet Take 10 mg by mouth 2 (two) times daily as needed.    . cycloSPORINE (RESTASIS) 0.05 % ophthalmic emulsion Place 1 drop into both eyes 2 (two) times daily.    Marland Kitchen gabapentin (NEURONTIN) 300 MG capsule Take 1 capsule (300 mg total) by mouth 3 (three) times daily. 90 capsule 2  . lamoTRIgine (LAMICTAL) 200 MG tablet Take 1 tablet (200 mg total) by mouth 2 (two) times daily.  60 tablet 2  . loratadine (CLARITIN) 10 MG tablet Take 10 mg by mouth daily.    Marland Kitchen lurasidone (LATUDA) 40 MG TABS tablet Take 1 tablet (40 mg total) by mouth daily with breakfast. 30 tablet 2  . omeprazole (PRILOSEC) 20 MG capsule Take 20 mg by mouth daily.    . propranolol ER (INDERAL LA) 120 MG 24 hr capsule Take 1 capsule (120 mg total) by mouth daily. 30 capsule 2  . propranolol ER (INDERAL LA) 80 MG 24 hr  capsule Take 1 capsule (80 mg total) by mouth daily. 30 capsule 11  . Sennosides (SENOKOT PO) Take by mouth as needed.    Marland Kitchen tenofovir (VIREAD) 300 MG tablet Take 300 mg by mouth daily.    . traZODone (DESYREL) 100 MG tablet Take one or two at bedtime 60 tablet 2  . vortioxetine HBr (TRINTELLIX) 20 MG TABS tablet Take 1 tablet (20 mg total) by mouth daily. 30 tablet 2  . Wheat Dextrin (BENEFIBER DRINK MIX) PACK Take 4 g by mouth at bedtime. (Patient taking differently: Take 4 g by mouth as needed. )     No current facility-administered medications for this visit.     Musculoskeletal: Strength & Muscle Tone: within normal limits Gait & Station: normal Patient leans: N/A  Psychiatric Specialty Exam: Review of Systems  Neurological: Positive for tremors.    There were no vitals taken for this visit.There is no height or weight on file to calculate BMI.  General Appearance: NA  Eye Contact:  NA  Speech:  Clear and Coherent  Volume:  Normal  Mood:  Euthymic  Affect:  NA  Thought Process:  Goal Directed  Orientation:  Full (Time, Place, and Person)  Thought Content: Rumination   Suicidal Thoughts:  No  Homicidal Thoughts:  No  Memory:  Immediate;   Good Recent;   Good Remote;   Good  Judgement:  Good  Insight:  Good  Psychomotor Activity:  Tremor  Concentration:  Concentration: Good and Attention Span: Good  Recall:  Good  Fund of Knowledge: Good  Language: Good  Akathisia:  No  Handed:  Right  AIMS (if indicated): not done  Assets:  Communication Skills Desire for  Improvement Physical Health Resilience Social Support Talents/Skills  ADL's:  Intact  Cognition: WNL  Sleep:  Good   Screenings: PHQ2-9     Clinical Support from 01/07/2014 in [place]  PHQ-2 Total Score  0       Assessment and Plan: This patient is a 49 year old male with a history of bipolar disorder.  He is still reporting tremors so I will increase his Inderal LA to 120 mg every morning.  We will also decrease Latuda to 40 mg daily for mood stabilization.  He will continue Trintellix 20 mg daily for depression, trazodone 100 mg daily for sleep.  Xanax 2 mg 3 times daily for anxiety and Lamictal 200 mg daily for mood stabilization.  He is planning to see neurology for his tremor.  He will return to see me in 6 weeks   [name], MD 08/27/2019, 2:02 PM

## 2019-09-03 ENCOUNTER — Telehealth (HOSPITAL_COMMUNITY): Payer: Self-pay

## 2019-09-03 ENCOUNTER — Other Ambulatory Visit (HOSPITAL_COMMUNITY): Payer: Self-pay | Admitting: Psychiatry

## 2019-09-03 MED ORDER — LATUDA 60 MG PO TABS: 60.0000 mg | ORAL_TABLET | Freq: Every day | ORAL | 2 refills | Status: DC

## 2019-09-03 MED ORDER — PROPRANOLOL HCL ER 80 MG PO CP24: 80.0000 mg | ORAL_CAPSULE | Freq: Every day | ORAL | 11 refills | Status: DC

## 2019-09-03 NOTE — Telephone Encounter (Signed)
Patient called and stated that his medication is making him dizzy, making him walk and drive "like a drunk", he's running into walls,and bumping into things. He's also depressed and manic with lower dosage of Latuda. He also still has bad tremors. He stated that he's seeing his PCP tomorrow and getting referred to a Neurologist. Please review and advise. Thank you.

## 2019-09-03 NOTE — Telephone Encounter (Signed)
Tell him to go back to 80 mg of Inderal LA and 60 mg of Latuda. Scrips have been sent in

## 2019-09-06 NOTE — Telephone Encounter (Signed)
Relayed message to patient

## 2019-09-12 ENCOUNTER — Telehealth (HOSPITAL_COMMUNITY): Payer: Self-pay

## 2019-09-13 NOTE — Telephone Encounter (Signed)
Reviewed and I agree with trial of primidone for tremor

## 2019-09-26 ENCOUNTER — Telehealth (HOSPITAL_COMMUNITY): Payer: Self-pay | Admitting: *Deleted

## 2019-09-26 NOTE — Telephone Encounter (Signed)
Patient Roger Duran stating that he went to the neurologist and they put him on Primidone. Per pt, he asked 2 pharmacist about the interactions with his Latuda and they informed him that this medication can decrease the effectiveness of the Latuda. Per pt he does not know what to do and want to know if provider wants to increase his Latuda or just put him on something completely different.   Staff called patient and Roger Duran to call office back to see if he also notified the neurologist with this information about the Primidone decreasing the effectiveness of the [place] and if they still want to keep him on this medication as well and see what they told him. Office number was provided on voicemail.

## 2019-09-26 NOTE — Telephone Encounter (Signed)
For now, I would start the primidone and see if it helps his tremor. The primidone can cause the latuda to be metabolized more rapidly. If his wood swings get worse we can increase latuda

## 2019-09-27 ENCOUNTER — Encounter (HOSPITAL_COMMUNITY): Payer: Self-pay | Admitting: *Deleted

## 2019-09-27 NOTE — Telephone Encounter (Signed)
LMOM and informed patient to call office with response and also informed patient on his voicemail that staff would send provider's response to his question via his mychart. Office number and hours provided on patient voicemail.

## 2019-10-03 ENCOUNTER — Other Ambulatory Visit (HOSPITAL_COMMUNITY): Payer: Self-pay | Admitting: Psychiatry

## 2019-10-03 ENCOUNTER — Encounter (HOSPITAL_COMMUNITY): Payer: Self-pay | Admitting: *Deleted

## 2019-10-03 ENCOUNTER — Telehealth (HOSPITAL_COMMUNITY): Payer: Self-pay | Admitting: *Deleted

## 2019-10-03 MED ORDER — LURASIDONE HCL 80 MG PO TABS: 80.0000 mg | ORAL_TABLET | Freq: Every day | ORAL | 2 refills | Status: DC

## 2019-10-03 NOTE — Telephone Encounter (Signed)
Patient called and [facility] stating he would like provider to call him. Per pt he is having several manic episodes and depression episodes. Per pt his neurologist had doubled his premadon. Per pt he wants to speak with provider and to see if provider can please call him back.

## 2019-10-03 NOTE — Telephone Encounter (Signed)
Latuda increased to 80 mg

## 2019-10-03 NOTE — Telephone Encounter (Signed)
LMOM

## 2019-10-25 ENCOUNTER — Encounter (HOSPITAL_COMMUNITY): Payer: Self-pay | Admitting: Psychiatry

## 2019-10-25 ENCOUNTER — Telehealth (INDEPENDENT_AMBULATORY_CARE_PROVIDER_SITE_OTHER): Payer: Medicare Other | Admitting: Psychiatry

## 2019-10-25 ENCOUNTER — Other Ambulatory Visit: Payer: Self-pay

## 2019-10-25 DIAGNOSIS — F3130 Bipolar disorder, current episode depressed, mild or moderate severity, unspecified: Secondary | ICD-10-CM | POA: Diagnosis not present

## 2019-10-25 MED ORDER — GABAPENTIN 300 MG PO CAPS: 300.0000 mg | ORAL_CAPSULE | Freq: Three times a day (TID) | ORAL | 2 refills | Status: DC

## 2019-10-25 MED ORDER — LURASIDONE HCL 80 MG PO TABS: 80.0000 mg | ORAL_TABLET | Freq: Every day | ORAL | 2 refills | Status: DC

## 2019-10-25 MED ORDER — VORTIOXETINE HBR 20 MG PO TABS: 20.0000 mg | ORAL_TABLET | Freq: Every day | ORAL | 2 refills | Status: DC

## 2019-10-25 MED ORDER — TRAZODONE HCL 100 MG PO TABS: ORAL_TABLET | ORAL | 2 refills | Status: DC

## 2019-10-25 MED ORDER — ALPRAZOLAM 2 MG PO TABS: 2.0000 mg | ORAL_TABLET | Freq: Four times a day (QID) | ORAL | 2 refills | Status: DC

## 2019-10-25 MED ORDER — LAMOTRIGINE 200 MG PO TABS: 200.0000 mg | ORAL_TABLET | Freq: Two times a day (BID) | ORAL | 2 refills | Status: DC

## 2019-10-25 NOTE — Progress Notes (Signed)
Virtual Visit via Video Note  I connected with Roger Duran on 10/25/19 at 11:20 AM EDT by a video enabled telemedicine application and verified that I am speaking with the correct person using two identifiers.   I discussed the limitations of evaluation and management by telemedicine and the availability of in person appointments. The patient expressed understanding and agreed to proceed    I discussed the assessment and treatment plan with the patient. The patient was provided an opportunity to ask questions and all were answered. The patient agreed with the plan and demonstrated an understanding of the instructions.   The patient was advised to call back or seek an in-person evaluation if the symptoms worsen or if the condition fails to improve as anticipated.  I provided 15 minutes of non-face-to-face time during this encounter. Location: Provider office, patient home  [name], MD  [facility] MD/PA/NP OP Progress Note  10/25/2019 11:40 AM Roger Duran  MRN:  [id number]  Chief Complaint:  Chief Complaint    Depression; Manic Behavior; Anxiety; Follow-up     HPI: This patient is a 49 year old single white male lives with his grandmother and [place].  He identifies as gay but is not in a relationship right now.  He is on disability for bipolar disorder.  The patient returns for follow-up after 2 months.  He was having significant trouble with tremor and is now on primidone from a neurologist.  He is up to 100 mg.  It is helped some but he still has some shakiness in both hands and sometimes has to use both hands to hold a glass for example.  Generally his mood has been stable.  We did increase his Latuda because he felt like he was having more mood swings and he is up to 80 mg now.  The mood swings have subsided.  A friend of his has moved and has a roommate and this is helped a lot because he has someone closer to his age to talk to.  He is sleeping well at night and denies serious  depression or suicidal ideation.  Since his tremor is still present he has been referred to a movement disorder clinic at [facility] but has to wait a long time to get in,  Complaining of lots of anxiety which he thinks worsened with the primidone.  I offered to increase the Xanax a little bit and he thinks this might be helpful. Visit Diagnosis:    ICD-10-CM   1. Bipolar I disorder, most recent episode depressed ([place])  F31.30     Past Psychiatric History: Hospitalization in 2009 after suicide attempt  Past Medical History:  Past Medical History:  Diagnosis Date  . Anxiety   . Bipolar 1 disorder ([place])   . Depression   . Mania ([place])   . Psoriasis     Past Surgical History:  Procedure Laterality Date  . APPENDECTOMY    . COLONOSCOPY N/A 11/14/2014   Procedure: COLONOSCOPY;  Surgeon: Rogene Houston, MD;  [facility];  Service: Endoscopy;  Laterality: N/A;  11:10 - moved to 8:30 - Ann to notify  . TONSILLECTOMY    . WRIST SURGERY      Family Psychiatric History: See below  Family History:  Family History  Problem Relation Age of Onset  . Hyperlipidemia Mother   . Heart disease Father   . Anxiety disorder Father   . Bipolar disorder Maternal Grandfather     Social History:  Social  History   Socioeconomic History  . Marital status: Single    Spouse name: Not on file  . Number of children: Not on file  . Years of education: Not on file  . Highest education level: Not on file  Occupational History  . Not on file  Tobacco Use  . Smoking status: Current Every Day Smoker    Packs/day: 0.50    Types: Cigarettes, E-cigarettes  . Smokeless tobacco: Former Systems developer  . Tobacco comment: Per pt he uses E-cigarettes  Substance and Sexual Activity  . Alcohol use: No    Alcohol/week: 0.0 standard drinks    Comment: rarely  . Drug use: No    Comment: 1995-2000 PER PT HE DID A LOT OF COCAINE AND PILLS AND DRANK A LOT  . Sexual activity: Yes    Partners: Male     Birth control/protection: Condom  Other Topics Concern  . Not on file  Social History Narrative  . Not on file   Social Determinants of Health   Financial Resource Strain:   . Difficulty of Paying Living Expenses:   Food Insecurity:   . Worried About Charity fundraiser in the Last Year:   . Arboriculturist in the Last Year:   Transportation Needs:   . Film/video editor (Medical):   Marland Kitchen Lack of Transportation (Non-Medical):   Physical Activity:   . Days of Exercise per Week:   . Minutes of Exercise per Session:   Stress:   . Feeling of Stress :   Social Connections:   . Frequency of Communication with Friends and Family:   . Frequency of Social Gatherings with Friends and Family:   . Attends Religious Services:   . Active Member of Clubs or Organizations:   . Attends Archivist Meetings:   Marland Kitchen Marital Status:     Allergies:  Allergies  Allergen Reactions  . Penicillins Itching and Rash    Metabolic Disorder Labs: No results found for: HGBA1C, MPG No results found for: PROLACTIN No results found for: CHOL, TRIG, HDL, CHOLHDL, VLDL, LDLCALC No results found for: TSH  Therapeutic Level Labs: Lab Results  Component Value Date   LITHIUM 1.10 10/15/2014   No results found for: VALPROATE No components found for:  CBMZ  Current Medications: Current Outpatient Medications  Medication Sig Dispense Refill  . acyclovir (ZOVIRAX) 400 MG tablet Take 1 tablet (400 mg total) by mouth 3 (three) times daily. 60 tablet 10  . Adalimumab (HUMIRA PEN) 40 MG/0.8ML PNKT     . alprazolam (XANAX) 2 MG tablet Take 1 tablet (2 mg total) by mouth in the morning, at noon, in the evening, and at bedtime. 120 tablet 2  . baclofen (LIORESAL) 10 MG tablet Take 10 mg by mouth 2 (two) times daily as needed.    . cycloSPORINE (RESTASIS) 0.05 % ophthalmic emulsion Place 1 drop into both eyes 2 (two) times daily.    Marland Kitchen gabapentin (NEURONTIN) 300 MG capsule Take 1 capsule (300 mg  total) by mouth 3 (three) times daily. 90 capsule 2  . lamoTRIgine (LAMICTAL) 200 MG tablet Take 1 tablet (200 mg total) by mouth 2 (two) times daily. 60 tablet 2  . loratadine (CLARITIN) 10 MG tablet Take 10 mg by mouth daily.    Marland Kitchen lurasidone (LATUDA) 80 MG TABS tablet Take 1 tablet (80 mg total) by mouth daily. 30 tablet 2  . omeprazole (PRILOSEC) 20 MG capsule Take 20 mg by mouth daily.    Marland Kitchen  primidone (MYSOLINE) 50 MG tablet Take 100 mg by mouth at bedtime.    . Sennosides (SENOKOT PO) Take by mouth as needed.    Marland Kitchen tenofovir (VIREAD) 300 MG tablet Take 300 mg by mouth daily.    . traZODone (DESYREL) 100 MG tablet Take one or two at bedtime 60 tablet 2  . vortioxetine HBr (TRINTELLIX) 20 MG TABS tablet Take 1 tablet (20 mg total) by mouth daily. 30 tablet 2  . Wheat Dextrin (BENEFIBER DRINK MIX) PACK Take 4 g by mouth at bedtime. (Patient taking differently: Take 4 g by mouth as needed. )     No current facility-administered medications for this visit.     Musculoskeletal: Strength & Muscle Tone: within normal limits Gait & Station: normal Patient leans: N/A  Psychiatric Specialty Exam: Review of Systems  Neurological: Positive for tremors.  Psychiatric/Behavioral: The patient is nervous/anxious.   All other systems reviewed and are negative.   There were no vitals taken for this visit.There is no height or weight on file to calculate BMI.  General Appearance: Casual and Fairly Groomed  Eye Contact:  Good  Speech:  Clear and Coherent  Volume:  Normal  Mood:  Anxious  Affect:  Appropriate and Congruent  Thought Process:  Goal Directed  Orientation:  Full (Time, Place, and Person)  Thought Content: WDL   Suicidal Thoughts:  No  Homicidal Thoughts:  No  Memory:  Immediate;   Good Recent;   Good Remote;   Good  Judgement:  Good  Insight:  Good  Psychomotor Activity:  Tremor  Concentration:  Concentration: Good and Attention Span: Good  Recall:  Good  Fund of Knowledge:  Good  Language: Good  Akathisia:  No  Handed:  Right  AIMS (if indicated): Resting tremor in both hands which worsens with intention  Assets:  Communication Skills Desire for Improvement Resilience Social Support Talents/Skills  ADL's:  Intact  Cognition: WNL  Sleep:  Good   Screenings: PHQ2-9     Clinical Support from 01/07/2014 in [place]  PHQ-2 Total Score 0       Assessment and Plan: This patient is a 49 year old male with a history of bipolar disorder.  Decreasing Latuda worsened his mood so we will keep it at 80 mg daily.  He will continue Trintellix 20 mg daily for depression trazodone 100 mg daily for sleep, Lamictal 200 mg daily for mood stabilization and gabapentin 300 mg 3 times daily for anxiety.  He will also increase Xanax to 2 mg 4 times daily for anxiety.  He will return to see me in 2 months   [name], MD 10/25/2019, 11:40 AM

## 2019-10-28 ENCOUNTER — Telehealth (HOSPITAL_COMMUNITY): Payer: Self-pay | Admitting: *Deleted

## 2019-10-28 NOTE — Telephone Encounter (Signed)
Per pt not long after he takes his med in the morning he gets blurry vision and double vision but it goes away and comes back in the afternoon. Per pt then it happens again at 2 with his even meds. [phone]. Per pt he forgot to let doctor know this during his previous visit. Per pt it was a lot like back in the day. Patient would like for provider to call him.

## 2019-10-29 ENCOUNTER — Encounter (HOSPITAL_COMMUNITY): Payer: Self-pay | Admitting: *Deleted

## 2019-10-29 NOTE — Telephone Encounter (Signed)
lmtcb

## 2019-10-29 NOTE — Telephone Encounter (Signed)
noted 

## 2019-10-30 NOTE — Telephone Encounter (Signed)
With all the issues going on with Roger Duran, please schedule an appt

## 2019-11-06 ENCOUNTER — Encounter (HOSPITAL_COMMUNITY): Payer: Self-pay | Admitting: Psychiatry

## 2019-11-06 ENCOUNTER — Other Ambulatory Visit: Payer: Self-pay

## 2019-11-06 ENCOUNTER — Telehealth (INDEPENDENT_AMBULATORY_CARE_PROVIDER_SITE_OTHER): Payer: Medicare Other | Admitting: Psychiatry

## 2019-11-06 DIAGNOSIS — F3130 Bipolar disorder, current episode depressed, mild or moderate severity, unspecified: Secondary | ICD-10-CM

## 2019-11-06 MED ORDER — LAMOTRIGINE 200 MG PO TABS: 200.0000 mg | ORAL_TABLET | Freq: Every day | ORAL | 2 refills | Status: DC

## 2019-11-06 NOTE — Progress Notes (Signed)
Virtual Visit via Video Note  I connected with Roger Duran on 11/06/19 at  9:20 AM EDT by a video enabled telemedicine application and verified that I am speaking with the correct person using two identifiers.   I discussed the limitations of evaluation and management by telemedicine and the availability of in person appointments. The patient expressed understanding and agreed to proceed    I discussed the assessment and treatment plan with the patient. The patient was provided an opportunity to ask questions and all were answered. The patient agreed with the plan and demonstrated an understanding of the instructions.   The patient was advised to call back or seek an in-person evaluation if the symptoms worsen or if the condition fails to improve as anticipated.  I provided 15 minutes of non-face-to-face time during this encounter.  Location: Provider office, patient home Roger Spiller, MD  [facility] MD/PA/NP OP Progress Note  11/06/2019 9:33 AM Roger Duran  MRN:  [id number]  Chief Complaint:  Chief Complaint    Manic Behavior; Depression; Anxiety; Follow-up     HPI: This patient is a 49 year old single white male lives with his grandmother and [place].  He identifies as gay but is not in a current relationship.  He is on disability for bipolar disorder.  The patient is seen after 2 weeks as a work in.  He had called stating that he was having double vision that lasted about an hour after he took his morning meds and as well after his evening meds.  This happened to him in the past and it seemed to have something to do with the Lamictal.  He is on 200 mg twice daily.  I suggested for now that he cut it down to just once a day and take it at night.  He also wonders if his antidepressant is effective but he seems to be doing more and is helping take care of his grandmother and engaging with people fairly well.  He is no longer having as much tremor now that his primidone has been increased  as well as the Xanax.  He denies any thoughts of suicide or self-harm. Visit Diagnosis:    ICD-10-CM   1. Bipolar I disorder, most recent episode depressed ([place])  F31.30     Past Psychiatric History: Hospitalization in 2009 after suicide attempt  Past Medical History:  Past Medical History:  Diagnosis Date  . Anxiety   . Bipolar 1 disorder ([place])   . Depression   . Mania ([place])   . Psoriasis     Past Surgical History:  Procedure Laterality Date  . APPENDECTOMY    . COLONOSCOPY N/A 11/14/2014   Procedure: COLONOSCOPY;  Surgeon: Rogene Houston, MD;  [facility];  Service: Endoscopy;  Laterality: N/A;  11:10 - moved to 8:30 - Ann to notify  . TONSILLECTOMY    . WRIST SURGERY      Family Psychiatric History: see below  Family History:  Family History  Problem Relation Age of Onset  . Hyperlipidemia Mother   . Heart disease Father   . Anxiety disorder Father   . Bipolar disorder Maternal Grandfather     Social History:  Social History   Socioeconomic History  . Marital status: Single    Spouse name: Not on file  . Number of children: Not on file  . Years of education: Not on file  . Highest education level: Not on file  Occupational History  . Not on file  Tobacco Use  . Smoking status: Current Every Day Smoker    Packs/day: 0.50    Types: Cigarettes, E-cigarettes  . Smokeless tobacco: Former Systems developer  . Tobacco comment: Per pt he uses E-cigarettes  Substance and Sexual Activity  . Alcohol use: No    Alcohol/week: 0.0 standard drinks    Comment: rarely  . Drug use: No    Comment: 1995-2000 PER PT HE DID A LOT OF COCAINE AND PILLS AND DRANK A LOT  . Sexual activity: Yes    Partners: Male    Birth control/protection: Condom  Other Topics Concern  . Not on file  Social History Narrative  . Not on file   Social Determinants of Health   Financial Resource Strain:   . Difficulty of Paying Living Expenses:   Food Insecurity:   . Worried About Paediatric nurse in the Last Year:   . Arboriculturist in the Last Year:   Transportation Needs:   . Film/video editor (Medical):   Marland Kitchen Lack of Transportation (Non-Medical):   Physical Activity:   . Days of Exercise per Week:   . Minutes of Exercise per Session:   Stress:   . Feeling of Stress :   Social Connections:   . Frequency of Communication with Friends and Family:   . Frequency of Social Gatherings with Friends and Family:   . Attends Religious Services:   . Active Member of Clubs or Organizations:   . Attends Archivist Meetings:   Marland Kitchen Marital Status:     Allergies:  Allergies  Allergen Reactions  . Penicillins Itching and Rash    Metabolic Disorder Labs: No results found for: HGBA1C, MPG No results found for: PROLACTIN No results found for: CHOL, TRIG, HDL, CHOLHDL, VLDL, LDLCALC No results found for: TSH  Therapeutic Level Labs: Lab Results  Component Value Date   LITHIUM 1.10 10/15/2014   No results found for: VALPROATE No components found for:  CBMZ  Current Medications: Current Outpatient Medications  Medication Sig Dispense Refill  . acyclovir (ZOVIRAX) 400 MG tablet Take 1 tablet (400 mg total) by mouth 3 (three) times daily. 60 tablet 10  . Adalimumab (HUMIRA PEN) 40 MG/0.8ML PNKT     . alprazolam (XANAX) 2 MG tablet Take 1 tablet (2 mg total) by mouth in the morning, at noon, in the evening, and at bedtime. 120 tablet 2  . baclofen (LIORESAL) 10 MG tablet Take 10 mg by mouth 2 (two) times daily as needed.    . cycloSPORINE (RESTASIS) 0.05 % ophthalmic emulsion Place 1 drop into both eyes 2 (two) times daily.    Marland Kitchen gabapentin (NEURONTIN) 300 MG capsule Take 1 capsule (300 mg total) by mouth 3 (three) times daily. 90 capsule 2  . lamoTRIgine (LAMICTAL) 200 MG tablet Take 1 tablet (200 mg total) by mouth at bedtime. 30 tablet 2  . loratadine (CLARITIN) 10 MG tablet Take 10 mg by mouth daily.    Marland Kitchen lurasidone (LATUDA) 80 MG TABS tablet Take 1 tablet  (80 mg total) by mouth daily. 30 tablet 2  . omeprazole (PRILOSEC) 20 MG capsule Take 20 mg by mouth daily.    . primidone (MYSOLINE) 50 MG tablet Take 100 mg by mouth at bedtime.    . Sennosides (SENOKOT PO) Take by mouth as needed.    Marland Kitchen tenofovir (VIREAD) 300 MG tablet Take 300 mg by mouth daily.    . traZODone (DESYREL) 100 MG tablet Take one or  two at bedtime 60 tablet 2  . vortioxetine HBr (TRINTELLIX) 20 MG TABS tablet Take 1 tablet (20 mg total) by mouth daily. 30 tablet 2  . Wheat Dextrin (BENEFIBER DRINK MIX) PACK Take 4 g by mouth at bedtime. (Patient taking differently: Take 4 g by mouth as needed. )     No current facility-administered medications for this visit.     Musculoskeletal: Strength & Muscle Tone: within normal limits Gait & Station: normal Patient leans: N/A  Psychiatric Specialty Exam: Review of Systems  Eyes: Positive for visual disturbance.  All other systems reviewed and are negative.   There were no vitals taken for this visit.There is no height or weight on file to calculate BMI.  General Appearance: Casual and Fairly Groomed  Eye Contact:  Good  Speech:  Clear and Coherent  Volume:  Normal  Mood:  Euthymic  Affect:  Appropriate and Congruent  Thought Process:  Goal Directed  Orientation:  Full (Time, Place, and Person)  Thought Content: Rumination   Suicidal Thoughts:  No  Homicidal Thoughts:  No  Memory:  Immediate;   Good Recent;   Good Remote;   Fair  Judgement:  Good  Insight:  Good  Psychomotor Activity:  Tremor  Concentration:  Concentration: Good and Attention Span: Good  Recall:  Good  Fund of Knowledge: Good  Language: Good  Akathisia:  No  Handed:  Right  AIMS (if indicated): not done  Assets:  Communication Skills Desire for Improvement Resilience Social Support Talents/Skills  ADL's:  Intact  Cognition: WNL  Sleep:  Good   Screenings: PHQ2-9     Clinical Support from 01/07/2014 in [place]   PHQ-2 Total Score 0       Assessment and Plan: This patient is a 49 year old male with a history of bipolar disorder.  He is developing double vision which she thinks happened before with Lamictal.  We will therefore cut the Lamictal down to 200 mg daily.  He will continue lurasidone 80 mg daily for mood stabilization, Trintellix 20 mg daily for depression, gabapentin 300 mg 3 times daily for anxiety and Xanax 2 mg 4 times daily for anxiety.  He will return to see me in 3 weeks.  He asked for referral for new therapist because his current therapist is currently medically indisposed.   Roger Spiller, MD 11/06/2019, 9:33 AM

## 2019-11-13 ENCOUNTER — Telehealth (HOSPITAL_COMMUNITY): Payer: Self-pay | Admitting: *Deleted

## 2019-11-13 NOTE — Telephone Encounter (Signed)
Patient call stating he would like to have Dr. Harrington Duran to call him back. Per pt the change in his medications did not help[ him. Per pt he wants to talk to Dr. Harrington Duran. [phone].

## 2019-11-13 NOTE — Telephone Encounter (Signed)
He has gone back to lamictal bid and is feeling much better now

## 2019-11-27 ENCOUNTER — Other Ambulatory Visit: Payer: Self-pay

## 2019-11-27 ENCOUNTER — Telehealth (INDEPENDENT_AMBULATORY_CARE_PROVIDER_SITE_OTHER): Payer: Medicare Other | Admitting: Psychiatry

## 2019-11-27 ENCOUNTER — Encounter (HOSPITAL_COMMUNITY): Payer: Self-pay | Admitting: Psychiatry

## 2019-11-27 DIAGNOSIS — F3130 Bipolar disorder, current episode depressed, mild or moderate severity, unspecified: Secondary | ICD-10-CM

## 2019-11-27 MED ORDER — LAMOTRIGINE 200 MG PO TABS: 200.0000 mg | ORAL_TABLET | Freq: Two times a day (BID) | ORAL | 2 refills | Status: DC

## 2019-11-27 MED ORDER — GABAPENTIN 300 MG PO CAPS: 300.0000 mg | ORAL_CAPSULE | Freq: Three times a day (TID) | ORAL | 2 refills | Status: DC

## 2019-11-27 MED ORDER — TRAZODONE HCL 100 MG PO TABS: ORAL_TABLET | ORAL | 2 refills | Status: DC

## 2019-11-27 MED ORDER — VORTIOXETINE HBR 20 MG PO TABS: 20.0000 mg | ORAL_TABLET | Freq: Every day | ORAL | 2 refills | Status: DC

## 2019-11-27 MED ORDER — LURASIDONE HCL 80 MG PO TABS: 80.0000 mg | ORAL_TABLET | Freq: Every day | ORAL | 2 refills | Status: DC

## 2019-11-27 MED ORDER — ALPRAZOLAM 2 MG PO TABS: 2.0000 mg | ORAL_TABLET | Freq: Four times a day (QID) | ORAL | 2 refills | Status: DC

## 2019-11-27 NOTE — Progress Notes (Signed)
Virtual Visit via Video Note  I connected with Roger Duran on 11/27/19 at  9:20 AM EDT by a video enabled telemedicine application and verified that I am speaking with the correct person using two identifiers.   I discussed the limitations of evaluation and management by telemedicine and the availability of in person appointments. The patient expressed understanding and agreed to proceed    I discussed the assessment and treatment plan with the patient. The patient was provided an opportunity to ask questions and all were answered. The patient agreed with the plan and demonstrated an understanding of the instructions.   The patient was advised to call back or seek an in-person evaluation if the symptoms worsen or if the condition fails to improve as anticipated.  I provided 15 minutes of non-face-to-face time during this encounter. Location: provider home, patient home  [name], MD  [facility] MD/PA/NP OP Progress Note  11/27/2019 9:33 AM Roger Duran  MRN:  [id number]  Chief Complaint:  Chief Complaint    Anxiety; Depression; Manic Behavior; Follow-up     HPI: This patient is a 49 year old single white male who lives with his grandmother and a roommate and [place].  He identifies as gay but is not in a current relationship.  He is on disability for bipolar disorder  The patient returns for follow-up after about 3 weeks.  Last time we had tried cutting back his Lamictal because he complained of blurred vision.  However he became very depressed and agitated without it.  He called me about a week later and stated that this was not going to work for him.  He has gone back to Lamictal 200 mg twice daily and this seems to be working out much better.  He is spacing it out enough from his other medications so that is not causing blurred vision anymore.  His tremor is well controlled with primidone and Xanax.  He is sleeping well with trazodone.  He has to take his roommate to work and pick  him up and this is actually getting him on a good sleep schedule.  He denies thoughts of self-harm or suicidal ideation.  He denies any manic symptoms such as agitation or racing thoughts. Visit Diagnosis:    ICD-10-CM   1. Bipolar I disorder, most recent episode depressed ([place])  F31.30     Past Psychiatric History: Hospitalization in 2009 after suicide attempt  Past Medical History:  Past Medical History:  Diagnosis Date  . Anxiety   . Bipolar 1 disorder ([place])   . Depression   . Mania ([place])   . Psoriasis     Past Surgical History:  Procedure Laterality Date  . APPENDECTOMY    . COLONOSCOPY N/A 11/14/2014   Procedure: COLONOSCOPY;  Surgeon: Rogene Houston, MD;  [facility];  Service: Endoscopy;  Laterality: N/A;  11:10 - moved to 8:30 - Ann to notify  . TONSILLECTOMY    . WRIST SURGERY      Family Psychiatric History: See below  Family History:  Family History  Problem Relation Age of Onset  . Hyperlipidemia Mother   . Heart disease Father   . Anxiety disorder Father   . Bipolar disorder Maternal Grandfather     Social History:  Social History   Socioeconomic History  . Marital status: Single    Spouse name: Not on file  . Number of children: Not on file  . Years of education: Not on file  . Highest education  level: Not on file  Occupational History  . Not on file  Tobacco Use  . Smoking status: Current Every Day Smoker    Packs/day: 0.50    Types: Cigarettes, E-cigarettes  . Smokeless tobacco: Former Systems developer  . Tobacco comment: Per pt he uses E-cigarettes  Substance and Sexual Activity  . Alcohol use: No    Alcohol/week: 0.0 standard drinks    Comment: rarely  . Drug use: No    Comment: 1995-2000 PER PT HE DID A LOT OF COCAINE AND PILLS AND DRANK A LOT  . Sexual activity: Yes    Partners: Male    Birth control/protection: Condom  Other Topics Concern  . Not on file  Social History Narrative  . Not on file   Social Determinants of Health    Financial Resource Strain:   . Difficulty of Paying Living Expenses:   Food Insecurity:   . Worried About Charity fundraiser in the Last Year:   . Arboriculturist in the Last Year:   Transportation Needs:   . Film/video editor (Medical):   Marland Kitchen Lack of Transportation (Non-Medical):   Physical Activity:   . Days of Exercise per Week:   . Minutes of Exercise per Session:   Stress:   . Feeling of Stress :   Social Connections:   . Frequency of Communication with Friends and Family:   . Frequency of Social Gatherings with Friends and Family:   . Attends Religious Services:   . Active Member of Clubs or Organizations:   . Attends Archivist Meetings:   Marland Kitchen Marital Status:     Allergies:  Allergies  Allergen Reactions  . Penicillins Itching and Rash    Metabolic Disorder Labs: No results found for: HGBA1C, MPG No results found for: PROLACTIN No results found for: CHOL, TRIG, HDL, CHOLHDL, VLDL, LDLCALC No results found for: TSH  Therapeutic Level Labs: Lab Results  Component Value Date   LITHIUM 1.10 10/15/2014   No results found for: VALPROATE No components found for:  CBMZ  Current Medications: Current Outpatient Medications  Medication Sig Dispense Refill  . acyclovir (ZOVIRAX) 400 MG tablet Take 1 tablet (400 mg total) by mouth 3 (three) times daily. 60 tablet 10  . Adalimumab (HUMIRA PEN) 40 MG/0.8ML PNKT     . alprazolam (XANAX) 2 MG tablet Take 1 tablet (2 mg total) by mouth in the morning, at noon, in the evening, and at bedtime. 120 tablet 2  . baclofen (LIORESAL) 10 MG tablet Take 10 mg by mouth 2 (two) times daily as needed.    . cycloSPORINE (RESTASIS) 0.05 % ophthalmic emulsion Place 1 drop into both eyes 2 (two) times daily.    Marland Kitchen gabapentin (NEURONTIN) 300 MG capsule Take 1 capsule (300 mg total) by mouth 3 (three) times daily. 90 capsule 2  . lamoTRIgine (LAMICTAL) 200 MG tablet Take 1 tablet (200 mg total) by mouth 2 (two) times daily. 30  tablet 2  . loratadine (CLARITIN) 10 MG tablet Take 10 mg by mouth daily.    Marland Kitchen lurasidone (LATUDA) 80 MG TABS tablet Take 1 tablet (80 mg total) by mouth daily. 30 tablet 2  . omeprazole (PRILOSEC) 20 MG capsule Take 20 mg by mouth daily.    . primidone (MYSOLINE) 50 MG tablet Take 100 mg by mouth at bedtime.    . Sennosides (SENOKOT PO) Take by mouth as needed.    Marland Kitchen tenofovir (VIREAD) 300 MG tablet Take 300 mg  by mouth daily.    . traZODone (DESYREL) 100 MG tablet Take one or two at bedtime 60 tablet 2  . vortioxetine HBr (TRINTELLIX) 20 MG TABS tablet Take 1 tablet (20 mg total) by mouth daily. 30 tablet 2  . Wheat Dextrin (BENEFIBER DRINK MIX) PACK Take 4 g by mouth at bedtime. (Patient taking differently: Take 4 g by mouth as needed. )     No current facility-administered medications for this visit.     Musculoskeletal: Strength & Muscle Tone: within normal limits Gait & Station: normal Patient leans: N/A  Psychiatric Specialty Exam: Review of Systems  Neurological: Positive for tremors.  All other systems reviewed and are negative.   There were no vitals taken for this visit.There is no height or weight on file to calculate BMI.  General Appearance: Casual, Neat and Well Groomed  Eye Contact:  Good  Speech:  Clear and Coherent  Volume:  Normal  Mood:  Euthymic  Affect:  Appropriate and Congruent  Thought Process:  Goal Directed  Orientation:  Full (Time, Place, and Person)  Thought Content: WDL   Suicidal Thoughts:  No  Homicidal Thoughts:  No  Memory:  Immediate;   Good Recent;   Good Remote;   Good  Judgement:  Good  Insight:  Good  Psychomotor Activity:  Normal  Concentration:  Concentration: Good and Attention Span: Good  Recall:  Good  Fund of Knowledge: Good  Language: Good  Akathisia:  No  Handed:  Right  AIMS (if indicated): not done  Assets:  Communication Skills Desire for Improvement Resilience Social Support Talents/Skills  ADL's:  Intact   Cognition: WNL  Sleep:  Good   Screenings: PHQ2-9     Clinical Support from 01/07/2014 in [place]  PHQ-2 Total Score 0       Assessment and Plan: This patient is a 49 year old male with a history of bipolar disorder. He is back on the Lamictal 200 mg twice daily he is doing better.  He will continue this as well as lurasidone 80 mg daily for mood stabilization, Trintellix 20 mg daily for depression, gabapentin 300 mg 3 times daily for anxiety and Xanax 2 mg 4 times daily for anxiety.  He will return to see me in 2 months   [name], MD 11/27/2019, 9:33 AM

## 2019-11-28 ENCOUNTER — Other Ambulatory Visit: Payer: Self-pay

## 2019-11-28 ENCOUNTER — Ambulatory Visit (INDEPENDENT_AMBULATORY_CARE_PROVIDER_SITE_OTHER): Payer: Medicare Other | Admitting: Psychiatry

## 2019-11-28 ENCOUNTER — Encounter (HOSPITAL_COMMUNITY): Payer: Self-pay | Admitting: Psychiatry

## 2019-11-28 DIAGNOSIS — F3130 Bipolar disorder, current episode depressed, mild or moderate severity, unspecified: Secondary | ICD-10-CM | POA: Diagnosis not present
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
## 2019-12-13 ENCOUNTER — Other Ambulatory Visit: Payer: Self-pay

## 2019-12-13 DIAGNOSIS — L400 Psoriasis vulgaris: Secondary | ICD-10-CM

## 2019-12-13 DIAGNOSIS — Z79899 Other long term (current) drug therapy: Secondary | ICD-10-CM

## 2019-12-13 MED ORDER — HUMIRA PEN 40 MG/0.8ML ~~LOC~~ PNKT: 1.0000 | PEN_INJECTOR | Freq: Once | SUBCUTANEOUS | 1 refills | Status: DC

## 2019-12-13 NOTE — Telephone Encounter (Signed)
Ok to refill Humira 40mg  Inject 40 mg pen subcutaneous every 2 weeks (x2) must have TB DONE BY 03/05/20 for any further refills per K Sheffield. Lab ordered and will be ready when the patient goes to [facility] on [address]

## 2019-12-19 ENCOUNTER — Ambulatory Visit (HOSPITAL_COMMUNITY): Payer: Medicare Other | Admitting: Psychiatry

## 2020-01-02 ENCOUNTER — Other Ambulatory Visit: Payer: Self-pay

## 2020-01-02 ENCOUNTER — Ambulatory Visit (INDEPENDENT_AMBULATORY_CARE_PROVIDER_SITE_OTHER): Payer: Medicare Other | Admitting: Psychiatry

## 2020-01-02 DIAGNOSIS — F3130 Bipolar disorder, current episode depressed, mild or moderate severity, unspecified: Secondary | ICD-10-CM | POA: Diagnosis not present
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
## 2020-01-16 ENCOUNTER — Ambulatory Visit (HOSPITAL_COMMUNITY): Payer: Medicare Other | Admitting: Psychiatry

## 2020-01-16 ENCOUNTER — Other Ambulatory Visit: Payer: Self-pay

## 2020-01-16 ENCOUNTER — Telehealth (HOSPITAL_COMMUNITY): Payer: Self-pay | Admitting: Psychiatry
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
## 2020-01-24 ENCOUNTER — Other Ambulatory Visit (HOSPITAL_COMMUNITY): Payer: Self-pay | Admitting: Psychiatry

## 2020-01-28 ENCOUNTER — Encounter (HOSPITAL_COMMUNITY): Payer: Self-pay | Admitting: Psychiatry

## 2020-01-28 ENCOUNTER — Telehealth (INDEPENDENT_AMBULATORY_CARE_PROVIDER_SITE_OTHER): Payer: Medicare Other | Admitting: Psychiatry

## 2020-01-28 ENCOUNTER — Other Ambulatory Visit: Payer: Self-pay

## 2020-01-28 DIAGNOSIS — F3130 Bipolar disorder, current episode depressed, mild or moderate severity, unspecified: Secondary | ICD-10-CM | POA: Diagnosis not present

## 2020-01-28 MED ORDER — TRAZODONE HCL 100 MG PO TABS
ORAL_TABLET | ORAL | 2 refills | Status: DC
Start: 2020-01-28 — End: 2020-03-23

## 2020-01-28 MED ORDER — VORTIOXETINE HBR 20 MG PO TABS
20.0000 mg | ORAL_TABLET | Freq: Every day | ORAL | 2 refills | Status: DC
Start: 2020-01-28 — End: 2020-03-23

## 2020-01-28 MED ORDER — GABAPENTIN 300 MG PO CAPS
300.0000 mg | ORAL_CAPSULE | Freq: Three times a day (TID) | ORAL | 2 refills | Status: DC
Start: 2020-01-28 — End: 2020-03-23

## 2020-01-28 MED ORDER — ALPRAZOLAM 2 MG PO TABS
2.0000 mg | ORAL_TABLET | Freq: Four times a day (QID) | ORAL | 2 refills | Status: DC
Start: 2020-01-28 — End: 2020-03-23

## 2020-01-28 MED ORDER — LAMOTRIGINE 200 MG PO TABS
ORAL_TABLET | ORAL | 2 refills | Status: DC
Start: 2020-01-28 — End: 2020-03-23

## 2020-01-28 MED ORDER — LURASIDONE HCL 80 MG PO TABS
80.0000 mg | ORAL_TABLET | Freq: Every day | ORAL | 2 refills | Status: DC
Start: 2020-01-28 — End: 2020-02-25

## 2020-01-28 NOTE — Progress Notes (Signed)
Virtual Visit via Telephone Note  I connected with Roger Duran on 01/28/20 at  1:00 PM EDT by telephone and verified that I am speaking with the correct person using two identifiers.   I discussed the limitations, risks, security and privacy concerns of performing an evaluation and management service by telephone and the availability of in person appointments. I also discussed with the patient that there may be a patient responsible charge related to this service. The patient expressed understanding and agreed to proceed.    I discussed the assessment and treatment plan with the patient. The patient was provided an opportunity to ask questions and all were answered. The patient agreed with the plan and demonstrated an understanding of the instructions.   The patient was advised to call back or seek an in-person evaluation if the symptoms worsen or if the condition fails to improve as anticipated.  I provided 15 minutes of non-face-to-face time during this encounter. Location: Provider office, patient home  Roger Spiller, MD  [facility] MD/PA/NP OP Progress Note  01/28/2020 1:20 PM Roger Duran  MRN:  [id number]  Chief Complaint:  Chief Complaint    Depression; Anxiety; Manic Behavior; Follow-up     HPI: This patient is a 49 year old single [place] male who lives with his grandmother and a roommate and [place]. He is gay but is not currently in a relationship. He is on disability for bipolar disorder.  The patient returns for follow-up after 2 months. He states overall he has been doing well. He still having a tremor and his primary doctor added Inderal 80 mg daily to the Mys[place] but he is still having the tremor. He is slated to see a neurologist at [facility] next month. He is having some anxiety particularly over money. He states that his roommate also a lot of money. The roommate will pay it back and then back to have it given back to him again because he is a gambling  addiction. I explained the patient that this was not not helping either him or his roommate any need to set limits on the borrowing. He agrees.  Overall his mood has been stable and his anxiety is under good control he is sleeping well and his energy is fairly good. He denies thoughts of self-harm suicidal ideation agitation or racing thoughts Visit Diagnosis:    ICD-10-CM   1. Bipolar I disorder, most recent episode depressed ([place])  F31.30     Past Psychiatric History: Hospitalization in 2009 after suicide attempt  Past Medical History:  Past Medical History:  Diagnosis Date  . Anxiety   . Bipolar 1 disorder ([place])   . Depression   . Mania ([place])   . Psoriasis     Past Surgical History:  Procedure Laterality Date  . APPENDECTOMY    . back procedures    . COLONOSCOPY N/A 11/14/2014   Procedure: COLONOSCOPY;  Surgeon: Rogene Houston, MD;  Location: [facility] SUITE;  Service: Endoscopy;  Laterality: N/A;  11:10 - moved to 8:30 - Ann to notify  . TONSILLECTOMY    . WRIST SURGERY      Family Psychiatric History: see below  Family History:  Family History  Problem Relation Age of Onset  . Hyperlipidemia Mother   . Heart disease Father   . Anxiety disorder Father   . Bipolar disorder Maternal Grandfather     Social History:  Social History   Socioeconomic History  . Marital status: Single    Spouse  name: Not on file  . Number of children: Not on file  . Years of education: Not on file  . Highest education level: Not on file  Occupational History  . Not on file  Tobacco Use  . Smoking status: Former Smoker    Types: Cigarettes, E-cigarettes    Quit date: 11/21/2019    Years since quitting: 0.1  . Smokeless tobacco: Former Network engineer  . Vaping Use: Never assessed  Substance and Sexual Activity  . Alcohol use: No    Alcohol/week: 0.0 standard drinks    Comment: rarely  . Drug use: No    Comment: 1995-2000 PER PT HE DID A LOT OF COCAINE AND PILLS AND DRANK A LOT   . Sexual activity: Not Currently    Partners: Male    Birth control/protection: Condom  Other Topics Concern  . Not on file  Social History Narrative  . Not on file   Social Determinants of Health   Financial Resource Strain:   . Difficulty of Paying Living Expenses: Not on file  Food Insecurity:   . Worried About Charity fundraiser in the Last Year: Not on file  . Ran Out of Food in the Last Year: Not on file  Transportation Needs:   . Lack of Transportation (Medical): Not on file  . Lack of Transportation (Non-Medical): Not on file  Physical Activity:   . Days of Exercise per Week: Not on file  . Minutes of Exercise per Session: Not on file  Stress:   . Feeling of Stress : Not on file  Social Connections:   . Frequency of Communication with Friends and Family: Not on file  . Frequency of Social Gatherings with Friends and Family: Not on file  . Attends Religious Services: Not on file  . Active Member of Clubs or Organizations: Not on file  . Attends Archivist Meetings: Not on file  . Marital Status: Not on file    Allergies:  Allergies  Allergen Reactions  . Penicillins Itching and Rash    Metabolic Disorder Labs: No results found for: HGBA1C, MPG No results found for: PROLACTIN No results found for: CHOL, TRIG, HDL, CHOLHDL, VLDL, LDLCALC No results found for: TSH  Therapeutic Level Labs: Lab Results  Component Value Date   LITHIUM 1.10 10/15/2014   No results found for: VALPROATE No components found for:  CBMZ  Current Medications: Current Outpatient Medications  Medication Sig Dispense Refill  . acyclovir (ZOVIRAX) 400 MG tablet Take 1 tablet (400 mg total) by mouth 3 (three) times daily. 60 tablet 10  . Adalimumab (HUMIRA PEN) 40 MG/0.8ML PNKT Inject 1 pen into the skin once for 1 dose. Inject 40 mg pen subcutaneous every 2 weeks 2 each 1  . alprazolam (XANAX) 2 MG tablet Take 1 tablet (2 mg total) by mouth in the morning, at noon, in the  evening, and at bedtime. 120 tablet 2  . baclofen (LIORESAL) 10 MG tablet Take 10 mg by mouth 2 (two) times daily as needed.    . cycloSPORINE (RESTASIS) 0.05 % ophthalmic emulsion Place 1 drop into both eyes 2 (two) times daily.    Marland Kitchen gabapentin (NEURONTIN) 300 MG capsule Take 1 capsule (300 mg total) by mouth 3 (three) times daily. 90 capsule 2  . lamoTRIgine (LAMICTAL) 200 MG tablet TAKE  (1)  TABLET TWICE A DAY. 60 tablet 2  . loratadine (CLARITIN) 10 MG tablet Take 10 mg by mouth daily.    Marland Kitchen  lurasidone (LATUDA) 80 MG TABS tablet Take 1 tablet (80 mg total) by mouth daily. 30 tablet 2  . omeprazole (PRILOSEC) 20 MG capsule Take 20 mg by mouth daily.    . primidone (MYSOLINE) 50 MG tablet Take 100 mg by mouth at bedtime.    . propranolol ER (INDERAL LA) 80 MG 24 hr capsule Take 80 mg by mouth daily.    . Sennosides (SENOKOT PO) Take by mouth as needed.    Marland Kitchen tenofovir (VIREAD) 300 MG tablet Take 300 mg by mouth daily. (Patient not taking: Reported on 11/28/2019)    . traZODone (DESYREL) 100 MG tablet Take one or two at bedtime 60 tablet 2  . vortioxetine HBr (TRINTELLIX) 20 MG TABS tablet Take 1 tablet (20 mg total) by mouth daily. 30 tablet 2  . Wheat Dextrin (BENEFIBER DRINK MIX) PACK Take 4 g by mouth at bedtime. (Patient taking differently: Take 4 g by mouth as needed. )     No current facility-administered medications for this visit.     Musculoskeletal: Strength & Muscle Tone: within normal limits Gait & Station: normal Patient leans: N/A  Psychiatric Specialty Exam: Review of Systems  Neurological: Positive for tremors.  All other systems reviewed and are negative.   There were no vitals taken for this visit.There is no height or weight on file to calculate BMI.  General Appearance: Casual and Fairly Groomed  Eye Contact:  Good  Speech:  Clear and Coherent  Volume:  Normal  Mood:  Euthymic  Affect:  Appropriate and Congruent  Thought Process:  Goal Directed  Orientation:   Full (Time, Place, and Person)  Thought Content: Logical   Suicidal Thoughts:  No  Homicidal Thoughts:  No  Memory:  Immediate;   Good Recent;   Good Remote;   Good  Judgement:  Fair  Insight:  Fair  Psychomotor Activity:  Tremor  Concentration:  Concentration: Good and Attention Span: Good  Recall:  Good  Fund of Knowledge: Good  Language: Good  Akathisia:  No  Handed:  Right  AIMS (if indicated): not done  Assets:  Communication Skills Desire for Improvement Resilience Social Support Talents/Skills  ADL's:  Intact  Cognition: WNL  Sleep:  Good   Screenings: PHQ2-9     Clinical Support from 01/07/2014 in [place]  PHQ-2 Total Score 0       Assessment and Plan: This patient is a 49 year old male with a history of bipolar disorder. He is doing well from a psychiatric standpoint but still has intention tremor. He is going to be seeing a neurologist. We have tried cutting back on his psychotropic medications but he becomes much more anxious and depressed. For now he will continue Lamictal 200 mg twice daily for mood stabilization, lurasidone 80 mg daily for mood stabilization, Trintellix 20 mg daily for depression, gabapentin 300 mg three times daily for anxiety and Xanax 2 mg four times daily for anxiety. He will return to see me in 2 months   Roger Spiller, MD 01/28/2020, 1:20 PM

## 2020-01-30 ENCOUNTER — Other Ambulatory Visit: Payer: Self-pay

## 2020-01-30 ENCOUNTER — Ambulatory Visit (INDEPENDENT_AMBULATORY_CARE_PROVIDER_SITE_OTHER): Payer: Medicare Other | Admitting: Psychiatry

## 2020-01-30 DIAGNOSIS — F3130 Bipolar disorder, current episode depressed, mild or moderate severity, unspecified: Secondary | ICD-10-CM

## 2020-01-30 NOTE — Progress Notes (Signed)
Virtual Visit via Video Note  I connected with Roger Duran on 01/30/20 at 1;10 PM EDT  by a video enabled telemedicine application and verified that I am speaking with the correct person using two identifiers.   I discussed the limitations of evaluation and management by telemedicine and the availability of in person appointments. The patient expressed understanding and agreed to proceed.    I provided 36  minutes of non-face-to-face time during this encounter.   [name], LCSW    THERAPIST PROGRESS NOTE   Location: Patient - Home/ Provider - [place] office   Session Time: Thursday 01/30/2020 1:10 PM -1:46 PM    Participation Level: Active  Behavioral Response: Casual/ AlertAnxious and Depressed  Type of Therapy: Individual Therapy  Treatment Goals addressed: Establish rapport /Patient wants to improve skills to cope with everyday stress/anxiety, learn how to say no to others and manage money/has ability to handle effectively the full variety of life's anxieties, develop ability to recognize, accept, and cope with feelings of depression  Interventions: CBT and Supportive  Summary: Roger Duran is a 49 y.o. male who  is referrred for services by psychiatrist Dr. Harrington Challenger for continuity of care. Patient has history of bipolar disorder and was seeing S. Schneidmiller for therapy. She now is out on medical leave. He reports one psychiatric hospitalization due to suicide attempt in 2009. He received outpatient treatment at [facility] He eventually saw Ms. Schenidmiller and psychiatrist Dr. Harrington Challenger.  Patient reports he tends to have depressive and missed episodes.  He also reports constant anxiety along with paranoia and delusional thinking at times.  He reports being depressed most of the time and having to force himself out of bed.  Patient last was seen via virtual visit about a month ago.  He reports less stressed, less depressed, and less anxious as his roommate  recently repaid him some of the money he owes.  He continues to express frustration with a roommate as roommate asked him again for some money last night.  However, patient reports saying no.  He reports roommate became angry and pouted.  However patient reports being able to maintain his limits.  He reports roommate has a pattern of asking for money to go gambling when he starts drinking.  Patient reports sometimes giving into her roommate as he just wants him to be quiet.  Patient is looking forward to going on a 4-day trip to [place] with one of his friends.  Patient reports he has been practicing deep breathing and says it has been helpful.l he continues to have frequent worry of thoughts about the roommate as well as the future.  Suicidal/Homicidal: Nowithout intent/plan  Therapist Response:reviewed symptoms, praised and reinforced patient's efforts to practice deep breathing, discussed effects, praised and reinforced patient's efforts to set maintain limits with his friends/roommate, discussed effects, assisted patient examine his pattern of interaction with his roommate, assisted patient identify strategies/behaviors to use to disengage when roommates starts to pout, assisted patient identify thoughts patterns that inhibit and those that promote effective assertion, will send patient handout (basic personal rights) in preparation for next session.  Plan: Return again in 2 weeks.  Diagnosis: Axis I: Bipolar, Depressed    Axis II: No diagnosis    [name], LCSW 01/30/2020

## 2020-02-04 ENCOUNTER — Telehealth (HOSPITAL_COMMUNITY): Payer: Self-pay | Admitting: Psychiatry

## 2020-02-04 NOTE — Telephone Encounter (Signed)
Called to schedule f/u appt, unable to leave message

## 2020-02-10 ENCOUNTER — Other Ambulatory Visit: Payer: Self-pay | Admitting: *Deleted

## 2020-02-10 MED ORDER — HUMIRA (2 PEN) 40 MG/0.4ML ~~LOC~~ AJKT: 40.0000 mg | AUTO-INJECTOR | SUBCUTANEOUS | 0 refills | Status: DC

## 2020-02-13 ENCOUNTER — Ambulatory Visit (INDEPENDENT_AMBULATORY_CARE_PROVIDER_SITE_OTHER): Payer: Medicare Other | Admitting: Psychiatry

## 2020-02-13 ENCOUNTER — Other Ambulatory Visit: Payer: Self-pay

## 2020-02-13 DIAGNOSIS — F3130 Bipolar disorder, current episode depressed, mild or moderate severity, unspecified: Secondary | ICD-10-CM | POA: Diagnosis not present

## 2020-02-13 NOTE — Progress Notes (Signed)
Virtual Visit via Video Note  I connected with Roger Duran on 02/13/20 at 1:10 PM EDT  by a video enabled telemedicine application and verified that I am speaking with the correct person using two identifiers.   I discussed the limitations of evaluation and management by telemedicine and the availability of in person appointments. The patient expressed understanding and agreed to proceed.  I provided 50 minutes of non-face-to-face time during this encounter.   Roger Smoker, LCSW    THERAPIST PROGRESS NOTE   Location: Patient - Home/ Provider - [place] office   Session Time: Thursday 02/13/2020  1:10 PM -  2:00 PM    Participation Level: Active  Behavioral Response: Casual/ AlertAnxious and Depressed  Type of Therapy: Individual Therapy  Treatment Goals addressed: Establish rapport /Patient wants to improve skills to cope with everyday stress/anxiety, learn how to say no to others and manage money/has ability to handle effectively the full variety of life's anxieties, develop ability to recognize, accept, and cope with feelings of depression  Interventions: CBT and Supportive  Summary: Roger Duran is a 49 y.o. male who  is referrred for services by psychiatrist Dr. Harrington Challenger for continuity of care. Patient has history of bipolar disorder and was seeing S. Schneidmiller for therapy. She now is out on medical leave. He reports one psychiatric hospitalization due to suicide attempt in 2009. He received outpatient treatment at [facility]. He eventually saw Ms. Schenidmiller and psychiatrist Dr. Harrington Challenger.  Patient reports he tends to have depressive and missed episodes.  He also reports constant anxiety along with paranoia and delusional thinking at times.  He reports being depressed most of the time and having to force himself out of bed.  Patient last was seen via virtual visit about 2 weeks ago.  He reports enjoying a recent trip with his friend and being very relaxed.   However, he reports becoming more stressed when he returned home as his roommate had taken money out of his grandmother's account.  He says the roommate has returned the money and patient has taken security measures to make certain roommate no longer has access to the account.  Patient expresses frustration and disappointment.  He also is concerned about his mother's possible reaction to roommate's behavior once she finds out about it.  Patient reports he has continued to try to set/maintain limits and has not loaned roommate any more money.  However he reports remaining very anxious and walking around all eggshells due to the roommate's drinking and gambling problems.  He still expresses frustration the roommate has not repaid him all the money he owes to patient.  Patient reports he has been reading basic personal rights and reports this has been helped him realize rights that he did not know he had.    Suicidal/Homicidal: Nowithout intent/plan  Therapist Response:reviewed symptoms, praised and reinforced patient's efforts to try to set and maintain limits with roommate, discussed stressors, facilitated expression of feelings, validated feelings, discussed basic personal rights to help patient identify statements to promote effective assertion, discussed those statements that are applicable in his current situation, discussed rationale for and assisted patient practice imagery as a relaxation technique, will send handout, developed plan with patient to practice a relaxation technique daily, also will send patient handout on assertive communication in preparation for next session  Plan: Return again in 2 weeks.  Diagnosis: Axis I: Bipolar, Depressed    Axis II: No diagnosis    Roger Smoker, LCSW 02/13/2020

## 2020-02-25 ENCOUNTER — Telehealth (HOSPITAL_COMMUNITY): Payer: Self-pay | Admitting: *Deleted

## 2020-02-25 ENCOUNTER — Other Ambulatory Visit (HOSPITAL_COMMUNITY): Payer: Self-pay | Admitting: Psychiatry

## 2020-02-25 MED ORDER — LATUDA 120 MG PO TABS: 120.0000 mg | ORAL_TABLET | Freq: Every day | ORAL | 2 refills | Status: DC

## 2020-02-25 NOTE — Telephone Encounter (Signed)
Pt called stating that the neurologist had increased his Primidone to 250 mg. Per pt he is having more frequent manic episodes. Per pt he is needing Dr. Harrington Challenger advise. Per pt his [name] does well but afraid that the Primidone is doing something to it. Per pt he would like for provider to call him so they can discuss this. [phone].

## 2020-02-25 NOTE — Telephone Encounter (Signed)
Spoke to pt, will increase latuda to `120 mg, but he also needs to inform neurologist about side effects

## 2020-02-26 NOTE — Telephone Encounter (Signed)
noted 

## 2020-02-27 ENCOUNTER — Ambulatory Visit (HOSPITAL_COMMUNITY): Payer: Medicare Other | Admitting: Psychiatry

## 2020-02-27 ENCOUNTER — Telehealth (HOSPITAL_COMMUNITY): Payer: Self-pay | Admitting: *Deleted

## 2020-02-27 ENCOUNTER — Other Ambulatory Visit: Payer: Self-pay

## 2020-02-27 ENCOUNTER — Telehealth (HOSPITAL_COMMUNITY): Payer: Self-pay | Admitting: Psychiatry

## 2020-02-27 NOTE — Telephone Encounter (Signed)
Pt called stating he was sick and he went back to bed this morning and did not mean to miss provider appt. Per pt he apologize. Per pt he just dont miss his appts like that he was just was not feeling well.

## 2020-02-27 NOTE — Telephone Encounter (Signed)
Therapist attempted to contact patient twice via text through care agility platform for scheduled appointment, no response.  Therapist called patient, left message indicating attempt, and requesting patient call office.

## 2020-03-02 ENCOUNTER — Telehealth: Payer: Self-pay | Admitting: Dermatology

## 2020-03-02 NOTE — Telephone Encounter (Signed)
Patient is calling to say that he went for the TB test today.  (Chart # [id number])

## 2020-03-04 LAB — QUANTIFERON-TB GOLD PLUS
Mitogen-NIL: >10 IU/mL
NIL: 0.05 IU/mL
QuantiFERON-TB Gold Plus: NEGATIVE
TB1-NIL: 0 IU/mL
TB2-NIL: 0.01 IU/mL

## 2020-03-05 ENCOUNTER — Other Ambulatory Visit: Payer: Self-pay | Admitting: Dermatology

## 2020-03-05 DIAGNOSIS — L400 Psoriasis vulgaris: Secondary | ICD-10-CM

## 2020-03-05 MED ORDER — HUMIRA (2 PEN) 40 MG/0.4ML ~~LOC~~ AJKT: 40.0000 mg | AUTO-INJECTOR | SUBCUTANEOUS | 0 refills | Status: DC

## 2020-03-05 MED ORDER — HUMIRA (2 PEN) 40 MG/0.4ML ~~LOC~~ AJKT: 40.0000 mg | AUTO-INJECTOR | SUBCUTANEOUS | 4 refills | Status: DC

## 2020-03-05 NOTE — Telephone Encounter (Signed)
Phone call to patient to let him know that we did receive his TB Gold and to schedule an appointment with [facility].  Appointment scheduled.

## 2020-03-05 NOTE — Telephone Encounter (Signed)
Patient is calling to make sure that we received the TB test results.

## 2020-03-12 ENCOUNTER — Ambulatory Visit (INDEPENDENT_AMBULATORY_CARE_PROVIDER_SITE_OTHER): Payer: Medicare Other | Admitting: Psychiatry

## 2020-03-12 ENCOUNTER — Other Ambulatory Visit: Payer: Self-pay

## 2020-03-12 DIAGNOSIS — F3130 Bipolar disorder, current episode depressed, mild or moderate severity, unspecified: Secondary | ICD-10-CM

## 2020-03-12 NOTE — Progress Notes (Signed)
Virtual Visit via Video Note  I connected with [name] on 03/12/20 at 1:10 PM EST by a video enabled telemedicine application and verified that I am speaking with the correct person using two identifiers.  Location: Patient: Home Provider: [place] office    I discussed the limitations of evaluation and management by telemedicine and the availability of in person appointments. The patient expressed understanding and agreed to proceed.   I provided 47  minutes of non-face-to-face time during this encounter.   [name], LCSW    THERAPIST PROGRESS NOTE   Location: Patient - Home/ Provider - [place] office   Session Time: Thursday 03/12/2020 1:10 PM - 1:57 PM   Participation Level: Active  Behavioral Response: Casual/ AlertAnxious and Depressed  Type of Therapy: Individual Therapy  Treatment Goals addressed: Establish rapport /Patient wants to improve skills to cope with everyday stress/anxiety, learn how to say no to others and manage money/has ability to handle effectively the full variety of life's anxieties, develop ability to recognize, accept, and cope with feelings of depression  Interventions: CBT and Supportive  Summary: Roger Duran is a 49 y.o. male who  is referrred for services by psychiatrist Dr. Harrington Challenger for continuity of care. Patient has history of bipolar disorder and was seeing S. Schneidmiller for therapy. She now is out on medical leave. He reports one psychiatric hospitalization due to suicide attempt in 2009. He received outpatient treatment at [facility]. He eventually saw Ms. Schenidmiller and psychiatrist Dr. Harrington Challenger.  Patient reports he tends to have depressive and missed episodes.  He also reports constant anxiety along with paranoia and delusional thinking at times.  He reports being depressed most of the time and having to force himself out of bed.  Patient last was seen via virtual visit about a month ago.  He  reports having a reaction to medication that triggered manic episodes.  He has worked with psychiatrist Dr. Harrington Challenger and medication has been adjusted.  Patient reports manic episode has ceased but he now is feeling more depressed.  He reports poor motivation, decreased involvement in activities, negative/judgmental critical thoughts, decreased interest in personal self-care, and excessive worry.  He reports continued financial stress.  He also reports stress trying to take care of his grandmother especially when he is depressed.  He reports having to push himself and feeling guilty about this.  He reports passive suicidal thoughts of not wanting to be here but adamantly denies any plan or intent to harm self.  Patient reports he has been using imagery to try to cope.  Suicidal/Homicidal: Nowithout intent/plan  Therapist Response:reviewed symptoms, discussed stressors, facilitated expression of thoughts and feelings, validated feelings, discussed the role of behavioral activation in overcoming depression, discussed rationale for and provided instructions on how to use daily planning calendar, developed plan with patient to use daily planning calendar to increase behavioral activation, will send patient handouts (calendar, activity menu), assisted patient identify and address thoughts and processes that may inhibit implementation of plan, discussed self compassion praised, also explore possibility of patient talking with mother about planning predictable and consistent breaks for patient to get relief from caretaker responsibilities.   Plan: Return again in 2 weeks.  Diagnosis: Axis I: Bipolar, Depressed    Axis II: No diagnosis    [name], LCSW 03/12/2020

## 2020-03-18 ENCOUNTER — Telehealth: Payer: Self-pay | Admitting: Dermatology

## 2020-03-18 NOTE — Telephone Encounter (Signed)
Sunitha at [facility] called to say that patient contacted Iantha Fallen about a question he has about Humira.  Per [place] patient took Humira about 1 week ago and then mistakenly again this week.  Alene Mires stated that patient wants to know if he should take his regular dose next week or push it to two weeks.

## 2020-03-19 NOTE — Telephone Encounter (Signed)
Phone call to patient with Dr. Tafeen's recommendations.  Patient aware. 

## 2020-03-19 NOTE — Telephone Encounter (Signed)
Please let patient know that there will be no side effect from his extra dose since this is the usual dosing when people have arthritis.  It would be perfectly okay to resume his every 2-week dosing per his preference either 1 week or 2 weeks after his last dose.

## 2020-03-23 ENCOUNTER — Other Ambulatory Visit: Payer: Self-pay

## 2020-03-23 ENCOUNTER — Encounter (HOSPITAL_COMMUNITY): Payer: Self-pay | Admitting: Psychiatry

## 2020-03-23 ENCOUNTER — Telehealth (INDEPENDENT_AMBULATORY_CARE_PROVIDER_SITE_OTHER): Payer: Medicare Other | Admitting: Psychiatry

## 2020-03-23 DIAGNOSIS — F3130 Bipolar disorder, current episode depressed, mild or moderate severity, unspecified: Secondary | ICD-10-CM

## 2020-03-23 MED ORDER — VORTIOXETINE HBR 20 MG PO TABS
20.0000 mg | ORAL_TABLET | Freq: Every day | ORAL | 2 refills | Status: DC
Start: 2020-03-23 — End: 2020-06-04

## 2020-03-23 MED ORDER — GABAPENTIN 300 MG PO CAPS
300.0000 mg | ORAL_CAPSULE | Freq: Three times a day (TID) | ORAL | 2 refills | Status: DC
Start: 2020-03-23 — End: 2020-05-27

## 2020-03-23 MED ORDER — LAMOTRIGINE 200 MG PO TABS
ORAL_TABLET | ORAL | 2 refills | Status: DC
Start: 2020-03-23 — End: 2020-05-27

## 2020-03-23 MED ORDER — TRAZODONE HCL 100 MG PO TABS
ORAL_TABLET | ORAL | 2 refills | Status: DC
Start: 2020-03-23 — End: 2020-05-27

## 2020-03-23 MED ORDER — LATUDA 120 MG PO TABS
120.0000 mg | ORAL_TABLET | Freq: Every day | ORAL | 2 refills | Status: DC
Start: 2020-03-23 — End: 2020-05-27

## 2020-03-23 MED ORDER — ALPRAZOLAM 2 MG PO TABS
2.0000 mg | ORAL_TABLET | Freq: Four times a day (QID) | ORAL | 2 refills | Status: DC
Start: 2020-03-23 — End: 2020-05-27

## 2020-03-23 NOTE — Progress Notes (Signed)
Virtual Visit via Video Note  I connected with Roger Duran on 03/23/20 at  1:20 PM EST by a video enabled telemedicine application and verified that I am speaking with the correct person using two identifiers.  Location: Patient: home Provider: home   I discussed the limitations of evaluation and management by telemedicine and the availability of in person appointments. The patient expressed understanding and agreed to proceed.    I discussed the assessment and treatment plan with the patient. The patient was provided an opportunity to ask questions and all were answered. The patient agreed with the plan and demonstrated an understanding of the instructions.   The patient was advised to call back or seek an in-person evaluation if the symptoms worsen or if the condition fails to improve as anticipated.  I provided 15 minutes of non-face-to-face time during this encounter.   [name], MD  [facility] MD/PA/NP OP Progress Note  03/23/2020 1:43 PM Roger Duran  MRN:  [id number]  Chief Complaint:  Chief Complaint    Depression; Anxiety; Manic Behavior; Follow-up     HPI: This patient is a 50 year old single [place] male who lives with his grandmother and a roommate in [place]. He is gay but is not currently in a relationship. He is on disability for bipolar disorder.  Patient returns for follow-up after 2 months.  Since I last saw him he has had some Botox injections for tremor last week.  Initially it helped but now the tremor is coming back in both hands.  We did increase his Latuda a few weeks ago because his Mysoline had been increased and was bringing down the blood level of the Latuda.  He is on 120 mg now.  He is feeling better and his mood has been stable.  His sleep is somewhat variable and he may increase his trazodone to 150.  He is getting along better with his roommate and feels comfortable in his living situation.  He denies any severe depressive or manic symptoms severe  anxiety or thoughts of self-harm. Visit Diagnosis:    ICD-10-CM   1. Bipolar I disorder, most recent episode depressed ([place])  F31.30     Past Psychiatric History: Hospitalization in 2009 after suicide attempt  Past Medical History:  Past Medical History:  Diagnosis Date  . Anxiety   . Bipolar 1 disorder ([place])   . Depression   . Mania ([place])   . Psoriasis     Past Surgical History:  Procedure Laterality Date  . APPENDECTOMY    . back procedures    . COLONOSCOPY N/A 11/14/2014   Procedure: COLONOSCOPY;  Surgeon: Rogene Houston, MD;  Location: [facility];  Service: Endoscopy;  Laterality: N/A;  11:10 - moved to 8:30 - Ann to notify  . TONSILLECTOMY    . WRIST SURGERY      Family Psychiatric History: see below  Family History:  Family History  Problem Relation Age of Onset  . Hyperlipidemia Mother   . Heart disease Father   . Anxiety disorder Father   . Bipolar disorder Maternal Grandfather     Social History:  Social History   Socioeconomic History  . Marital status: Single    Spouse name: Not on file  . Number of children: Not on file  . Years of education: Not on file  . Highest education level: Not on file  Occupational History  . Not on file  Tobacco Use  . Smoking status: Former Smoker  Types: Cigarettes, E-cigarettes    Quit date: 11/21/2019    Years since quitting: 0.3  . Smokeless tobacco: Former Network engineer  . Vaping Use: Never assessed  Substance and Sexual Activity  . Alcohol use: No    Alcohol/week: 0.0 standard drinks    Comment: rarely  . Drug use: No    Comment: 1995-2000 PER PT HE DID A LOT OF COCAINE AND PILLS AND DRANK A LOT  . Sexual activity: Not Currently    Partners: Male    Birth control/protection: Condom  Other Topics Concern  . Not on file  Social History Narrative  . Not on file   Social Determinants of Health   Financial Resource Strain:   . Difficulty of Paying Living Expenses: Not on file  Food Insecurity:    . Worried About Charity fundraiser in the Last Year: Not on file  . Ran Out of Food in the Last Year: Not on file  Transportation Needs:   . Lack of Transportation (Medical): Not on file  . Lack of Transportation (Non-Medical): Not on file  Physical Activity:   . Days of Exercise per Week: Not on file  . Minutes of Exercise per Session: Not on file  Stress:   . Feeling of Stress : Not on file  Social Connections:   . Frequency of Communication with Friends and Family: Not on file  . Frequency of Social Gatherings with Friends and Family: Not on file  . Attends Religious Services: Not on file  . Active Member of Clubs or Organizations: Not on file  . Attends Archivist Meetings: Not on file  . Marital Status: Not on file    Allergies:  Allergies  Allergen Reactions  . Penicillins Itching and Rash    Metabolic Disorder Labs: No results found for: HGBA1C, MPG No results found for: PROLACTIN No results found for: CHOL, TRIG, HDL, CHOLHDL, VLDL, LDLCALC No results found for: TSH  Therapeutic Level Labs: Lab Results  Component Value Date   LITHIUM 1.10 10/15/2014   No results found for: VALPROATE No components found for:  CBMZ  Current Medications: Current Outpatient Medications  Medication Sig Dispense Refill  . propranolol (INNOPRAN XL) 80 MG 24 hr capsule Take by mouth.    Marland Kitchen acyclovir (ZOVIRAX) 400 MG tablet Take 1 tablet (400 mg total) by mouth 3 (three) times daily. 60 tablet 10  . Adalimumab (HUMIRA PEN) 40 MG/0.4ML PNKT Inject 40 mg into the skin every 14 (fourteen) days. For maintenance. 2 each 4  . alprazolam (XANAX) 2 MG tablet Take 1 tablet (2 mg total) by mouth in the morning, at noon, in the evening, and at bedtime. 120 tablet 2  . baclofen (LIORESAL) 10 MG tablet Take 10 mg by mouth 2 (two) times daily as needed.    . cycloSPORINE (RESTASIS) 0.05 % ophthalmic emulsion Place 1 drop into both eyes 2 (two) times daily.    Marland Kitchen gabapentin (NEURONTIN) 300  MG capsule Take 1 capsule (300 mg total) by mouth 3 (three) times daily. 90 capsule 2  . lamoTRIgine (LAMICTAL) 200 MG tablet TAKE  (1)  TABLET TWICE A DAY. 60 tablet 2  . loratadine (CLARITIN) 10 MG tablet Take 10 mg by mouth daily.    . Lurasidone HCl (LATUDA) 120 MG TABS Take 1 tablet (120 mg total) by mouth daily. 30 tablet 2  . omeprazole (PRILOSEC) 20 MG capsule Take 20 mg by mouth daily.    . primidone (MYSOLINE)  50 MG tablet Take 100 mg by mouth at bedtime.    . propranolol ER (INDERAL LA) 80 MG 24 hr capsule Take 80 mg by mouth daily.    . Sennosides (SENOKOT PO) Take by mouth as needed.    Marland Kitchen tenofovir (VIREAD) 300 MG tablet Take 300 mg by mouth daily. (Patient not taking: Reported on 11/28/2019)    . traZODone (DESYREL) 100 MG tablet Take one or two at bedtime 60 tablet 2  . vortioxetine HBr (TRINTELLIX) 20 MG TABS tablet Take 1 tablet (20 mg total) by mouth daily. 30 tablet 2  . Wheat Dextrin (BENEFIBER DRINK MIX) PACK Take 4 g by mouth at bedtime. (Patient taking differently: Take 4 g by mouth as needed. )     No current facility-administered medications for this visit.     Musculoskeletal: Strength & Muscle Tone: within normal limits Gait & Station: normal Patient leans: N/A  Psychiatric Specialty Exam: Review of Systems  Neurological: Positive for tremors.  All other systems reviewed and are negative.   There were no vitals taken for this visit.There is no height or weight on file to calculate BMI.  General Appearance: Casual and Fairly Groomed  Eye Contact:  Good  Speech:  Clear and Coherent  Volume:  Normal  Mood:  Euthymic  Affect:  Appropriate and Congruent  Thought Process:  Goal Directed  Orientation:  Full (Time, Place, and Person)  Thought Content: Rumination   Suicidal Thoughts:  No  Homicidal Thoughts:  No  Memory:  Immediate;   Good Recent;   Good Remote;   Good  Judgement:  Good  Insight:  Good and Fair  Psychomotor Activity:  Tremor   Concentration:  Concentration: Good and Attention Span: Good  Recall:  Good  Fund of Knowledge: Good  Language: Good  Akathisia:  No  Handed:  Right  AIMS (if indicated): not done  Assets:  Communication Skills Desire for Improvement  ADL's:  Intact  Cognition: WNL  Sleep:  Fair   Screenings: PHQ2-9     Clinical Support from 01/07/2014 in [place]  PHQ-2 Total Score 0       Assessment and Plan: This patient is a 49 year old male with a history of bipolar disorder he seems to be doing well and feels stable right now.  He will continue Lamictal 200 mg twice daily for mood stabilization, lurasidone 120 mg daily for mood stabilization, Trintellix 20 mg daily for depression, gabapentin 300 mg 3 times daily for anxiety and Xanax 2 mg 4 times daily for anxiety as well.  He will return to see me in 2 months   [name], MD 03/23/2020, 1:43 PM

## 2020-03-31 ENCOUNTER — Telehealth: Payer: Self-pay | Admitting: Dermatology

## 2020-03-31 NOTE — Telephone Encounter (Signed)
error 

## 2020-04-02 ENCOUNTER — Ambulatory Visit (INDEPENDENT_AMBULATORY_CARE_PROVIDER_SITE_OTHER): Payer: Medicare Other | Admitting: Psychiatry

## 2020-04-02 ENCOUNTER — Other Ambulatory Visit: Payer: Self-pay

## 2020-04-02 DIAGNOSIS — F3130 Bipolar disorder, current episode depressed, mild or moderate severity, unspecified: Secondary | ICD-10-CM

## 2020-04-02 NOTE — Progress Notes (Signed)
Virtual Visit via Video Note  I connected with Roger Duran on 04/02/20 at 2:07 PM EST  by a video enabled telemedicine application and verified that I am speaking with the correct person using two identifiers.  Location: Patient: Home Provider:  [place] office    I discussed the limitations of evaluation and management by telemedicine and the availability of in person appointments. The patient expressed understanding and agreed to proceed.   I provided 45 minutes of non-face-to-face time during this encounter.    [name], LCSW  THERAPIST PROGRESS NOTE   Location: Patient - Home/ Provider - [facility] [place] office   Session Time: Thursday 04/02/2020 2:07 PM - 2;52 PM   Participation Level: Active     Behavioral Response: Casual/ AlertAnxious and Depressed  Type of Therapy: Individual Therapy  Treatment Goals addressed: Patient wants to improve skills to cope with everyday stress/anxiety, learn how to say no to others and manage money/has ability to handle effectively the full variety of life's anxieties, develop ability to recognize, accept, and cope with feelings of depression  Interventions: CBT and Supportive  Summary: Roger Duran is a 48 y.o. male who  is referrred for services by psychiatrist Dr. Harrington Challenger for continuity of care. Patient has history of bipolar disorder and was seeing S. Schneidmiller for therapy. She now is out on medical leave. He reports one psychiatric hospitalization due to suicide attempt in 2009. He received outpatient treatment at [facility]. He eventually saw Ms. Schenidmiller and psychiatrist Dr. Harrington Challenger.  Patient reports he tends to have depressive and missed episodes.  He also reports constant anxiety along with paranoia and delusional thinking at times.  He reports being depressed most of the time and having to force himself out of bed.  Patient last was seen via virtual visit about 3 weeks ago.  He reports less  depressed mood and increased behavioral activation since last session.  He also reports decreased financial stress.  He continues to care for his grandmother and still sometimes reports dreading this but feeling better once he provides care to his grandmother.  He reports feeling a sense of accomplishment and being glad to be able to help her he still has not engaged in more activities regarding self-care reports some difficulty planning this.  He reports mother is supportive and will provide care for his grandmother when he asks.however, patient has not had a conversation with mother to try to schedule a consistent predictable time for this to happen.  Patient reports that he has decided to start attending the [facility] in January.  Suicidal/Homicidal: Nowithout intent/plan  Therapist Response:reviewed symptoms, pacing reinforced patient's increased behavioral activation, discussed effects, discussed stressors, facilitated expression of thoughts and feelings, validated feelings, discussed the importance of balance between responsibilities and pleasurable activities, assisted patient identify ways to try to achieve this balance through the use of daily planning, develop plan with patient to use activity menu in daily planning/bring calendar to next session, also discussed patient having conversation with mother about predictable and consistent breaks  Plan: Return again in 2 weeks.  Diagnosis: Axis I: Bipolar, Depressed    Axis II: No diagnosis    [name], LCSW 04/02/2020

## 2020-04-15 ENCOUNTER — Telehealth (HOSPITAL_COMMUNITY): Payer: Self-pay | Admitting: Psychiatry

## 2020-04-15 ENCOUNTER — Other Ambulatory Visit: Payer: Self-pay

## 2020-04-15 ENCOUNTER — Ambulatory Visit (HOSPITAL_COMMUNITY): Payer: Medicare Other | Admitting: Psychiatry

## 2020-04-15 NOTE — Telephone Encounter (Signed)
Therapist called patient for scheduled appointment.  However, patient is not feeling well as he was at the ER last night due to severe back pain.  Patient and therapist agreed to reschedule appointment.

## 2020-05-07 ENCOUNTER — Ambulatory Visit (INDEPENDENT_AMBULATORY_CARE_PROVIDER_SITE_OTHER): Payer: Medicare Other | Admitting: Psychiatry

## 2020-05-07 ENCOUNTER — Other Ambulatory Visit: Payer: Self-pay

## 2020-05-07 DIAGNOSIS — F3130 Bipolar disorder, current episode depressed, mild or moderate severity, unspecified: Secondary | ICD-10-CM | POA: Diagnosis not present
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
## 2020-05-21 ENCOUNTER — Other Ambulatory Visit: Payer: Self-pay

## 2020-05-21 ENCOUNTER — Ambulatory Visit (INDEPENDENT_AMBULATORY_CARE_PROVIDER_SITE_OTHER): Payer: Medicare Other | Admitting: Psychiatry

## 2020-05-21 DIAGNOSIS — F3130 Bipolar disorder, current episode depressed, mild or moderate severity, unspecified: Secondary | ICD-10-CM
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
## 2020-05-27 ENCOUNTER — Other Ambulatory Visit (HOSPITAL_COMMUNITY): Payer: Self-pay | Admitting: Psychiatry

## 2020-05-27 ENCOUNTER — Telehealth (INDEPENDENT_AMBULATORY_CARE_PROVIDER_SITE_OTHER): Payer: Medicare Other | Admitting: Psychiatry

## 2020-05-27 ENCOUNTER — Encounter (HOSPITAL_COMMUNITY): Payer: Self-pay | Admitting: Psychiatry

## 2020-05-27 ENCOUNTER — Other Ambulatory Visit: Payer: Self-pay

## 2020-05-27 ENCOUNTER — Telehealth (HOSPITAL_COMMUNITY): Payer: Self-pay | Admitting: *Deleted

## 2020-05-27 DIAGNOSIS — F3130 Bipolar disorder, current episode depressed, mild or moderate severity, unspecified: Secondary | ICD-10-CM

## 2020-05-27 MED ORDER — GABAPENTIN 300 MG PO CAPS
300.0000 mg | ORAL_CAPSULE | Freq: Three times a day (TID) | ORAL | 2 refills | Status: DC
Start: 2020-05-27 — End: 2020-05-27

## 2020-05-27 MED ORDER — ALPRAZOLAM 2 MG PO TABS
2.0000 mg | ORAL_TABLET | Freq: Four times a day (QID) | ORAL | 2 refills | Status: DC
Start: 2020-05-27 — End: 2020-06-24

## 2020-05-27 MED ORDER — TRAZODONE HCL 100 MG PO TABS: ORAL_TABLET | ORAL | 2 refills | Status: DC

## 2020-05-27 MED ORDER — LAMOTRIGINE 200 MG PO TABS: ORAL_TABLET | ORAL | 2 refills | Status: DC

## 2020-05-27 MED ORDER — PAROXETINE HCL 30 MG PO TABS: 30.0000 mg | ORAL_TABLET | Freq: Every day | ORAL | 2 refills | Status: DC

## 2020-05-27 MED ORDER — LATUDA 120 MG PO TABS: 120.0000 mg | ORAL_TABLET | Freq: Every day | ORAL | 2 refills | Status: DC

## 2020-05-27 NOTE — Telephone Encounter (Signed)
Per pt he is taking Gabapentin 600 mg 4 times a Day. Per pt he would like to have provider to please update/change it in his chart. Per pt it was prescribed to him this way by [facility].

## 2020-05-27 NOTE — Telephone Encounter (Signed)
Ok. I will d/c my script

## 2020-05-27 NOTE — Telephone Encounter (Signed)
Are they still prescribing it,? I have been prescribing a much lower dose. If he is on that high a dose they will need to prescribe it

## 2020-05-27 NOTE — Telephone Encounter (Signed)
Noted, informed patient with what provider stated and he verbalized understanding.

## 2020-05-27 NOTE — Telephone Encounter (Signed)
Per pt they are the ones that are prescribing it now with that high of a dose.
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
## 2020-06-03 ENCOUNTER — Other Ambulatory Visit: Payer: Self-pay

## 2020-06-03 ENCOUNTER — Ambulatory Visit (INDEPENDENT_AMBULATORY_CARE_PROVIDER_SITE_OTHER): Payer: Medicare Other | Admitting: Psychiatry

## 2020-06-03 DIAGNOSIS — F3130 Bipolar disorder, current episode depressed, mild or moderate severity, unspecified: Secondary | ICD-10-CM

## 2020-06-03 NOTE — Progress Notes (Signed)
Virtual Visit via Telephone Note  I connected with Roger Duran on 06/03/20 at 2:05 PM EST  by telephone and verified that I am speaking with the correct person using two identifiers.  Location: Patient: Car Provider: [place] office    I discussed the limitations, risks, security and privacy concerns of performing an evaluation and management service by telephone and the availability of in person appointments. I also discussed with the patient that there may be a patient responsible charge related to this service. The patient expressed understanding and agreed to proceed.   I provided 42 minutes of non-face-to-face time during this encounter.   [name], LCSW   THERAPIST PROGRESS NOTE     Session Time: Wednesday 06/03/2020 2:05 PM - 2:47 PM    Participation Level: Active     Behavioral Response: Casual/ AlertAnxious and Depressed  Type of Therapy: Individual Therapy  Treatment Goals addressed: Patient wants to improve skills to cope with everyday stress/anxiety, learn how to say no to others and manage money/has ability to handle effectively the full variety of life's anxieties, develop ability to recognize, accept, and cope with feelings of depression  Interventions: CBT and Supportive  Summary: Roger Duran is a 50 y.o. male who  is referrred for services by psychiatrist Dr. Harrington Challenger for continuity of care. Patient has history of bipolar disorder and was seeing S. Schneidmiller for therapy. She now is out on medical leave. He reports one psychiatric hospitalization due to suicide attempt in 2009. He received outpatient treatment at [facility]. He eventually saw Ms. Schenidmiller and psychiatrist Dr. Harrington Challenger.  Patient reports he tends to have depressive and missed episodes.  He also reports constant anxiety along with paranoia and delusional thinking at times.  He reports being depressed most of the time and having to force himself out of bed.  Patient last was  seen via virtual visit about 2 weeks ago.  He reports decreased anxiety and stress about his finances as he has consolidated his bills and decreased his debt by $150 per month.  He also reports not loaning his roommate any money.  Per his report, his roommate starts a new job today.  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  He will do this during the time his grandmother naps.  He continues to report stress regarding being a caretaker for his grandmother.  He reports often being argumentative with her and expresses guilt about this.  He also reports dreading to take care of her up most days and expresses guilt about this as well.  She reports he started Paxil about a week ago per instructions from psychiatrist Dr. Harrington Challenger.  However he reports having several side effects including increased tremors, problems with his vision, drowsiness, legs trembling, and having difficulty with balance. He reports he has left message with medical staff regarding his concerns.  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, discussed possible effects on patient's overall wellbeing and emotional health, assisted patient develop behavioral targets regarding interaction with his grandmother (decrease argumentative behavior with grandmother from 2x per week to 0 times per week, reduce feelings of dread from 7 days/week to 3 days/week), began to assist patient identify factors affecting his interaction with his grandmother and feelings of dread, will send patient ABC behavioral analysis in preparation for next session,  discussed rationale for and developed plan with patient to participate in 1 pleasurable activity per day, requested patient record and bring to next session, will follow up with medical staff regarding  patient's earlier phone call regarding medication.  Plan: Return again in 2 weeks.     Diagnosis: Axis I: Bipolar, Depressed    Axis II: No diagnosis    [name], LCSW 06/03/2020

## 2020-06-04 ENCOUNTER — Other Ambulatory Visit (HOSPITAL_COMMUNITY): Payer: Self-pay | Admitting: Psychiatry

## 2020-06-04 ENCOUNTER — Telehealth (HOSPITAL_COMMUNITY): Payer: Self-pay | Admitting: *Deleted

## 2020-06-04 MED ORDER — SERTRALINE HCL 25 MG PO TABS: 25.0000 mg | ORAL_TABLET | Freq: Every day | ORAL | 2 refills | Status: DC

## 2020-06-04 NOTE — Telephone Encounter (Signed)
Tell him Zoloft 2 mg sent in. He should wait a few days before starting it to let Paxil get out of his system

## 2020-06-04 NOTE — Telephone Encounter (Signed)
Patient called and [facility] stating he is having side effects to the Paxil. Per pt it is making him feel like he's drunk and sleepy and messing with his vision. Per pt he would like to know if provider can prescribe him something else. [phone]

## 2020-06-04 NOTE — Telephone Encounter (Signed)
Patient informed and verbalized understanding

## 2020-06-16 ENCOUNTER — Other Ambulatory Visit (HOSPITAL_COMMUNITY): Payer: Self-pay | Admitting: Psychiatry

## 2020-06-16 MED ORDER — LATUDA 120 MG PO TABS: 120.0000 mg | ORAL_TABLET | Freq: Every day | ORAL | 2 refills | Status: DC

## 2020-06-17 ENCOUNTER — Other Ambulatory Visit: Payer: Self-pay

## 2020-06-17 ENCOUNTER — Ambulatory Visit (INDEPENDENT_AMBULATORY_CARE_PROVIDER_SITE_OTHER): Payer: Medicare Other | Admitting: Psychiatry

## 2020-06-17 DIAGNOSIS — F3130 Bipolar disorder, current episode depressed, mild or moderate severity, unspecified: Secondary | ICD-10-CM

## 2020-06-17 NOTE — Progress Notes (Signed)
Virtual Visit via Telephone Note  I connected with Roger Duran on 06/17/20 at 2:10 PM EST by telephone and verified that I am speaking with the correct person using two identifiers.  Location: Patient: Home Provider: [place] office    I discussed the limitations, risks, security and privacy concerns of performing an evaluation and management service by telephone and the availability of in person appointments. I also discussed with the patient that there may be a patient responsible charge related to this service. The patient expressed understanding and agreed to proceed.   I provided 45 minutes of non-face-to-face time during this encounter.   [name], LCSW    THERAPIST PROGRESS NOTE     Session Time: Wednesday 216/2022 2:10 PM - 2:55 PM   Participation Level: Active     Behavioral Response: Casual/ Alert/less depressed and anxious  Type of Therapy: Individual Therapy  Treatment Goals addressed: Patient wants to improve skills to cope with everyday stress/anxiety, learn how to say no to others and manage money/has ability to handle effectively the full variety of life's anxieties, develop ability to recognize, accept, and cope with feelings of depression  Interventions: CBT and Supportive  Summary: Roger Duran is a 50 y.o. male who  is referrred for services by psychiatrist Dr. Harrington Challenger for continuity of care. Patient has history of bipolar disorder and was seeing S. Schneidmiller for therapy. She now is out on medical leave. He reports one psychiatric hospitalization due to suicide attempt in 2009. He received outpatient treatment at [facility]. He eventually saw Ms. Schenidmiller and psychiatrist Dr. Harrington Challenger.  Patient reports he tends to have depressive and missed episodes.  He also reports constant anxiety along with paranoia and delusional thinking at times.  He reports being depressed most of the time and having to force himself out of bed.  Patient  last was seen via virtual visit about 2 weeks ago.  He reports feeling better since changing medication as instructed by psychiatrist Dr. Harrington Challenger.  He states feeling more upbeat, having more energy, and more drive to do things.  Per his report, his roommate and grandmother have noticed and made positive comments he continues to experience stress and anxiety regarding taking care of his grandmother.  He continues to report irritability, dread, and argumentative behavior at times.  He has been trying to participate in more activities.    Suicidal/Homicidal: No, reports passive thoughts of being better off dead but denies any plan or intent to harm self, he agrees to call this practice, call 911, or have someone take him to the ER should symptoms worsen.  Patient also was provided with crisis contact information and crisis hotline numbers.   Therapist Response: reviewed symptoms, administered PHQ 2 and 9 with a C- SSRS screen praised and reinforced patient's medication compliance, praised and reinforced his involvement in activity, discussed effects, did ABC behavioral analysis with patient regarding negative interaction with grandmother, assisted patient began to identify areas to intervene to change his behavior, developed plan with patient to practice deep breathing before mealtime with his grandmother and again before helping grandmother prepare for bed as theseare  times that the negative interaction usually occurs, also assisted patient identify early signs of anger and anxiety, developed plan with patient to disengage and use relaxation technique(deep breathing) when recognizing early signs of tension, develop plan with patient to record when he has negative interaction(raised tone of voice, snappy remarks) with grandmother.  Plan: Return again in 2 weeks.  Diagnosis: Axis I: Bipolar, Depressed    Axis II: No diagnosis    [name], LCSW 06/17/2020

## 2020-06-24 ENCOUNTER — Encounter (HOSPITAL_COMMUNITY): Payer: Self-pay | Admitting: Psychiatry

## 2020-06-24 ENCOUNTER — Encounter (HOSPITAL_COMMUNITY): Payer: Self-pay

## 2020-06-24 ENCOUNTER — Other Ambulatory Visit: Payer: Self-pay

## 2020-06-24 ENCOUNTER — Telehealth (INDEPENDENT_AMBULATORY_CARE_PROVIDER_SITE_OTHER): Payer: Medicare Other | Admitting: Psychiatry

## 2020-06-24 DIAGNOSIS — F3130 Bipolar disorder, current episode depressed, mild or moderate severity, unspecified: Secondary | ICD-10-CM | POA: Diagnosis not present

## 2020-06-24 MED ORDER — TRAZODONE HCL 100 MG PO TABS: ORAL_TABLET | ORAL | 2 refills | Status: DC

## 2020-06-24 MED ORDER — ALPRAZOLAM 2 MG PO TABS: 2.0000 mg | ORAL_TABLET | Freq: Four times a day (QID) | ORAL | 2 refills | Status: DC

## 2020-06-24 MED ORDER — LATUDA 120 MG PO TABS: 120.0000 mg | ORAL_TABLET | Freq: Every day | ORAL | 2 refills | Status: DC

## 2020-06-24 MED ORDER — LAMOTRIGINE 200 MG PO TABS: ORAL_TABLET | ORAL | 2 refills | Status: DC

## 2020-06-24 MED ORDER — SERTRALINE HCL 50 MG PO TABS: 50.0000 mg | ORAL_TABLET | Freq: Every day | ORAL | 2 refills | Status: DC

## 2020-06-24 NOTE — Progress Notes (Signed)
Virtual Visit via Telephone Note  I connected with Roger Duran on 06/24/20 at  1:00 PM EST by telephone and verified that I am speaking with the correct person using two identifiers.  Location: Patient: home Provider: home   I discussed the limitations, risks, security and privacy concerns of performing an evaluation and management service by telephone and the availability of in person appointments. I also discussed with the patient that there may be a patient responsible charge related to this service. The patient expressed understanding and agreed to proceed.    I discussed the assessment and treatment plan with the patient. The patient was provided an opportunity to ask questions and all were answered. The patient agreed with the plan and demonstrated an understanding of the instructions.   The patient was advised to call back or seek an in-person evaluation if the symptoms worsen or if the condition fails to improve as anticipated.  I provided 15 minutes of non-face-to-face time during this encounter.   [name], MD  [facility] MD/PA/NP OP Progress Note  06/24/2020 1:23 PM Roger Duran  MRN:  [id number]  Chief Complaint:  Chief Complaint    Anxiety; Depression; Manic Behavior; Follow-up     HPI: This patient is a 50 year old single White male who [place] He is gay but is not currently in a relationship. He is on disability for bipolar disorder.  The patient returns for follow-up after 4 weeks.  Last time we tried adding Paxil to his regimen because the Trintellix was not helping his depression.  Unfortunately it made him very lethargic and dizzy.  I had stopped it a couple of weeks ago in favor of Zoloft 25 mg.  So far he is doing better and seems to be less depressed and having a little bit more energy.  He still thinks it has a ways to go because he is not all that perkier excited about anything I suggested that we go the 50 mg  dose.  His essential tremor is still "bad."  He is about to undergo a deep brain stimulator insertion on March 10 which she hopes will be helpful.  In general he is sleeping well he is not having significant mood swings or anger irritability and no thoughts of self-harm or suicide Visit Diagnosis:    ICD-10-CM   1. Bipolar I disorder, most recent episode depressed ([place])  F31.30     Past Psychiatric History: Hospitalization in 2009 after suicide attempt  Past Medical History:  Past Medical History:  Diagnosis Date  . Anxiety   . Bipolar 1 disorder ([place])   . Depression   . Mania ([place])   . Psoriasis     Past Surgical History:  Procedure Laterality Date  . APPENDECTOMY    . back procedures    . COLONOSCOPY N/A 11/14/2014   Procedure: COLONOSCOPY;  Surgeon: Rogene Houston, MD;  Location: [facility];  Service: Endoscopy;  Laterality: N/A;  11:10 - moved to 8:30 - Ann to notify  . TONSILLECTOMY    . WRIST SURGERY      Family Psychiatric History: see below  Family History:  Family History  Problem Relation Age of Onset  . Hyperlipidemia Mother   . Heart disease Father   . Anxiety disorder Father   . Bipolar disorder Maternal Grandfather     Social History:  Social History   Socioeconomic History  . Marital status: Single    Spouse name: Not on  file  . Number of children: Not on file  . Years of education: Not on file  . Highest education level: Not on file  Occupational History  . Not on file  Tobacco Use  . Smoking status: Former Smoker    Types: Cigarettes, E-cigarettes    Quit date: 11/21/2019    Years since quitting: 0.5  . Smokeless tobacco: Former Network engineer  . Vaping Use: Not on file  Substance and Sexual Activity  . Alcohol use: No    Alcohol/week: 0.0 standard drinks    Comment: rarely  . Drug use: No    Comment: 1995-2000 PER PT HE DID A LOT OF COCAINE AND PILLS AND DRANK A LOT  . Sexual activity: Not Currently    Partners: Male    Birth  control/protection: Condom  Other Topics Concern  . Not on file  Social History Narrative  . Not on file   Social Determinants of Health   Financial Resource Strain: Not on file  Food Insecurity: Not on file  Transportation Needs: Not on file  Physical Activity: Not on file  Stress: Not on file  Social Connections: Not on file    Allergies:  Allergies  Allergen Reactions  . Penicillins Itching and Rash    Metabolic Disorder Labs: No results found for: HGBA1C, MPG No results found for: PROLACTIN No results found for: CHOL, TRIG, HDL, CHOLHDL, VLDL, LDLCALC No results found for: TSH  Therapeutic Level Labs: Lab Results  Component Value Date   LITHIUM 1.10 10/15/2014   No results found for: VALPROATE No components found for:  CBMZ  Current Medications: Current Outpatient Medications  Medication Sig Dispense Refill  . sertraline (ZOLOFT) 50 MG tablet Take 1 tablet (50 mg total) by mouth daily. 30 tablet 2  . acyclovir (ZOVIRAX) 400 MG tablet Take 1 tablet (400 mg total) by mouth 3 (three) times daily. 60 tablet 10  . Adalimumab (HUMIRA PEN) 40 MG/0.4ML PNKT Inject 40 mg into the skin every 14 (fourteen) days. For maintenance. 2 each 4  . alprazolam (XANAX) 2 MG tablet Take 1 tablet (2 mg total) by mouth in the morning, at noon, in the evening, and at bedtime. 120 tablet 2  . baclofen (LIORESAL) 10 MG tablet Take 10 mg by mouth 2 (two) times daily as needed.    . cycloSPORINE (RESTASIS) 0.05 % ophthalmic emulsion Place 1 drop into both eyes 2 (two) times daily.    Marland Kitchen lamoTRIgine (LAMICTAL) 200 MG tablet TAKE  (1)  TABLET TWICE A DAY. 60 tablet 2  . loratadine (CLARITIN) 10 MG tablet Take 10 mg by mouth daily.    . Lurasidone HCl (LATUDA) 120 MG TABS Take 1 tablet (120 mg total) by mouth daily. 90 tablet 2  . omeprazole (PRILOSEC) 20 MG capsule Take 20 mg by mouth daily.    . propranolol (INNOPRAN XL) 80 MG 24 hr capsule Take by mouth.    . propranolol ER (INDERAL LA) 120  MG 24 hr capsule Take 120 mg by mouth daily.    . Sennosides (SENOKOT PO) Take by mouth as needed.    Marland Kitchen tenofovir (VIREAD) 300 MG tablet Take 300 mg by mouth daily. (Patient not taking: Reported on 11/28/2019)    . traZODone (DESYREL) 100 MG tablet Take one or two at bedtime 60 tablet 2  . Wheat Dextrin (BENEFIBER DRINK MIX) PACK Take 4 g by mouth at bedtime. (Patient taking differently: Take 4 g by mouth as needed. )  No current facility-administered medications for this visit.     Musculoskeletal: Strength & Muscle Tone: within normal limits Gait & Station: normal Patient leans: N/A  Psychiatric Specialty Exam: Review of Systems  Neurological: Positive for tremors.  All other systems reviewed and are negative.   There were no vitals taken for this visit.There is no height or weight on file to calculate BMI.  General Appearance: NA  Eye Contact:  NA  Speech:  Clear and Coherent  Volume:  Normal  Mood:  Euthymic  Affect:  Appropriate and Congruent  Thought Process:  Goal Directed  Orientation:  Full (Time, Place, and Person)  Thought Content: WDL   Suicidal Thoughts:  No  Homicidal Thoughts:  No  Memory:  Immediate;   Good Recent;   Good Remote;   Good  Judgement:  Good  Insight:  Good  Psychomotor Activity:  Tremor  Concentration:  Concentration: Good and Attention Span: Good  Recall:  Good  Fund of Knowledge: Good  Language: Good  Akathisia:  No  Handed:  Right  AIMS (if indicated): not done  Assets:  Communication Skills Desire for Improvement Resilience Social Support Talents/Skills  ADL's:  Intact  Cognition: WNL  Sleep:  Good   Screenings: PHQ2-9   Flowsheet Row Video Visit from 06/24/2020 in [place] from 06/17/2020 in [place] from 01/07/2014 in [place]  PHQ-2 Total Score 1 4 0  PHQ-9 Total Score -- 11 --     Flowsheet Row Counselor from 06/17/2020 in [place] and Plan: This patient is a 50 year old male with a history of bipolar disorder and anxiety.  He did not tolerate Paxil well but is doing somewhat better on Zoloft.  Since he is still a bit anxious we will increase the Zoloft to 50 mg daily.  He will continue Lamictal 200 mg twice daily for mood stabilization, lurasidone 120 mg daily for mood stabilization gabapentin 300 mg 3 times daily for anxiety Xanax 2 mg 4 times daily for anxiety.  He will return to see me in 2 months   [name], MD 06/24/2020, 1:23 PM

## 2020-07-01 ENCOUNTER — Other Ambulatory Visit: Payer: Self-pay

## 2020-07-01 ENCOUNTER — Ambulatory Visit (INDEPENDENT_AMBULATORY_CARE_PROVIDER_SITE_OTHER): Payer: Medicare Other | Admitting: Psychiatry

## 2020-07-01 DIAGNOSIS — F3130 Bipolar disorder, current episode depressed, mild or moderate severity, unspecified: Secondary | ICD-10-CM | POA: Diagnosis not present

## 2020-07-01 NOTE — Progress Notes (Signed)
Virtual Visit via Video Note  I connected with Roger Duran on 07/01/20 at 2:10 PM EST  by a video enabled telemedicine application and verified that I am speaking with the correct person using two identifiers.  Location: Patient: Home Provider: [place] office    I discussed the limitations of evaluation and management by telemedicine and the availability of in person appointments. The patient expressed understanding and agreed to proceed.  I provided 40 minutes of non-face-to-face time during this encounter.   [name], LCSW   THERAPIST PROGRESS NOTE     Session Time: Wednesday 07/01/2020 2:10 PM - 2:50 PM    Participation Level: Active     Behavioral Response: Casual/ Alert/less depressed and anxious  Type of Therapy: Individual Therapy  Treatment Goals addressed: Patient wants to improve skills to cope with everyday stress/anxiety, learn how to say no to others and manage money/has ability to handle effectively the full variety of life's anxieties, develop ability to recognize, accept, and cope with feelings of depression  Interventions: CBT and Supportive  Summary: CORIAN HANDLEY is a 50 y.o. male who  is referrred for services by psychiatrist Dr. Harrington Challenger for continuity of care. Patient has history of bipolar disorder and was seeing S. Schneidmiller for therapy. She now is out on medical leave. He reports one psychiatric hospitalization due to suicide attempt in 2009. He received outpatient treatment at [facility]. He eventually saw Ms. Schenidmiller and psychiatrist Dr. Harrington Challenger.  Patient reports he tends to have depressive and missed episodes.  He also reports constant anxiety along with paranoia and delusional thinking at times.  He reports being depressed most of the time and having to force himself out of bed.  Patient last was seen via virtual visit about 2 weeks ago.  He reports continuing to experience improved mood and says the medication change has  been very helpful.  He reports implementing plan developed in last session to practice deep breathing prior to interaction with his grandmother and situations that would normally trigger a negative reaction from him.  He says this has been very helpful and says he has noticed a significant reduction in the frequency of raising his tone of voice or making snappy remarks to grandmother.  He reports much more positive interaction and also states he no longer feels dread when having to provide care.  He cites a recent incident in which he had to provide care for her in a very challenging situation.  He is very pleased with himself as he used deep breathing to calm self and then was able to use self talk to manage the situation.  He also reports decreased stress regarding his roommate.  He expresses sadness today as he is experiencing grief and loss issues.  Per patient's report, his best friend's mother died this past weekend and patient plans to attend the services this Friday.     Suicidal/Homicidal: No, without intent/plan   Therapist Response: reviewed symptoms, praised and reinforced patient's implementation of plan regarding using relaxation technique when recognizing triggers and early signs of tension, discussed effects on his mood and behavior as well as his interaction with his grandmother, encouraged patient to continue efforts consistently, discussed grief and loss issues, facilitated patient expressing thoughts and feelings, validated and normalized feelings  Plan: Return again in 2 weeks.     Diagnosis: Axis I: Bipolar, Depressed    Axis II: No diagnosis    [name], LCSW 07/01/2020

## 2020-07-14 ENCOUNTER — Other Ambulatory Visit: Payer: Self-pay

## 2020-07-14 ENCOUNTER — Ambulatory Visit: Payer: Medicare Other | Admitting: Physician Assistant

## 2020-07-14 ENCOUNTER — Encounter: Payer: Self-pay | Admitting: Physician Assistant

## 2020-07-14 DIAGNOSIS — L409 Psoriasis, unspecified: Secondary | ICD-10-CM

## 2020-07-14 MED ORDER — CLOBETASOL PROPIONATE 0.05 % EX SOLN: 1.0000 "application " | Freq: Two times a day (BID) | CUTANEOUS | 9 refills | Status: AC

## 2020-07-16 ENCOUNTER — Other Ambulatory Visit: Payer: Self-pay

## 2020-07-16 ENCOUNTER — Ambulatory Visit (HOSPITAL_COMMUNITY): Payer: Medicare Other | Admitting: Psychiatry

## 2020-07-22 ENCOUNTER — Other Ambulatory Visit: Payer: Self-pay | Admitting: Dermatology

## 2020-07-22 DIAGNOSIS — L400 Psoriasis vulgaris: Secondary | ICD-10-CM

## 2020-07-27 ENCOUNTER — Other Ambulatory Visit: Payer: Self-pay | Admitting: Physician Assistant

## 2020-07-28 ENCOUNTER — Encounter: Payer: Self-pay | Admitting: Physician Assistant

## 2020-07-28 NOTE — Progress Notes (Signed)
   Follow-Up Visit   Subjective  Roger Duran is a 50 y.o. male who presents for the following: New Patient (Initial Visit) (Patient here today for Psoriasis, per patient he's been taking Humira and it's been doing well. No current flares.).   The following portions of the chart were reviewed this encounter and updated as appropriate:  Tobacco  Allergies  Meds  Problems  Med Hx  Surg Hx  Fam Hx      Objective  Well appearing patient in no apparent distress; mood and affect are within normal limits.  A full examination was performed including scalp, head, eyes, ears, nose, lips, neck, chest, axillae, abdomen, back, buttocks, bilateral upper extremities, bilateral lower extremities, hands, feet, fingers, toes, fingernails, and toenails. All findings within normal limits unless otherwise noted below.  Objective  Left 2nd Finger Nail Plate, Left Breast, Mid Parietal Scalp: Completely clear with Humira - fingernails thick and turned up on the ends. No signs of tenosynovitis or dactylitis. Skin is mostly clear. Scalp has significant scale.   Assessment & Plan  Psoriasis (3) Left Breast; Left 2nd Finger Nail Plate; Mid Parietal Scalp  Continue Humira & use Clobetasol Scalp Solution on Head & fingernails  clobetasol (TEMOVATE) 0.05 % external solution - Left 2nd Finger Nail Plate, Left Breast, Mid Parietal Scalp    I, Riki Gehring, PA-C, have reviewed all documentation's for this visit.  The documentation on 07/28/20 for the exam, diagnosis, procedures and orders are all accurate and complete.

## 2020-07-29 ENCOUNTER — Ambulatory Visit (INDEPENDENT_AMBULATORY_CARE_PROVIDER_SITE_OTHER): Payer: Medicare Other | Admitting: Psychiatry

## 2020-07-29 ENCOUNTER — Other Ambulatory Visit: Payer: Self-pay

## 2020-07-29 DIAGNOSIS — F3130 Bipolar disorder, current episode depressed, mild or moderate severity, unspecified: Secondary | ICD-10-CM

## 2020-07-29 NOTE — Progress Notes (Signed)
Virtual Visit via Video Note  I connected with Roger Duran on 07/29/20 at 1:12 PM EDT  by a video enabled telemedicine application and verified that I am speaking with the correct person using two identifiers.  Location: Patient: Home Provider: [place] office   I discussed the limitations of evaluation and management by telemedicine and the availability of in person appointments. The patient expressed understanding and agreed to proceed.  I provided 39 minutes of non-face-to-face time during this encounter.   Roger Smoker, LCSW    THERAPIST PROGRESS NOTE     Session Time: Wednesday 07/29/2020 1:12 PM - 1:51 PM    Participation Level: Active     Behavioral Response: Casual/ Alert/less depressed and less anxious  Type of Therapy: Individual Therapy  Treatment Goals addressed: Patient wants to improve skills to cope with everyday stress/anxiety, learn how to say no to others and manage money/has ability to handle effectively the full variety of life's anxieties, develop ability to recognize, accept, and cope with feelings of depression  Interventions: CBT and Supportive  Summary: Roger Duran is a 50 y.o. male who  is referrred for services by psychiatrist Dr. Harrington Challenger for continuity of care. Patient has history of bipolar disorder and was seeing S. Schneidmiller for therapy. She now is out on medical leave. He reports one psychiatric hospitalization due to suicide attempt in 2009. He received outpatient treatment at [facility]. He eventually saw Ms. Schenidmiller and psychiatrist Dr. Harrington Challenger.  Patient reports he tends to have depressive and missed episodes.  He also reports constant anxiety along with paranoia and delusional thinking at times.  He reports being depressed most of the time and having to force himself out of bed.  Patient last was seen via virtual visit about 4 weeks ago.  He reports doing very well since last session.  He reports decreased stress  regarding roommate as roommate moved out about a month ago.  They have remained friends and still talk to each other.  Patient also is pleased as the friend has made arrangements to repay loans from patient.  He reports experiencing increased stress related to grandmother having seizures a couple of weeks ago and being hospitalized.  She now is on medication and has not had any more seizures since discharge from the hospital last weekend.  Patient is pleased with the way he managed the stress by pausing, taking some time for self.  He expresses appropriate concern about having a deep brain stimulator insertion procedure.  However, he is hopeful about the results of the surgery to control his tremors.  Patient reports being in a stable mood and reports being better able to manage stress regarding his caretaker responsibilities.  He also reports medication changes instructed by psychiatrist Dr. Harrington Challenger has been very helpful.  Suicidal/Homicidal: No, without intent/plan   Therapist Response: reviewed symptoms, praised and reinforced patient's use of helpful coping strategies/taking a pause/taking time for self, discussed stressors, facilitated expression of thoughts and feelings, validated feelings and normalized some anxiety and concern about surgery. Plan: Return again in 2 weeks.     Diagnosis: Axis I: Bipolar, Depressed    Axis II: No diagnosis    Roger Smoker, LCSW 07/29/2020

## 2020-08-10 ENCOUNTER — Telehealth: Payer: Self-pay | Admitting: Physician Assistant

## 2020-08-10 NOTE — Telephone Encounter (Signed)
Patient came by office to leave message for [facility], PA-C.  Patient has taken Acyclovir twice a day for years, but the newest prescription says three times a day.  Patient wants to verify that he should be taking it twice daily and not three times daily.  Patient prefers to take it twice daily.

## 2020-08-12 ENCOUNTER — Ambulatory Visit (INDEPENDENT_AMBULATORY_CARE_PROVIDER_SITE_OTHER): Payer: Medicare Other | Admitting: Psychiatry

## 2020-08-12 ENCOUNTER — Other Ambulatory Visit: Payer: Self-pay

## 2020-08-12 DIAGNOSIS — F3130 Bipolar disorder, current episode depressed, mild or moderate severity, unspecified: Secondary | ICD-10-CM

## 2020-08-12 NOTE — Progress Notes (Signed)
Virtual Visit via Video Note  I connected with [name] on 08/12/20 at 2:16 PM EDT  by a video enabled telemedicine application and verified that I am speaking with the correct person using two identifiers.  Location: Patient: Home Provider: [place] office    I discussed the limitations of evaluation and management by telemedicine and the availability of in person appointments. The patient expressed understanding and agreed to proceed.    I provided 24 minutes of non-face-to-face time during this encounter.   [name], LCSW    THERAPIST PROGRESS NOTE     Session Time: Wednesday 08/12/2020  2:16 PM - 2:40 PM    Participation Level: Active     Behavioral Response: Casual/ Alert/less depressed and less anxious  Type of Therapy: Individual Therapy  Treatment Goals addressed: Patient wants to improve skills to cope with everyday stress/anxiety, learn how to say no to others and manage money/has ability to handle effectively the full variety of life's anxieties, develop ability to recognize, accept, and cope with feelings of depression  Interventions: CBT and Supportive  Summary: Roger Duran is a 50 y.o. male who  is referrred for services by psychiatrist Dr. Harrington Challenger for continuity of care. Patient has history of bipolar disorder and was seeing S. Schneidmiller for therapy. She now is out on medical leave. He reports one psychiatric hospitalization due to suicide attempt in 2009. He received outpatient treatment at [facility]. He eventually saw Ms. Schenidmiller and psychiatrist Dr. Harrington Challenger.  Patient reports he tends to have depressive and missed episodes.  He also reports constant anxiety along with paranoia and delusional thinking at times.  He reports being depressed most of the time and having to force himself out of bed.  Patient last was seen via virtual visit about 2 weeks ago.  He reports continuing to do well since last session.  He still talks to  ex-roommate who still has not repaid loans to patient as his initial arrangements fell through.  However, pt reports not being stressed by this as friend starts a new job next month and will be able to repay the loans. Pt reports planning to have conversation with friend about a specific  repayment arrangement. He continues to reports some stress and anxiety re: caretaker responsibilities for grandmother but is managing well. He expresses frustration and disappointment process for his surgery is slow. He anticipates he will have the surgery in June. He reports overall mood has been stable. Suicidal/Homicidal: No, without intent/plan   Therapist Response: reviewed symptoms, praised and reinforced patient's use of helpful coping strategies and plans to have assertive conversation with friend, discussed stressors, facilitated expression of thoughts and feelings, validated feelings, discussed acceptance and coping statements to cope with feelings of disappointment regarding process for surgery  Plan: Return again in 2 weeks.     Diagnosis: Axis I: Bipolar, Depressed    Axis II: No diagnosis    [name], LCSW 08/12/2020

## 2020-08-13 ENCOUNTER — Encounter (HOSPITAL_COMMUNITY): Payer: Self-pay

## 2020-08-24 ENCOUNTER — Telehealth (INDEPENDENT_AMBULATORY_CARE_PROVIDER_SITE_OTHER): Payer: Medicare Other | Admitting: Psychiatry

## 2020-08-24 ENCOUNTER — Other Ambulatory Visit: Payer: Self-pay

## 2020-08-24 ENCOUNTER — Encounter (HOSPITAL_COMMUNITY): Payer: Self-pay | Admitting: Psychiatry

## 2020-08-24 DIAGNOSIS — F3130 Bipolar disorder, current episode depressed, mild or moderate severity, unspecified: Secondary | ICD-10-CM | POA: Diagnosis not present

## 2020-08-24 MED ORDER — TRAZODONE HCL 100 MG PO TABS: ORAL_TABLET | ORAL | 2 refills | Status: DC

## 2020-08-24 MED ORDER — ALPRAZOLAM 2 MG PO TABS: 2.0000 mg | ORAL_TABLET | Freq: Four times a day (QID) | ORAL | 2 refills | Status: DC

## 2020-08-24 MED ORDER — LAMOTRIGINE 200 MG PO TABS: ORAL_TABLET | ORAL | 2 refills | Status: DC

## 2020-08-24 MED ORDER — LATUDA 120 MG PO TABS: 120.0000 mg | ORAL_TABLET | Freq: Every day | ORAL | 2 refills | Status: DC

## 2020-08-24 MED ORDER — SERTRALINE HCL 50 MG PO TABS: 50.0000 mg | ORAL_TABLET | Freq: Every day | ORAL | 2 refills | Status: DC

## 2020-08-24 NOTE — Progress Notes (Signed)
Virtual Visit via Video Note  I connected with [name] on 08/24/20 at  2:20 PM EDT by a video enabled telemedicine application and verified that I am speaking with the correct person using two identifiers.  Location: Patient: home  Provider: Home office   I discussed the limitations of evaluation and management by telemedicine and the availability of in person appointments. The patient expressed understanding and agreed to proceed.   I discussed the assessment and treatment plan with the patient. The patient was provided an opportunity to ask questions and all were answered. The patient agreed with the plan and demonstrated an understanding of the instructions.   The patient was advised to call back or seek an in-person evaluation if the symptoms worsen or if the condition fails to improve as anticipated.  I provided 15 minutes of non-face-to-face time during this encounter.   [name], MD  [facility] MD/PA/NP OP Progress Note  08/24/2020 2:59 PM [name]  MRN:  [id number]  Chief Complaint:  Chief Complaint    Anxiety; Depression; Manic Behavior; Follow-up     HPI: This patient is a 50 year old single [place] male [place] He is gay but is not currently in a relationship. He is on disability for bipolar disorder.  The patient returns for follow-up after 2 months.  He has been diagnosed with disabling essential tremor by neurosurgeon at [facility].  He is now on the list to have deep brain stimulation.  He saw a neuropsychologist and now has to see a psychiatrist next month before he can be scheduled for surgery.  He said that the tremors in his arms hands and feet and legs are getting much worse.  He is trying to get the surgery scheduled sooner but no luck so far.  Overall however he said his mood has been stable and he has not been significantly depressed or manic.  He is sleeping fairly well.  His anxiety is under good  control Visit Diagnosis:    ICD-10-CM   1. Bipolar I disorder, most recent episode depressed ([place])  F31.30     Past Psychiatric History: Hospitalization in 2009 after suicide attempt  Past Medical History:  Past Medical History:  Diagnosis Date  . Anxiety   . Bipolar 1 disorder ([place])   . Depression   . Mania ([place])   . Psoriasis     Past Surgical History:  Procedure Laterality Date  . APPENDECTOMY    . back procedures    . COLONOSCOPY N/A 11/14/2014   Procedure: COLONOSCOPY;  Surgeon: Rogene Houston, MD;  Location: [facility] SUITE;  Service: Endoscopy;  Laterality: N/A;  11:10 - moved to 8:30 - Ann to notify  . TONSILLECTOMY    . WRIST SURGERY      Family Psychiatric History: see below  Family History:  Family History  Problem Relation Age of Onset  . Hyperlipidemia Mother   . Heart disease Father   . Anxiety disorder Father   . Bipolar disorder Maternal Grandfather     Social History:  Social History   Socioeconomic History  . Marital status: Single    Spouse name: Not on file  . Number of children: Not on file  . Years of education: Not on file  . Highest education level: Not on file  Occupational History  . Not on file  Tobacco Use  . Smoking status: Former Smoker    Types: Cigarettes, E-cigarettes    Quit date: 11/21/2019  Years since quitting: 0.7  . Smokeless tobacco: Former Network engineer  . Vaping Use: Not on file  Substance and Sexual Activity  . Alcohol use: No    Alcohol/week: 0.0 standard drinks    Comment: rarely  . Drug use: No    Comment: 1995-2000 PER PT HE DID A LOT OF COCAINE AND PILLS AND DRANK A LOT  . Sexual activity: Not Currently    Partners: Male    Birth control/protection: Condom  Other Topics Concern  . Not on file  Social History Narrative  . Not on file   Social Determinants of Health   Financial Resource Strain: Not on file  Food Insecurity: Not on file  Transportation Needs: Not on file  Physical Activity: Not  on file  Stress: Not on file  Social Connections: Not on file    Allergies:  Allergies  Allergen Reactions  . Apple Itching and Swelling    Throat and mouth itching and swelling Throat and mouth itching and swelling  . Bee Venom Anaphylaxis and Other (See Comments)    Weak, shaky, dizzy. Weak, shaky, dizzy.  Marland Kitchen Morphine Palpitations  . Aspirin Nausea And Vomiting  . Sulfa Antibiotics Nausea And Vomiting  . Dicyclomine Other (See Comments)    Aka Dicyclomine Aka Dicyclomine  . Other Other (See Comments)    Aka Dicyclomine  . Penicillins Itching and Rash    Metabolic Disorder Labs: No results found for: HGBA1C, MPG No results found for: PROLACTIN No results found for: CHOL, TRIG, HDL, CHOLHDL, VLDL, LDLCALC No results found for: TSH  Therapeutic Level Labs: Lab Results  Component Value Date   LITHIUM 1.10 10/15/2014   No results found for: VALPROATE No components found for:  CBMZ  Current Medications: Current Outpatient Medications  Medication Sig Dispense Refill  . propranolol (INDERAL) 20 MG tablet For taper: Start with 60 mg / 40 mg x 1 week, then reduce by 20 mg/week (printed instructions were provided to patient)    . acetaminophen (TYLENOL) 500 MG tablet Take by mouth.    Marland Kitchen acyclovir (ZOVIRAX) 400 MG tablet TAKE  (1)  TABLET  THREE TIMES DAILY AS NEEDED. 60 tablet 10  . alfuzosin (UROXATRAL) 10 MG 24 hr tablet Take 10 mg by mouth daily.    Marland Kitchen alprazolam (XANAX) 2 MG tablet Take 1 tablet (2 mg total) by mouth in the morning, at noon, in the evening, and at bedtime. 120 tablet 2  . baclofen (LIORESAL) 10 MG tablet Take 10 mg by mouth 2 (two) times daily as needed.    . clobetasol (TEMOVATE) 0.05 % external solution Apply 1 application topically 2 (two) times daily. 50 mL 9  . cycloSPORINE (RESTASIS) 0.05 % ophthalmic emulsion Place 1 drop into both eyes 2 (two) times daily.    Marland Kitchen EPINEPHrine 0.3 mg/0.3 mL IJ SOAJ injection Inject into the muscle.    . gabapentin  (NEURONTIN) 600 MG tablet Take 600 mg by mouth 4 (four) times daily.    Marland Kitchen HUMIRA PEN 40 MG/0.4ML PNKT INJECT 40MG  SUBCUTANEOUSLY EVERY 2 WEEKS AS DIRECTED. 2 each 0  . lamoTRIgine (LAMICTAL) 200 MG tablet TAKE  (1)  TABLET TWICE A DAY. 60 tablet 2  . loratadine (CLARITIN) 10 MG tablet Take 10 mg by mouth daily.    . Lurasidone HCl (LATUDA) 120 MG TABS Take 1 tablet (120 mg total) by mouth daily. 90 tablet 2  . Multiple Vitamins-Minerals (MENS 50+ MULTI VITAMIN/MIN) TABS Take 1 tablet by mouth  every morning.    . Omega-3 Fatty Acids (COROMEGA OMEGA 3 SQUEEZE) EMUL Take by mouth.    Marland Kitchen omeprazole (PRILOSEC) 20 MG capsule Take 20 mg by mouth daily.    . Potassium Gluconate 2.5 MEQ TABS Take by mouth.    . propranolol ER (INDERAL LA) 120 MG 24 hr capsule Take 120 mg by mouth daily.    . sertraline (ZOLOFT) 50 MG tablet Take 1 tablet (50 mg total) by mouth daily. 30 tablet 2  . traZODone (DESYREL) 100 MG tablet Take one or two at bedtime 60 tablet 2  . VEMLIDY 25 MG TABS Take 1 tablet by mouth daily.     No current facility-administered medications for this visit.     Musculoskeletal: Strength & Muscle Tone: within normal limits Gait & Station: normal Patient leans: N/A  Psychiatric Specialty Exam: Review of Systems  Neurological: Positive for tremors.  All other systems reviewed and are negative.   There were no vitals taken for this visit.There is no height or weight on file to calculate BMI.  General Appearance: Casual and Fairly Groomed  Eye Contact:  Good  Speech:  Clear and Coherent  Volume:  Normal  Mood:  Euthymic  Affect:  Appropriate and Congruent  Thought Process:  Goal Directed  Orientation:  Full (Time, Place, and Person)  Thought Content: Rumination   Suicidal Thoughts:  No  Homicidal Thoughts:  No  Memory:  Immediate;   Good Recent;   Good Remote;   Fair  Judgement:  Good  Insight:  Good  Psychomotor Activity:  Tremor  Concentration:  Concentration: Good and  Attention Span: Good  Recall:  Good  Fund of Knowledge: Good  Language: Good  Akathisia:  No  Handed:  Right  AIMS (if indicated): not done  Assets:  Communication Skills Desire for Improvement Resilience Social Support Talents/Skills  ADL's:  Intact  Cognition: WNL  Sleep:  Good   Screenings: PHQ2-9   Flowsheet Row Video Visit from 08/24/2020 in [facility] Video Visit from 06/24/2020 in [place] [facility] [facility] from 06/17/2020 in [facility] from 01/07/2014 in [facility]  PHQ-2 Total Score 0 1 4 0  PHQ-9 Total Score -- -- 11 --    Flowsheet Row Video Visit from 08/24/2020 in [place] [facility] from 06/17/2020 in [facility]  C-SSRS RISK CATEGORY No Risk Low Risk       Assessment and Plan: This patient is a 50 year old male with a history of bipolar disorder and anxiety.  He also has a disabling tremor which is thought to be familial.  He is awaiting surgery for this.  For now however his mood has been stable.  He will continue Zoloft 50 mg daily for depression, Lamictal 200 mg twice daily for mood stabilization, lurasidone 120 mg daily for mood stabilization,  and Xanax 2 mg 4 times daily for anxiety.  Return to see me in 2 months   [name], MD 08/24/2020, 2:59 PM

## 2020-08-25 ENCOUNTER — Encounter (HOSPITAL_COMMUNITY): Payer: Self-pay

## 2020-08-26 ENCOUNTER — Other Ambulatory Visit: Payer: Self-pay

## 2020-08-26 ENCOUNTER — Ambulatory Visit (INDEPENDENT_AMBULATORY_CARE_PROVIDER_SITE_OTHER): Payer: Medicare Other | Admitting: Psychiatry

## 2020-08-26 DIAGNOSIS — F3130 Bipolar disorder, current episode depressed, mild or moderate severity, unspecified: Secondary | ICD-10-CM | POA: Diagnosis not present

## 2020-08-26 NOTE — Progress Notes (Signed)
Virtual Visit via Video Note  I connected with Roger Duran on 08/26/20 at 2:18 PP EDT by a video enabled telemedicine application and verified that I am speaking with the correct person using two identifiers.  Location: Patient: Home Provider: [place] office    I discussed the limitations of evaluation and management by telemedicine and the availability of in person appointments. The patient expressed understanding and agreed to proceed.   I provided 37 minutes of non-face-to-face time during this encounter.   [name], LCSW    THERAPIST PROGRESS NOTE     Session Time: Wednesday 08/26/2020  2:19 PM - 2:56 PM   Participation Level: Active     Behavioral Response: Casual/ Alert/less depressed and less anxious  Type of Therapy: Individual Therapy  Treatment Goals addressed: Patient wants to improve skills to cope with everyday stress/anxiety, learn how to say no to others and manage money/has ability to handle effectively the full variety of life's anxieties, develop ability to recognize, accept, and cope with feelings of depression  Interventions: CBT and Supportive  Summary: Roger Duran is a 50 y.o. male who  is referrred for services by psychiatrist Dr. Harrington Challenger for continuity of care. Patient has history of bipolar disorder and was seeing S. Schneidmiller for therapy. She now is out on medical leave. He reports one psychiatric hospitalization due to suicide attempt in 2009. He received outpatient treatment at [facility]. He eventually saw Ms. Schenidmiller and psychiatrist Dr. Harrington Challenger.  Patient reports he tends to have depressive and missed episodes.  He also reports constant anxiety along with paranoia and delusional thinking at times.  He reports being depressed most of the time and having to force himself out of bed.  Patient last was seen via virtual visit about 2 weeks ago.  He reports minimal to no symptoms of depression and stable mood.  He expresses  continued frustration regarding the process for his surgery.  He is scheduled to see a psychiatrist on May 25 to be cleared for surgery.  He reports tremors have increased in intensity and frequency as well as progressed to his feet.  He reports calling an on-call nurse this past weekend to express his concerns about these changes in hopes that this would help expedite the process.  However, he has not heard anything from his care team.  He continues to worry about the effects of this on his daily functioning as well as his caretaker responsibilities for his grandmother.  He has been making accommodations, using meditation, and using coping statements.  Suicidal/Homicidal: No, without intent/plan   Therapist Response: reviewed symptoms, discussed stressors, facilitated expression of thoughts and feelings, validated feelings, praised and reinforced patient's use of helpful coping strategies, assisted patient examine thought patterns of should and replace with more helpful thought patterns of wish and prefer regarding surgery schedule, also assisted patient identify other coping statements, encouraged patient to continue using accommodations and meditation  Plan: Return again in 2 weeks.     Diagnosis: Axis I: Bipolar, Depressed    Axis II: No diagnosis    [name], LCSW 08/26/2020

## 2020-09-07 ENCOUNTER — Other Ambulatory Visit: Payer: Self-pay | Admitting: Physician Assistant

## 2020-09-07 DIAGNOSIS — L400 Psoriasis vulgaris: Secondary | ICD-10-CM

## 2020-09-09 ENCOUNTER — Telehealth (HOSPITAL_COMMUNITY): Payer: Self-pay | Admitting: Psychiatry

## 2020-09-09 ENCOUNTER — Other Ambulatory Visit: Payer: Self-pay

## 2020-09-09 ENCOUNTER — Ambulatory Visit (HOSPITAL_COMMUNITY): Payer: Medicare Other | Admitting: Psychiatry

## 2020-09-09 NOTE — Telephone Encounter (Signed)
Therapist attempted to contact patient twice via text through [place] for scheduled appointment, no response.  Therapist called patient who has schedule conflict.  Therapist and patient agreed to reschedule.

## 2020-09-16 ENCOUNTER — Ambulatory Visit (INDEPENDENT_AMBULATORY_CARE_PROVIDER_SITE_OTHER): Payer: Medicare Other | Admitting: Psychiatry

## 2020-09-16 ENCOUNTER — Other Ambulatory Visit: Payer: Self-pay

## 2020-09-16 DIAGNOSIS — F3130 Bipolar disorder, current episode depressed, mild or moderate severity, unspecified: Secondary | ICD-10-CM | POA: Diagnosis not present

## 2020-09-16 NOTE — Progress Notes (Signed)
Virtual Visit via Video Note  I connected with Roger Duran on 09/16/20 at 1:10 PM EDT  by a video enabled telemedicine application and verified that I am speaking with the correct person using two identifiers.  Location: Patient: Home Provider: [place] office    I discussed the limitations of evaluation and management by telemedicine and the availability of in person appointments. The patient expressed understanding and agreed to proceed.   I provided 44 minutes of non-face-to-face time during this encounter.   [name], LCSW   THERAPIST PROGRESS NOTE     Session Time: Wednesday 09/16/2020 1:10 PM - 1:54 PM    Participation Level: Active     Behavioral Response: Casual/ Alert/less depressed and less anxious  Type of Therapy: Individual Therapy  Treatment Goals addressed: Patient wants to improve skills to cope with everyday stress/anxiety, learn how to say no to others and manage money/has ability to handle effectively the full variety of life's anxieties, develop ability to recognize, accept, and cope with feelings of depression  Interventions: CBT and Supportive  Summary: Roger Duran is a 50 y.o. male who  is referrred for services by psychiatrist Dr. Harrington Challenger for continuity of care. Patient has history of bipolar disorder and was seeing S. Schneidmiller for therapy. She now is out on medical leave. He reports one psychiatric hospitalization due to suicide attempt in 2009. He received outpatient treatment at [facility]. He eventually saw Ms. Schenidmiller and psychiatrist Dr. Harrington Challenger.  Patient reports he tends to have depressive and missed episodes.  He also reports constant anxiety along with paranoia and delusional thinking at times.  He reports being depressed most of the time and having to force himself out of bed.  Patient last was seen via virtual visit about 2-3 weeks ago.  He continues to report minimal to no symptoms of depression and stable mood.   He continues to express frustration regarding process to be cleared for his upcoming surgery as his tremors have continued to worsen.  However, he has made helpful accommodations and has use coping statements to manage.  He expresses some anxiety about seeing the psychiatrist on May 25 but reports no anxiety or worry about the surgery.  He continues to provide caretaker responsibilities for his grandmother.  He reports this is stressful but coping well.  He is excited as he just purchased another car 2 weeks ago.  He is pleased with the way he managed this financial transaction by being thoughtful, preparing a budget, and doing his research on the value of the vehicles involved.  He also reports continued daily contact with his ex roommate/friend.  He is pleased he and friend have developed a formal repayment agreement regarding money borrowed from patient.  Patient is pleased with his progress and treatment but still expresses some concern regarding emotion regulation as well as thoughts and feelings of regret regarding past mistakes.  He is pleased with his recognition of should and ought statements as well as his efforts to use thought stopping.  Suicidal/Homicidal: No, without intent/plan   Therapist Response: reviewed symptoms, praised and reinforced patient's use of helpful coping strategies, discussed patient's progress in treatment, began to discuss next steps for treatment along with stepdown plan to include learning and implementing relapse prevention strategies, discussed stressors, facilitated expression of thoughts and feelings, validated feelings,   Plan: Return again in 2 weeks.     Diagnosis: Axis I: Bipolar, Depressed    Axis II: No diagnosis  [name], LCSW 09/16/2020

## 2020-09-21 ENCOUNTER — Ambulatory Visit (HOSPITAL_COMMUNITY): Payer: Medicare Other | Admitting: Psychiatry

## 2020-09-23 ENCOUNTER — Ambulatory Visit (HOSPITAL_COMMUNITY): Payer: Medicare Other | Admitting: Psychiatry

## 2020-09-24 ENCOUNTER — Telehealth (HOSPITAL_COMMUNITY): Payer: Self-pay | Admitting: Psychiatry

## 2020-09-24 NOTE — Telephone Encounter (Signed)
Therapist contacted patient regarding a call from his insurance carrier [employer] who has requested a clinical review of patient's case to discuss ongoing care.  Contact person is Denton Ar, phone number is [phone].  Patient indicates he may not be using [employer] but will contact provider [employer] regarding this issue.  He agrees to contact therapist to indicate whether or not clinician has permission to do clinical review with [employer].

## 2020-10-01 ENCOUNTER — Telehealth (HOSPITAL_COMMUNITY): Payer: Self-pay | Admitting: Psychiatry

## 2020-10-01 NOTE — Telephone Encounter (Signed)
Patients carrier called in "[employer]" [phone]", advising patients needs clinical review. Provider will needs pts demographics, our facility address, as well as a Tax ID.

## 2020-10-05 ENCOUNTER — Telehealth (HOSPITAL_COMMUNITY): Payer: Self-pay | Admitting: Psychiatry

## 2020-10-05 NOTE — Telephone Encounter (Signed)
Therapist contacted patient regarding contacting insurance carrier to do clinical review.  Patient gave verbal consent for therapist to contact carrier for clinical review.

## 2020-10-05 NOTE — Telephone Encounter (Signed)
Therapist returned call to patient's insurance carrier [employer] and talked with care advocate, Marcelo Baldy, at [phone].  Therapist did clinical review with care advocate.

## 2020-10-07 ENCOUNTER — Ambulatory Visit (INDEPENDENT_AMBULATORY_CARE_PROVIDER_SITE_OTHER): Payer: Medicare Other | Admitting: Psychiatry

## 2020-10-07 ENCOUNTER — Other Ambulatory Visit: Payer: Self-pay

## 2020-10-07 DIAGNOSIS — F3130 Bipolar disorder, current episode depressed, mild or moderate severity, unspecified: Secondary | ICD-10-CM

## 2020-10-07 NOTE — Progress Notes (Addendum)
Virtual Visit via Video Note  I connected with Roger Duran on 10/07/20 at  3:00 PM EDT by a video enabled telemedicine application and verified that I am speaking with the correct person using two identifiers.  Location: Patient: Home Provider: [place] office    I discussed the limitations of evaluation and management by telemedicine and the availability of in person appointments. The patient expressed understanding and agreed to proceed.  I provided 50 minutes of non-face-to-face time during this encounter.   [name], LCSW   THERAPIST PROGRESS NOTE     Session Time: Wednesday 10/07/2020 3:00 PM - 3:50 PM    Participation Level: Active     Behavioral Response: Casual/ Alert/less depressed and less anxious  Type of Therapy: Individual Therapy  Treatment Goals addressed: Patient wants to improve skills to cope with everyday stress/anxiety, learn how to say no to others and manage money/has ability to handle effectively the full variety of life's anxieties, develop ability to recognize, accept, and cope with feelings of depression  Interventions: CBT and Supportive  Summary: Roger Duran is a 50 y.o. male who  is referrred for services by psychiatrist Dr. Harrington Challenger for continuity of care. Patient has history of bipolar disorder and was seeing S. Schneidmiller for therapy. She now is out on medical leave. He reports one psychiatric hospitalization due to suicide attempt in 2009. He received outpatient treatment at [facility]. He eventually saw Ms. Schenidmiller and psychiatrist Dr. Harrington Challenger.  Patient reports he tends to have depressive and missed episodes.  He also reports constant anxiety along with paranoia and delusional thinking at times.  He reports being depressed most of the time and having to force himself out of bed.  Patient last was seen via virtual visit about 2-3 weeks ago.  He reports increased symptoms of depression including excessive sleeping,  fatigue, poor motivation, difficulty completing tasks. He also reports increased anxiety and worry.  He has completed the neuropsychological testing and been cleared for surgery.  However, he still has not been given any information about a date for the surgery.  He expresses frustration as the shaking continues to worsen.  He reports difficulty eating, brushing his teeth and shaving.  Per patient's report, he often has to stop while engaging in these activities due to his hands shaking.  Once shaking lessens, he reports resuming involvement in the activity.  He reports sometimes dreading eating as he often drops food due to the shaking.  Per his report, he has lost 8 pounds in the past 2 months.   Suicidal/Homicidal: No, without intent/plan   Therapist Response: reviewed symptoms, discussed stressors, facilitated expression of thoughts and feelings, validated feelings, discussed the role of behavioral activation in coping with increased depression, developed plan with patient to resume use of daily planning, will send patient handouts, also developed plan with patient to walk 3 days a week for 20 to 30 minutes,  Plan: Return again in 2 weeks.     Diagnosis: Axis I: Bipolar, Depressed    Axis II: No diagnosis    [name], LCSW 10/07/2020

## 2020-10-17 ENCOUNTER — Other Ambulatory Visit (HOSPITAL_COMMUNITY): Payer: Self-pay | Admitting: Psychiatry

## 2020-10-21 ENCOUNTER — Other Ambulatory Visit: Payer: Self-pay

## 2020-10-21 ENCOUNTER — Ambulatory Visit (INDEPENDENT_AMBULATORY_CARE_PROVIDER_SITE_OTHER): Payer: Medicare Other | Admitting: Psychiatry

## 2020-10-21 DIAGNOSIS — F3130 Bipolar disorder, current episode depressed, mild or moderate severity, unspecified: Secondary | ICD-10-CM | POA: Diagnosis not present

## 2020-10-21 NOTE — Progress Notes (Signed)
Virtual Visit via Telephone Note  I connected with Roger Duran on 10/21/20 at 3:10 PM EDT  by telephone and verified that I am speaking with the correct person using two identifiers.  Location: Patient: Home Provider: [place] office    I discussed the limitations, risks, security and privacy concerns of performing an evaluation and management service by telephone and the availability of in person appointments. I also discussed with the patient that there may be a patient responsible charge related to this service. The patient expressed understanding and agreed to proceed.  I provided 45  minutes of non-face-to-face time during this encounter.   [name], LCSW     THERAPIST PROGRESS NOTE     Session Time: Wednesday 10/21/2020 3:10 PM - 3:55 PM    Participation Level: Active     Behavioral Response: Casual/ Alert/less depressed and less anxious  Type of Therapy: Individual Therapy  Treatment Goals addressed: Patient wants to improve skills to cope with everyday stress/anxiety, learn how to say no to others and manage money/has ability to handle effectively the full variety of life's anxieties, develop ability to recognize, accept, and cope with feelings of depression  Interventions: CBT and Supportive  Summary: Roger Duran is a 50 y.o. male who  is referrred for services by psychiatrist Dr. Harrington Challenger for continuity of care. Patient has history of bipolar disorder and was seeing S. Schneidmiller for therapy. She now is out on medical leave. He reports one psychiatric hospitalization due to suicide attempt in 2009. He received outpatient treatment at [facility]. He eventually saw Ms. Schenidmiller and psychiatrist Dr. Harrington Challenger.  Patient reports he tends to have depressive and missed episodes.  He also reports constant anxiety along with paranoia and delusional thinking at times.  He reports being depressed most of the time and having to force himself out of  bed.  Patient last was seen via virtual visit about 2 weeks ago.  He reports increased symptoms of anxiety including panic attacks and excessive worry since last session.  He reports increased stress as his grandmother can no longer walk due to having a flareup of gout in her left foot.  She now is unable to perform many of the self-care activities she once was doing.  Patient now is providing that care along with also having to lift his grandmother.  This is causing more problems with his back.  He also worries about his grandmother's emotional health as she is depressed per his report.  He worries that her condition is worsening.  He also worries about his own health as the shaking has progressed to his face.  He still expresses frustration as a date still has not been set for surgery.  He reports he has spent some time away from home overnight a couple of times since last session and reports this has been helpful.   Suicidal/Homicidal: No, without intent/plan   Therapist Response: reviewed symptoms, discussed stressors, facilitated expression of thoughts and feelings, validated feelings, encouraged patient to continue working with family regarding support and scheduling regular breaks for patient, also developed plan with patient to start going swimming in parents pool daily as weather permits, also developed plan for patient to start attending the gym Plan: Return again in 2 weeks.     Diagnosis: Axis I: Bipolar, Depressed    Axis II: No diagnosis    [name], LCSW 10/21/2020

## 2020-10-29 ENCOUNTER — Telehealth: Payer: Self-pay | Admitting: Dermatology

## 2020-10-29 NOTE — Telephone Encounter (Signed)
Per Dr Denna Haggard to wait at least 2 weeks, no great research or data available.

## 2020-10-29 NOTE — Telephone Encounter (Signed)
Patient is supposed to do a Humira injection today, but was diagnosed with COVID-19.  Should he do the injection or wait?

## 2020-11-04 ENCOUNTER — Ambulatory Visit (HOSPITAL_COMMUNITY): Payer: Medicare Other | Admitting: Psychiatry

## 2020-11-04 ENCOUNTER — Other Ambulatory Visit: Payer: Self-pay

## 2020-11-04 ENCOUNTER — Ambulatory Visit (INDEPENDENT_AMBULATORY_CARE_PROVIDER_SITE_OTHER): Payer: Medicare Other | Admitting: Psychiatry

## 2020-11-04 DIAGNOSIS — F3130 Bipolar disorder, current episode depressed, mild or moderate severity, unspecified: Secondary | ICD-10-CM

## 2020-11-04 NOTE — Progress Notes (Signed)
Virtual Visit via Video Note  I connected with Roger Duran on 11/04/20 at 3:11 PM EDT  by a video enabled telemedicine application and verified that I am speaking with the correct person using two identifiers.  Location: Patient:Home Provider: [place] office    I discussed the limitations of evaluation and management by telemedicine and the availability of in person appointments. The patient expressed understanding and agreed to proceed.  I provided 30 minutes of non-face-to-face time during this encounter.   [name], LCSW   THERAPIST PROGRESS NOTE     Session Time: Wednesday 11/04/2020 3:11 PM - 3:41 PM   Participation Level: Active     Behavioral Response: Casual/ Alert/less depressed and less anxious  Type of Therapy: Individual Therapy  Treatment Goals addressed: Patient wants to improve skills to cope with everyday stress/anxiety, learn how to say no to others and manage money/has ability to handle effectively the full variety of life's anxieties, develop ability to recognize, accept, and cope with feelings of depression  Interventions: CBT and Supportive  Summary: Roger Duran is a 50 y.o. male who  is referrred for services by psychiatrist Dr. Harrington Challenger for continuity of care. Patient has history of bipolar disorder and was seeing S. Schneidmiller for therapy. She now is out on medical leave. He reports one psychiatric hospitalization due to suicide attempt in 2009. He received outpatient treatment at [facility]. He eventually saw Ms. Schenidmiller and psychiatrist Dr. Harrington Challenger.  Patient reports he tends to have depressive and missed episodes.  He also reports constant anxiety along with paranoia and delusional thinking at times.  He reports being depressed most of the time and having to force himself out of bed.  Patient last was seen via virtual visit about 2 weeks ago.  He reports increased stress as he, his grandmother, father, and mother all have been  diagnosed with COVID.  Patient reports this along with the shaking in his body has made it very very difficult to provide care for his grandmother.  He says tremors have now progressed to his legs and have caused his knees to start locking.  This has affected his mobility and he reports recently falling.  He anticipates a hospital bed will be provided to his grandmother next month and he is hopeful about this being helpful in her care.  Patient reports there is no one to relieve him right now regarding care of his grandmother as everyone in his family is sick.  Prior to being diagnosed with COVID, patient did go away for a weekend trip and went swimming about twice.  He and therapist agreed to end session early today as patient does not feel well.  Suicidal/Homicidal: No, without intent/plan   Therapist Response: reviewed symptoms, discussed stressors, facilitated expression of thoughts and feelings, validated feelings, assisted patient identify realistic expectations of himself regarding household responsibilities and discuss letting some things go until he recovers from [place], discussed priorities regarding taking care of grandmother as well as self-care   Plan: Return again in 2 weeks.     Diagnosis: Axis I: Bipolar, Depressed    Axis II: No diagnosis    [name], LCSW 11/04/2020

## 2020-11-18 ENCOUNTER — Ambulatory Visit (INDEPENDENT_AMBULATORY_CARE_PROVIDER_SITE_OTHER): Payer: Medicare Other | Admitting: Psychiatry

## 2020-11-18 ENCOUNTER — Other Ambulatory Visit: Payer: Self-pay

## 2020-11-18 DIAGNOSIS — F3130 Bipolar disorder, current episode depressed, mild or moderate severity, unspecified: Secondary | ICD-10-CM | POA: Diagnosis not present

## 2020-11-18 NOTE — Progress Notes (Signed)
Virtual Visit via Telephone Note  I connected with Roger Duran on 11/18/20 at 3:15 PM EDT  by telephone and verified that I am speaking with the correct person using two identifiers.  Location: Patient: Home Provider: [place] office    I discussed the limitations, risks, security and privacy concerns of performing an evaluation and management service by telephone and the availability of in person appointments. I also discussed with the patient that there may be a patient responsible charge related to this service. The patient expressed understanding and agreed to proceed.  I provided 40 minutes of non-face-to-face time during this encounter.   [name], LCSW  THERAPIST PROGRESS NOTE     Session Time: Wednesday 11/18/2020 3:15 PM -  3:55 PM      Participation Level: Active     Behavioral Response: Casual/ Alert/less depressed and less anxious  Type of Therapy: Individual Therapy  Treatment Goals addressed: Patient wants to improve skills to cope with everyday stress/anxiety, learn how to say no to others and manage money/has ability to handle effectively the full variety of life's anxieties, develop ability to recognize, accept, and cope with feelings of depression  Interventions: CBT and Supportive  Summary: Roger Duran is a 50 y.o. male who  is referrred for services by psychiatrist Dr. Harrington Challenger for continuity of care. Patient has history of bipolar disorder and was seeing S. Schneidmiller for therapy. She now is out on medical leave. He reports one psychiatric hospitalization due to suicide attempt in 2009. He received outpatient treatment at [facility]. He eventually saw Ms. Schenidmiller and psychiatrist Dr. Harrington Challenger.  Patient reports he tends to have depressive and missed episodes.  He also reports constant anxiety along with paranoia and delusional thinking at times.  He reports being depressed most of the time and having to force himself out of  bed.  Patient last was seen via virtual visit about 2 weeks ago.  He reports continued symptoms of depression and anxiety including depressed mood, crying at times, difficulty forcing himself to get out of bed in the mornings, poor appetite, nervousness, excessive worry, memory difficulty, poor concentration, and weight loss.  He continues to worry about his grandmother as her health continues to decline.  He expresses guilt as sometimes he does not feel like performing his caretaker responsibilities and states he should not feel this way.  He also worries about making certain he is providing care for his grandmother in the right way.  He reports some relief regarding caretaker responsibilities as his mother has informed him she will relieve him 2-3 nights per week so he can spend time with his father and go swimming.  He also reports planning to go on a weekend trip with a friend next weekend.  Per patient's report, he is scheduled for surgery on 02/05/2021.  He expresses relief that surgery is not scheduled earlier is relieved it is scheduled.  He expresses continued frustration as the tremors persists and continue to progress.  Suicidal/Homicidal: No, without intent/plan   Therapist Response: reviewed symptoms, discussed stressors, facilitated expression of thoughts and feelings, validated feelings, assisted patient examine his thought patterns of should and ought regarding expectations of himself in providing care for his grandmother and replace with more helpful thoughts, assisted patient identify realistic expectations of self ,   Plan: Return again in 2 weeks.     Diagnosis: Axis I: Bipolar, Depressed    Axis II: No diagnosis    Jimi Giza E Traveion Ruddock, LCSW  11/18/2020   

## 2020-11-25 NOTE — Telephone Encounter (Signed)
SENDERRA IS AWARE HE WANTED TO STOP HUMIRA DUE TO [place] VIA FAX

## 2020-12-01 ENCOUNTER — Encounter (HOSPITAL_COMMUNITY): Payer: Self-pay | Admitting: Psychiatry

## 2020-12-01 ENCOUNTER — Other Ambulatory Visit: Payer: Self-pay

## 2020-12-01 ENCOUNTER — Telehealth (INDEPENDENT_AMBULATORY_CARE_PROVIDER_SITE_OTHER): Payer: Medicare Other | Admitting: Psychiatry

## 2020-12-01 DIAGNOSIS — F3130 Bipolar disorder, current episode depressed, mild or moderate severity, unspecified: Secondary | ICD-10-CM

## 2020-12-01 MED ORDER — ALPRAZOLAM 2 MG PO TABS: 2.0000 mg | ORAL_TABLET | Freq: Four times a day (QID) | ORAL | 2 refills | Status: DC

## 2020-12-01 MED ORDER — TRAZODONE HCL 100 MG PO TABS: ORAL_TABLET | ORAL | 2 refills | Status: DC

## 2020-12-01 MED ORDER — LATUDA 120 MG PO TABS: 120.0000 mg | ORAL_TABLET | Freq: Every day | ORAL | 2 refills | Status: DC

## 2020-12-01 MED ORDER — LAMOTRIGINE 200 MG PO TABS: ORAL_TABLET | ORAL | 2 refills | Status: DC

## 2020-12-01 MED ORDER — SERTRALINE HCL 50 MG PO TABS: 50.0000 mg | ORAL_TABLET | Freq: Every day | ORAL | 2 refills | Status: DC

## 2020-12-01 NOTE — Progress Notes (Signed)
Virtual Visit via Video Note  I connected with Roger Duran on 12/01/20 at  3:00 PM EDT by a video enabled telemedicine application and verified that I am speaking with the correct person using two identifiers.  Location: Patient: home Provider: office   I discussed the limitations of evaluation and management by telemedicine and the availability of in person appointments. The patient expressed understanding and agreed to proceed.      I discussed the assessment and treatment plan with the patient. The patient was provided an opportunity to ask questions and all were answered. The patient agreed with the plan and demonstrated an understanding of the instructions.   The patient was advised to call back or seek an in-person evaluation if the symptoms worsen or if the condition fails to improve as anticipated.  I provided 15 minutes of non-face-to-face time during this encounter.   [name], MD  [facility] MD/PA/NP OP Progress Note  12/01/2020 3:22 PM Roger Duran  MRN:  [id number]  Chief Complaint:  Chief Complaint   Anxiety; Depression; Follow-up    HPI: This patient is a 50 year old single [place] male who lives with his grandmother  in [place]. He is gay but is not currently in a relationship. He is on disability for bipolar disorder.  The patient returns for follow-up after 3 months.  Unfortunately stated that his 19 year old grandmother died last week.  He had spent 11 years living with her and taking care of her so this was quite a blow.  Apparently she had suffered from congestive heart failure and finally had a heart attack.  He was gratified that he and his mother could be with grandmother when she died.  He still has been somewhat sad and low this week and is trying to "determine how good and make a life for myself."  However he denies significant depression or suicidal ideation.  He is sleeping fairly well.  He is able to maintain his sense of humor.  He is going to have  brain surgery in October to treat his tremor. Visit Diagnosis:    ICD-10-CM   1. Bipolar I disorder, most recent episode depressed ([place])  F31.30       Past Psychiatric History: Hospitalization in 2009 after suicide attempt  Past Medical History:  Past Medical History:  Diagnosis Date   Anxiety    Bipolar 1 disorder ([place])    Depression    Mania ([place])    Psoriasis     Past Surgical History:  Procedure Laterality Date   APPENDECTOMY     back procedures     COLONOSCOPY N/A 11/14/2014   Procedure: COLONOSCOPY;  Surgeon: Rogene Houston, MD;  Location: [facility];  Service: Endoscopy;  Laterality: N/A;  11:10 - moved to 8:30 - Ann to notify   TONSILLECTOMY     WRIST SURGERY      Family Psychiatric History: see below  Family History:  Family History  Problem Relation Age of Onset   Hyperlipidemia Mother    Heart disease Father    Anxiety disorder Father    Bipolar disorder Maternal Grandfather     Social History:  Social History   Socioeconomic History   Marital status: Single    Spouse name: Not on file   Number of children: Not on file   Years of education: Not on file   Highest education level: Not on file  Occupational History   Not on file  Tobacco Use   Smoking status: Former  Types: Cigarettes, E-cigarettes    Quit date: 11/21/2019    Years since quitting: 1.0   Smokeless tobacco: Former  Scientific laboratory technician Use: Not on file  Substance and Sexual Activity   Alcohol use: No    Alcohol/week: 0.0 standard drinks    Comment: rarely   Drug use: No    Comment: 1995-2000 PER PT HE DID A LOT OF COCAINE AND PILLS AND DRANK A LOT   Sexual activity: Not Currently    Partners: Male    Birth control/protection: Condom  Other Topics Concern   Not on file  Social History Narrative   Not on file   Social Determinants of Health   Financial Resource Strain: Not on file  Food Insecurity: Not on file  Transportation Needs: Not on file  Physical Activity:  Not on file  Stress: Not on file  Social Connections: Not on file    Allergies:  Allergies  Allergen Reactions   Apple Itching and Swelling    Throat and mouth itching and swelling Throat and mouth itching and swelling   Bee Venom Anaphylaxis and Other (See Comments)    Weak, shaky, dizzy. Weak, shaky, dizzy.   Morphine Palpitations   Aspirin Nausea And Vomiting   Sulfa Antibiotics Nausea And Vomiting   Dicyclomine Other (See Comments)    Aka Dicyclomine Aka Dicyclomine   Other Other (See Comments)    Aka Dicyclomine   Penicillins Itching and Rash    Metabolic Disorder Labs: No results found for: HGBA1C, MPG No results found for: PROLACTIN No results found for: CHOL, TRIG, HDL, CHOLHDL, VLDL, LDLCALC No results found for: TSH  Therapeutic Level Labs: Lab Results  Component Value Date   LITHIUM 1.10 10/15/2014   No results found for: VALPROATE No components found for:  CBMZ  Current Medications: Current Outpatient Medications  Medication Sig Dispense Refill   acetaminophen (TYLENOL) 500 MG tablet Take by mouth.     acyclovir (ZOVIRAX) 400 MG tablet TAKE  (1)  TABLET  THREE TIMES DAILY AS NEEDED. 60 tablet 10   alfuzosin (UROXATRAL) 10 MG 24 hr tablet Take 10 mg by mouth daily.     alprazolam (XANAX) 2 MG tablet Take 1 tablet (2 mg total) by mouth in the morning, at noon, in the evening, and at bedtime. 120 tablet 2   baclofen (LIORESAL) 10 MG tablet Take 10 mg by mouth 2 (two) times daily as needed.     clobetasol (TEMOVATE) 0.05 % external solution Apply 1 application topically 2 (two) times daily. 50 mL 9   cycloSPORINE (RESTASIS) 0.05 % ophthalmic emulsion Place 1 drop into both eyes 2 (two) times daily.     EPINEPHrine 0.3 mg/0.3 mL IJ SOAJ injection Inject into the muscle.     gabapentin (NEURONTIN) 600 MG tablet Take 600 mg by mouth 4 (four) times daily.     HUMIRA PEN 40 MG/0.4ML PNKT INJECT '40MG'$  SUBCUTANEOUSLY EVERY 2 WEEKS AS DIRECTED. 2 each 4    lamoTRIgine (LAMICTAL) 200 MG tablet TAKE  (1)  TABLET TWICE A DAY. 60 tablet 2   loratadine (CLARITIN) 10 MG tablet Take 10 mg by mouth daily.     Lurasidone HCl (LATUDA) 120 MG TABS Take 1 tablet (120 mg total) by mouth daily. 90 tablet 2   Multiple Vitamins-Minerals (MENS 50+ MULTI VITAMIN/MIN) TABS Take 1 tablet by mouth every morning.     Omega-3 Fatty Acids (COROMEGA OMEGA 3 SQUEEZE) EMUL Take by mouth.  omeprazole (PRILOSEC) 20 MG capsule Take 20 mg by mouth daily.     Potassium Gluconate 2.5 MEQ TABS Take by mouth.     sertraline (ZOLOFT) 50 MG tablet Take 1 tablet (50 mg total) by mouth daily. 30 tablet 2   traZODone (DESYREL) 100 MG tablet TAKE 1 OR 2 TABLETS AT BEDTIME 60 tablet 2   VEMLIDY 25 MG TABS Take 1 tablet by mouth daily.     No current facility-administered medications for this visit.     Musculoskeletal: Strength & Muscle Tone: within normal limits Gait & Station: normal Patient leans: N/A  Psychiatric Specialty Exam: Review of Systems  Neurological:  Positive for tremors.  All other systems reviewed and are negative.  There were no vitals taken for this visit.There is no height or weight on file to calculate BMI.  General Appearance: Casual and Fairly Groomed  Eye Contact:  Good  Speech:  Clear and Coherent  Volume:  Normal  Mood:  Dysphoric  Affect:  Tearful  Thought Process:  Goal Directed  Orientation:  Full (Time, Place, and Person)  Thought Content: WDL   Suicidal Thoughts:  No  Homicidal Thoughts:  No  Memory:  Immediate;   Good Recent;   Good Remote;   Good  Judgement:  Good  Insight:  Good  Psychomotor Activity:  Tremor  Concentration:  Concentration: Good and Attention Span: Good  Recall:  Good  Fund of Knowledge: Good  Language: Good  Akathisia:  No  Handed:  Right  AIMS (if indicated): not done  Assets:  Communication Skills Desire for Improvement Resilience Social Support Talents/Skills  ADL's:  Intact  Cognition: WNL   Sleep:  Good   Screenings: PHQ2-9    Flowsheet Row Video Visit from 12/01/2020 in [place] Video Visit from 08/24/2020 in [facility] [facility] Video Visit from 06/24/2020 in [place] Counselor from 06/17/2020 in [place] from 01/07/2014 in [place]  PHQ-2 Total Score 1 0 1 4 0  PHQ-9 Total Score -- -- -- 11 --      Flowsheet Row Video Visit from 12/01/2020 in [place] [facility] Video Visit from 08/24/2020 in [place] Counselor from 06/17/2020 in [place] No Risk No Risk Low Risk        Assessment and Plan: This patient is a 50 year old male with a history of bipolar disorder and anxiety.  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  Despite the recent loss of his grandmother his mood has been stable.  He will continue Zoloft 50 mg daily for depression, Lamictal 200 mg twice daily for mood stabilization, lurasidone 120 mg daily for mood stabilization and Xanax 2 mg 4 times daily for anxiety.  He will return to see me in 2 months   [name], MD 12/01/2020, 3:22 PM

## 2020-12-02 ENCOUNTER — Ambulatory Visit (INDEPENDENT_AMBULATORY_CARE_PROVIDER_SITE_OTHER): Payer: Medicare Other | Admitting: Psychiatry

## 2020-12-02 ENCOUNTER — Other Ambulatory Visit: Payer: Self-pay

## 2020-12-02 DIAGNOSIS — F3130 Bipolar disorder, current episode depressed, mild or moderate severity, unspecified: Secondary | ICD-10-CM | POA: Diagnosis not present

## 2020-12-02 NOTE — Progress Notes (Signed)
Virtual Visit via Telephone Note  I connected with [name] on 12/02/20 at 3:12 PM  by telephone and verified that I am speaking with the correct person using two identifiers.  Location: Patient: Car Provider: [facility]     I discussed the limitations, risks, security and privacy concerns of performing an evaluation and management service by telephone and the availability of in person appointments. I also discussed with the patient that there may be a patient responsible charge related to this service. The patient expressed understanding and agreed to proceed.   I provided 23 minutes of non-face-to-face time during this encounter.   [name], LCSW  THERAPIST PROGRESS NOTE     Session Time: Wednesday 12/02/2020 3:12 PM -  3:35 PM      Participation Level: Active     Behavioral Response: Casual/ Alert/sad  Type of Therapy: Individual Therapy  Treatment Goals addressed: Patient wants to improve skills to cope with everyday stress/anxiety, learn how to say no to others and manage money/has ability to handle effectively the full variety of life's anxieties, develop ability to recognize, accept, and cope with feelings of depression  Interventions: CBT and Supportive  Summary: MAXINE NISH is a 50 y.o. male who  is referrred for services by psychiatrist Dr. Harrington Challenger for continuity of care. Patient has history of bipolar disorder and was seeing S. Schneidmiller for therapy. She now is out on medical leave. He reports one psychiatric hospitalization due to suicide attempt in 2009. He received outpatient treatment at [facility]. He eventually saw Ms. Schenidmiller and psychiatrist Dr. Harrington Challenger.  Patient reports he tends to have depressive and missed episodes.  He also reports constant anxiety along with paranoia and delusional thinking at times.  He reports being depressed most of the time and having to force himself out of bed.  Patient last was seen via virtual visit about 2 weeks  ago.  He reports grief and loss issues as his grandmother died on Dec 20, 2020.  He expresses sadness but also states grandmother is not suffering anymore.  He states his emotions are all over the place and says he feels depressed, scared, lost, and guilty.  He reports strong support from his parents, cousins, other extended family, and friends.  He expresses some worry about adjusting to life without his grandmother.  He states he has been trying to cope the best he can. He also reports its been helpful to have his dog. uicidal/Homicidal: No, without intent/plan   Therapist Response: reviewed symptoms, facilitated patient sharing narrative of his grandmother's death, discussed rituals in which he participated, facilitated patient expressing thoughts and feelings, validated and normalized feelings related to grief and loss, encouraged patient to continue using his support system and to be patient with self, will send patient handout stages of grief  Plan: Return again in 2 weeks.     Diagnosis: Axis I: Bipolar, Depressed    Axis II: No diagnosis    [name], LCSW 12/02/2020

## 2020-12-16 ENCOUNTER — Ambulatory Visit (HOSPITAL_COMMUNITY): Payer: Medicare Other | Admitting: Psychiatry

## 2020-12-23 ENCOUNTER — Other Ambulatory Visit: Payer: Self-pay

## 2020-12-23 ENCOUNTER — Ambulatory Visit (INDEPENDENT_AMBULATORY_CARE_PROVIDER_SITE_OTHER): Payer: Medicare Other | Admitting: Psychiatry

## 2020-12-23 DIAGNOSIS — F3130 Bipolar disorder, current episode depressed, mild or moderate severity, unspecified: Secondary | ICD-10-CM
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
## 2021-01-06 ENCOUNTER — Other Ambulatory Visit: Payer: Self-pay

## 2021-01-06 ENCOUNTER — Ambulatory Visit (INDEPENDENT_AMBULATORY_CARE_PROVIDER_SITE_OTHER): Payer: Medicare Other | Admitting: Psychiatry

## 2021-01-06 DIAGNOSIS — F3130 Bipolar disorder, current episode depressed, mild or moderate severity, unspecified: Secondary | ICD-10-CM | POA: Diagnosis not present

## 2021-01-06 NOTE — Progress Notes (Signed)
Virtual Visit via Video Note  I connected with Roger Duran on 01/06/21 at 3:15 PM EDT by a video enabled telemedicine application and verified that I am speaking with the correct person using two identifiers.  Location: Patient: Home Provider:  [place] office    I discussed the limitations of evaluation and management by telemedicine and the availability of in person appointments. The patient expressed understanding and agreed to proceed.   I provided 40  minutes of non-face-to-face time during this encounter.   [name], LCSW   THERAPIST PROGRESS NOTE     Session Time: Wednesday 01/06/2021 3:15 PM - 3:55 PM    Participation Level: Active     Behavioral Response: Casual/ Alert/sad  Type of Therapy: Individual Therapy  Treatment Goals addressed: Patient wants to improve skills to cope with everyday stress/anxiety, learn how to say no to others and manage money/has ability to handle effectively the full variety of life's anxieties, develop ability to recognize, accept, and cope with feelings of depression  Interventions: CBT and Supportive  Summary: AGGIE HOUSEHOLDER is a 50 y.o. male who  is referrred for services by psychiatrist Dr. Harrington Challenger for continuity of care. Patient has history of bipolar disorder and was seeing S. Schneidmiller for therapy. She now is out on medical leave. He reports one psychiatric hospitalization due to suicide attempt in 2009. He received outpatient treatment at [facility]. He eventually saw Ms. Schenidmiller and psychiatrist Dr. Harrington Challenger.  Patient reports he tends to have depressive and missed episodes.  He also reports constant anxiety along with paranoia and delusional thinking at times.  He reports being depressed most of the time and having to force himself out of bed.  Patient last was seen via virtual visit about 2 weeks ago.  He reports continued grief and loss issues as his grandmother died on 11-27-20.  He reports symptoms of  depression and anxiety.  He is experiencing sadness, depressed mood, panic attacks, excessive worry, tearfulness, and sleep difficulty.  He reports worry about what his life is going to be in the future and states worrying about everything in general as well.  He also reports financial stress as he has accumulated more debt due to difficulty managing money.  He reports continued strong support from his mother as well as other family members.  uicidal/Homicidal: No, without intent/plan   Therapist Response: reviewed symptoms, facilitated patient expressing thoughts and feelings related to grief and loss, validated and normalized feelings, also assisted patient identify how his life is changed since grandmother's death, encouraged him to use support system, began to discuss the four tasks of mourning, stressors, assisted patient identify triggers of anxiety, reviewed rationale for practicing deep breathing 5 to 10 minutes twice per day in addition to using as an intervention, also assisted patient identify ways to improve sleep hygiene, developed plan with patient to develop consistent going to bedtime and avoid falling asleep in the chair   Plan: Return again in 2 weeks.     Diagnosis: Axis I: Bipolar, Depressed    Axis II: No diagnosis    [name], LCSW 01/06/2021

## 2021-01-20 ENCOUNTER — Other Ambulatory Visit: Payer: Self-pay

## 2021-01-20 ENCOUNTER — Ambulatory Visit (INDEPENDENT_AMBULATORY_CARE_PROVIDER_SITE_OTHER): Payer: Medicare Other | Admitting: Psychiatry

## 2021-01-20 DIAGNOSIS — F3130 Bipolar disorder, current episode depressed, mild or moderate severity, unspecified: Secondary | ICD-10-CM

## 2021-01-20 NOTE — Progress Notes (Signed)
Virtual Visit via Telephone Note  I connected with Roger Duran on 01/20/21 at 3:12 PM by telephone and verified that I am speaking with the correct person using two identifiers.  Location: Patient: Home Provider: [place] office    I discussed the limitations, risks, security and privacy concerns of performing an evaluation and management service by telephone and the availability of in person appointments. I also discussed with the patient that there may be a patient responsible charge related to this service. The patient expressed understanding and agreed to proceed.    I provided 48 minutes of non-face-to-face time during this encounter.   [name], LCSW   THERAPIST PROGRESS NOTE     Session Time: Wednesday 01/20/2021 3:12 PM - 4:00 PM    Participation Level: Active     Behavioral Response: Casual/ Alert/sad  Type of Therapy: Individual Therapy  Treatment Goals addressed: Patient wants to improve skills to cope with everyday stress/anxiety, learn how to say no to others and manage money/has ability to handle effectively the full variety of life's anxieties, develop ability to recognize, accept, and cope with feelings of depression  Interventions: CBT and Supportive  Summary: Roger Duran is a 50 y.o. male who  is referrred for services by psychiatrist Dr. Harrington Challenger for continuity of care. Patient has history of bipolar disorder and was seeing S. Schneidmiller for therapy. She now is out on medical leave. He reports one psychiatric hospitalization due to suicide attempt in 2009. He received outpatient treatment at [facility]. He eventually saw Ms. Schenidmiller and psychiatrist Dr. Harrington Challenger.  Patient reports he tends to have depressive and missed episodes.  He also reports constant anxiety along with paranoia and delusional thinking at times.  He reports being depressed most of the time and having to force himself out of bed.  Patient last was seen via virtual  visit about 2 weeks ago.  Patient reports sadness, depressed mood, and tearfulness as he continues to experience grief and loss issues related to the death of his grandmother.  He expresses regret and feelings of guilt.  He reports being lonely and expresses disappointment that his brother and other people do not visit.  He is experiencing some pleasurable moments.  He reports enjoying a recent trip to the beach with his mother.  He is looking forward to going to [place] this weekend with his best friend.  He continues to report strong support from his parents and says he goes to their house every evening for dinner and visits  with them for about 4 to 5 hours.  He reports implementing plan developed in session to improve sleep hygiene.  He has avoided falling asleep in the chair and  reports having structured routine to go to bed.  He reports sleeping about 6 to 7 hours per night.  He reports feeling more rested and a little more energetic.  He also has improved daily routine and structure performing various tasks and light household projects.  Patient remains hopeful about upcoming surgery next month to address tremors.    uicidal/Homicidal: No, without intent/plan   Therapist Response: reviewed symptoms, praised and reinforced patient's improved sleep hygiene/improved daily structure, routine, discussed effects, facilitated patient expressing thoughts and feelings related to grief and loss,validated and normalized feelings, assisted patient examine thoughts evoking inappropriate guilt and replace with more helpful thoughts, discussed the first and second tasks of mourning and patient's experience with each, assisted patient identify ways to improve assertiveness skills to express his  needs.   Plan: Return again in 2 weeks.     Diagnosis: Axis I: Bipolar, Depressed    Axis II: No diagnosis    [name], LCSW 01/20/2021

## 2021-01-27 ENCOUNTER — Other Ambulatory Visit: Payer: Self-pay

## 2021-01-27 ENCOUNTER — Encounter (HOSPITAL_COMMUNITY): Payer: Self-pay | Admitting: Psychiatry

## 2021-01-27 ENCOUNTER — Telehealth (INDEPENDENT_AMBULATORY_CARE_PROVIDER_SITE_OTHER): Payer: Medicare Other | Admitting: Psychiatry

## 2021-01-27 DIAGNOSIS — F3130 Bipolar disorder, current episode depressed, mild or moderate severity, unspecified: Secondary | ICD-10-CM

## 2021-01-27 MED ORDER — ALPRAZOLAM 2 MG PO TABS: 2.0000 mg | ORAL_TABLET | Freq: Four times a day (QID) | ORAL | 2 refills | Status: DC

## 2021-01-27 MED ORDER — LAMOTRIGINE 200 MG PO TABS: ORAL_TABLET | ORAL | 2 refills | Status: DC

## 2021-01-27 MED ORDER — LATUDA 120 MG PO TABS: 120.0000 mg | ORAL_TABLET | Freq: Every day | ORAL | 2 refills | Status: DC

## 2021-01-27 MED ORDER — TRAZODONE HCL 100 MG PO TABS: ORAL_TABLET | ORAL | 2 refills | Status: DC

## 2021-01-27 MED ORDER — SERTRALINE HCL 50 MG PO TABS: 50.0000 mg | ORAL_TABLET | Freq: Every day | ORAL | 2 refills | Status: DC

## 2021-01-27 NOTE — Progress Notes (Signed)
Virtual Visit via Telephone Note  I connected with [name] on 01/27/21 at  3:00 PM EDT by telephone and verified that I am speaking with the correct person using two identifiers.  Location: Patient: home Provider: home office   I discussed the limitations, risks, security and privacy concerns of performing an evaluation and management service by telephone and the availability of in person appointments. I also discussed with the patient that there may be a patient responsible charge related to this service. The patient expressed understanding and agreed to proceed.     I discussed the assessment and treatment plan with the patient. The patient was provided an opportunity to ask questions and all were answered. The patient agreed with the plan and demonstrated an understanding of the instructions.   The patient was advised to call back or seek an in-person evaluation if the symptoms worsen or if the condition fails to improve as anticipated.  I provided 18 minutes of non-face-to-face time during this encounter.   [name], MD  [facility] MD/PA/NP OP Progress Note  01/27/2021 3:21 PM [name]  MRN:  [id number]  Chief Complaint:  Chief Complaint   Anxiety; Depression; Manic Behavior; Follow-up    HPI: This patient is a 50 year old single white male lives with alone in [place].  He is on disability for bipolar disorder.  The patient returns for follow-up after 2 months.  For the most part he is doing okay.  He is still mourning the loss of his great grandmother who died at the end of 13-Dec-2022.  He is taking care of her for 11 years.  He is also dealing with disabling essential tremor.  Its very hard for him to eat and getting more difficult for him to walk.  He is having brain surgery next week to have an stimulator implanted with a follow-up surgery later in the month.  He is actually looking forward to this because he is tired of having the severe tremor.  Overall his mood  has been stable.  He denies being depressed.  He denies significant anxiety.  He is sleeping well.  He is not having any manic symptoms such as racing thoughts irritability anger or impulsivity.  He denies thoughts of self-harm or suicide. Visit Diagnosis:    ICD-10-CM   1. Bipolar I disorder, most recent episode depressed ([place])  F31.30       Past Psychiatric History: Hospitalization in 2009 after suicide attempt  Past Medical History:  Past Medical History:  Diagnosis Date   Anxiety    Bipolar 1 disorder ([place])    Depression    Mania ([place])    Psoriasis     Past Surgical History:  Procedure Laterality Date   APPENDECTOMY     back procedures     COLONOSCOPY N/A 11/14/2014   Procedure: COLONOSCOPY;  Surgeon: Rogene [place], MD;  Location: [facility];  Service: Endoscopy;  Laterality: N/A;  11:10 - moved to 8:30 - Ann to notify   TONSILLECTOMY     WRIST SURGERY      Family Psychiatric History: see below  Family History:  Family History  Problem Relation Age of Onset   Hyperlipidemia Mother    Heart disease Father    Anxiety disorder Father    Bipolar disorder Maternal Grandfather     Social History:  Social History   Socioeconomic History   Marital status: Single    Spouse name: Not on file   Number of children: Not  on file   Years of education: Not on file   Highest education level: Not on file  Occupational History   Not on file  Tobacco Use   Smoking status: Former    Types: Cigarettes, E-cigarettes    Quit date: 11/21/2019    Years since quitting: 1.1   Smokeless tobacco: Former  Scientific laboratory technician Use: Not on file  Substance and Sexual Activity   Alcohol use: No    Alcohol/week: 0.0 standard drinks    Comment: rarely   Drug use: No    Comment: 1995-2000 PER PT HE DID A LOT OF COCAINE AND PILLS AND DRANK A LOT   Sexual activity: Not Currently    Partners: Male    Birth control/protection: Condom  Other Topics Concern   Not on file  Social  History Narrative   Not on file   Social Determinants of Health   Financial Resource Strain: Not on file  Food Insecurity: Not on file  Transportation Needs: Not on file  Physical Activity: Not on file  Stress: Not on file  Social Connections: Not on file    Allergies:  Allergies  Allergen Reactions   Apple Itching and Swelling    Throat and mouth itching and swelling Throat and mouth itching and swelling   Bee Venom Anaphylaxis and Other (See Comments)    Weak, shaky, dizzy. Weak, shaky, dizzy.   Morphine Palpitations   Aspirin Nausea And Vomiting   Sulfa Antibiotics Nausea And Vomiting   Dicyclomine Other (See Comments)    Aka Dicyclomine Aka Dicyclomine   Other Other (See Comments)    Aka Dicyclomine   Penicillins Itching and Rash    Metabolic Disorder Labs: No results found for: HGBA1C, MPG No results found for: PROLACTIN No results found for: CHOL, TRIG, HDL, CHOLHDL, VLDL, LDLCALC No results found for: TSH  Therapeutic Level Labs: Lab Results  Component Value Date   LITHIUM 1.10 10/15/2014   No results found for: VALPROATE No components found for:  CBMZ  Current Medications: Current Outpatient Medications  Medication Sig Dispense Refill   acetaminophen (TYLENOL) 500 MG tablet Take by mouth.     acyclovir (ZOVIRAX) 400 MG tablet TAKE  (1)  TABLET  THREE TIMES DAILY AS NEEDED. 60 tablet 10   alfuzosin (UROXATRAL) 10 MG 24 hr tablet Take 10 mg by mouth daily.     alprazolam (XANAX) 2 MG tablet Take 1 tablet (2 mg total) by mouth in the morning, at noon, in the evening, and at bedtime. 120 tablet 2   baclofen (LIORESAL) 10 MG tablet Take 10 mg by mouth 2 (two) times daily as needed.     clobetasol (TEMOVATE) 0.05 % external solution Apply 1 application topically 2 (two) times daily. 50 mL 9   cycloSPORINE (RESTASIS) 0.05 % ophthalmic emulsion Place 1 drop into both eyes 2 (two) times daily.     EPINEPHrine 0.3 mg/0.3 mL IJ SOAJ injection Inject into the  muscle.     gabapentin (NEURONTIN) 600 MG tablet Take 600 mg by mouth 4 (four) times daily.     HUMIRA PEN 40 MG/0.4ML PNKT INJECT 40MG  SUBCUTANEOUSLY EVERY 2 WEEKS AS DIRECTED. 2 each 4   lamoTRIgine (LAMICTAL) 200 MG tablet TAKE  (1)  TABLET TWICE A DAY. 60 tablet 2   loratadine (CLARITIN) 10 MG tablet Take 10 mg by mouth daily.     Lurasidone HCl (LATUDA) 120 MG TABS Take 1 tablet (120 mg total) by mouth daily. [place]  tablet 2   Multiple Vitamins-Minerals (MENS 50+ MULTI VITAMIN/MIN) TABS Take 1 tablet by mouth every morning.     Omega-3 Fatty Acids (COROMEGA OMEGA 3 SQUEEZE) EMUL Take by mouth.     omeprazole (PRILOSEC) 20 MG capsule Take 20 mg by mouth daily.     Potassium Gluconate 2.5 MEQ TABS Take by mouth.     sertraline (ZOLOFT) 50 MG tablet Take 1 tablet (50 mg total) by mouth daily. 30 tablet 2   traZODone (DESYREL) 100 MG tablet TAKE 1 OR 2 TABLETS AT BEDTIME 60 tablet 2   VEMLIDY 25 MG TABS Take 1 tablet by mouth daily.     No current facility-administered medications for this visit.     Musculoskeletal: Strength & Muscle Tone: na Gait & Station: na Patient leans: N/A  Psychiatric Specialty Exam: Review of Systems  Neurological:  Positive for tremors.  All other systems reviewed and are negative.  There were no vitals taken for this visit.There is no height or weight on file to calculate BMI.  General Appearance: NA  Eye Contact:  NA  Speech:  Clear and Coherent  Volume:  Normal  Mood:  Euthymic  Affect:  NA  Thought Process:  Goal Directed  Orientation:  Full (Time, Place, and Person)  Thought Content: Rumination   Suicidal Thoughts:  No  Homicidal Thoughts:  No  Memory:  Immediate;   Good Recent;   Good Remote;   Good  Judgement:  Good  Insight:  Good  Psychomotor Activity:  Tremor  Concentration:  Concentration: Good and Attention Span: Good  Recall:  Good  Fund of Knowledge: Good  Language: Good  Akathisia:  No  Handed:  Right  AIMS (if indicated): not  done  Assets:  Communication Skills Desire for Improvement Resilience Social Support Talents/Skills  ADL's:  Intact  Cognition: WNL  Sleep:  Good   Screenings: PHQ2-9    Flowsheet Row Video Visit from 01/27/2021 in [place] from 12/23/2020 in [place] [facility] Video Visit from 12/01/2020 in [place] [facility] Video Visit from 08/24/2020 in [place] [facility] Video Visit from 06/24/2020 in [place] [facility]  PHQ-2 Total Score 0 3 1 0 1  PHQ-9 Total Score 2 12 -- -- --      Flowsheet Row Video Visit from 01/27/2021 in [place] from 12/23/2020 in [place] [facility] Video Visit from 12/01/2020 in [place] [facility]  C-SSRS RISK CATEGORY Error: Q7 should not be populated when Q6 is No Low Risk No Risk        Assessment and Plan: Is a 50 year old male with a history of bipolar disorder and anxiety.  Despite his recent loss of his grandmother he is doing well and anticipating good result from his surgery.  He will continue Zoloft 50 mg daily for depression, Lamictal 200 mg twice daily for mood stabilization, lurasidone 120 mg daily for mood stabilization Xanax 2 mg 4 times daily for anxiety and trazodone 100 to 200 mg at bedtime for sleep.  He will return to see me in 2 months   [name], MD 01/27/2021, 3:21 PM

## 2021-02-03 ENCOUNTER — Other Ambulatory Visit: Payer: Self-pay

## 2021-02-03 ENCOUNTER — Ambulatory Visit (INDEPENDENT_AMBULATORY_CARE_PROVIDER_SITE_OTHER): Payer: Medicare Other | Admitting: Psychiatry

## 2021-02-03 DIAGNOSIS — F3130 Bipolar disorder, current episode depressed, mild or moderate severity, unspecified: Secondary | ICD-10-CM

## 2021-02-03 NOTE — Progress Notes (Signed)
Virtual Visit via Video Note  I connected with Roger Duran on 02/03/21 at 3:15 PM EDT  by a video enabled telemedicine application and verified that I am speaking with the correct person using two identifiers.  Location: Patient: Home Provider:  [place] office    I discussed the limitations of evaluation and management by telemedicine and the availability of in person appointments. The patient expressed understanding and agreed to proceed.   I provided 38 minutes of non-face-to-face time during this encounter.   [name], LCSW   THERAPIST PROGRESS NOTE     Session Time: Wednesday 02/03/2021 3:15 PM - 3:53 PM    Participation Level: Active     Behavioral Response: Casual/ Alert/tearful at times  Type of Therapy: Individual Therapy  Treatment Goals addressed: Patient wants to improve skills to cope with everyday stress/anxiety, learn how to say no to others and manage money/has ability to handle effectively the full variety of life's anxieties, develop ability to recognize, accept, and cope with feelings of depression  Interventions: CBT and Supportive  Summary: Roger Duran is a 50 y.o. male who  is referrred for services by psychiatrist Dr. Harrington Challenger for continuity of care. Patient has history of bipolar disorder and was seeing S. Schneidmiller for therapy. She now is out on medical leave. He reports one psychiatric hospitalization due to suicide attempt in 2009. He received outpatient treatment at [facility]. He eventually saw Ms. Schenidmiller and psychiatrist Dr. Harrington Challenger.  Patient reports he tends to have depressive and missed episodes.  He also reports constant anxiety along with paranoia and delusional thinking at times.  He reports being depressed most of the time and having to force himself out of bed.  Patient last was seen via virtual visit about 2 weeks ago.  Patient states doing better since last session.  He reports enjoying a recent weekend trip to  [place] with his best friend.  He also reports enjoying celebrating his birthday this past Monday with his family.  He continues to miss his grandmother and reports being very lonely at night.  He expresses sadness but says he does not cry as much and reports no crying spells since last session.  He reports trying to maintain involvement in activities during the day to manage his sadness.  He continues to visit his parents every evening and reports continued strong support from them especially his mother.  Patient is looking forward to upcoming surgery this Friday in hopes of tremors being eliminated.   uicidal/Homicidal: No, without intent/plan   Therapist Response: reviewed symptoms, praised and reinforced patient's continued efforts regarding improved sleep hygiene/improved daily structure, facilitated patient expressing thoughts and feelings related to grief and loss, validated and normalized feelings, introduced the third task of mourning, also facilitated patient expressing thoughts and feelings about upcoming surgery, encouraged patient to continue using support system  Plan: Patient will call and schedule appointment after surgery  Diagnosis: Axis I: Bipolar, Depressed    Axis II: No diagnosis    [name], LCSW 02/03/2021

## 2021-02-18 ENCOUNTER — Other Ambulatory Visit: Payer: Self-pay | Admitting: Physician Assistant

## 2021-02-18 DIAGNOSIS — L400 Psoriasis vulgaris: Secondary | ICD-10-CM

## 2021-03-03 ENCOUNTER — Ambulatory Visit (INDEPENDENT_AMBULATORY_CARE_PROVIDER_SITE_OTHER): Payer: Medicare Other | Admitting: Psychiatry

## 2021-03-03 ENCOUNTER — Other Ambulatory Visit: Payer: Self-pay

## 2021-03-03 DIAGNOSIS — F3130 Bipolar disorder, current episode depressed, mild or moderate severity, unspecified: Secondary | ICD-10-CM | POA: Diagnosis not present

## 2021-03-03 NOTE — Progress Notes (Signed)
Virtual Visit via Video Note  I connected with Roger Duran on 03/03/21 at 3:10 PM EDT by a video enabled telemedicine application and verified that I am speaking with the correct person using two identifiers.  Location: Patient: Home Provider: [place] office    I discussed the limitations of evaluation and management by telemedicine and the availability of in person appointments. The patient expressed understanding and agreed to proceed.   I provided 43 minutes of non-face-to-face time during this encounter.   [name], LCSW   THERAPIST PROGRESS NOTE     Session Time: Wednesday 03/03/2021 3:10 PM - 3:53 PM    Participation Level: Active     Behavioral Response: Casual/ Alert/  Type of Therapy: Individual Therapy  Treatment Goals addressed: Patient wants to improve skills to cope with everyday stress/anxiety, learn how to say no to others and manage money/has ability to handle effectively the full variety of life's anxieties, develop ability to recognize, accept, and cope with feelings of depression  Interventions: CBT and Supportive  Summary: Roger Duran is a 50 y.o. male who  is referrred for services by psychiatrist Dr. Harrington Challenger for continuity of care. Patient has history of bipolar disorder and was seeing S. Schneidmiller for therapy. She now is out on medical leave. He reports one psychiatric hospitalization due to suicide attempt in 2009. He received outpatient treatment at [facility]. He eventually saw Ms. Schenidmiller and psychiatrist Dr. Harrington Challenger.  Patient reports he tends to have depressive and missed episodes.  He also reports constant anxiety along with paranoia and delusional thinking at times.  He reports being depressed most of the time and having to force himself out of bed.  Patient last was seen via virtual visit about 4 weeks ago.  Patient has had 2 surgeries to address tremors and reports that surgeries were successful.  He now is waiting  for a follow-up with neurologist on March 09, 2021 to calibrate the settings on his battery pack.  Once this is completed, tremors should be eliminated per patient's report.  He reports the first surgery was 6 hours and was very difficult as he was awake during the procedure.  He reports it was traumatic.  He reports the first hospital stay was difficult but doing much better with the second surgery and second hospital stay.  He now is residing with his parents temporarily and they are providing care.  Patient reports experiencing some dizziness and headaches but says this is common with the surgeries he has had.  He states missing driving as he will not be able to drive until December.  He reports depression is controlled as he does have support from his family and he is not alone.  However, he reports thoughts of being a burden to his family although they have given him no indication that this is the case.  They also have reassured him he can stay there as long as he wants per patient's report. uicidal/Homicidal: No, without intent/plan   Therapist Response: reviewed symptoms, facilitated patient expressing thoughts and feelings about surgery, validated feelings, discussed being patient with self/pacing self/and using coping statements, assisted patient identify/challenge/and replace unhelpful thoughts about being a burden with more helpful thoughts  Plan: Patient will call and schedule appointment after surgery  Diagnosis: Axis I: Bipolar, Depressed    Axis II: No diagnosis    [name], LCSW 03/03/2021

## 2021-03-17 ENCOUNTER — Other Ambulatory Visit: Payer: Self-pay

## 2021-03-17 ENCOUNTER — Ambulatory Visit (HOSPITAL_COMMUNITY): Payer: Medicare Other | Admitting: Psychiatry

## 2021-03-29 ENCOUNTER — Encounter (HOSPITAL_COMMUNITY): Payer: Self-pay | Admitting: Psychiatry

## 2021-03-29 ENCOUNTER — Telehealth (INDEPENDENT_AMBULATORY_CARE_PROVIDER_SITE_OTHER): Payer: Medicare Other | Admitting: Psychiatry

## 2021-03-29 ENCOUNTER — Other Ambulatory Visit: Payer: Self-pay

## 2021-03-29 DIAGNOSIS — F3130 Bipolar disorder, current episode depressed, mild or moderate severity, unspecified: Secondary | ICD-10-CM | POA: Diagnosis not present

## 2021-03-29 MED ORDER — LATUDA 120 MG PO TABS: 120.0000 mg | ORAL_TABLET | Freq: Every day | ORAL | 2 refills | Status: DC

## 2021-03-29 MED ORDER — SERTRALINE HCL 50 MG PO TABS: 50.0000 mg | ORAL_TABLET | Freq: Every day | ORAL | 2 refills | Status: DC

## 2021-03-29 MED ORDER — ALPRAZOLAM 2 MG PO TABS: 2.0000 mg | ORAL_TABLET | Freq: Four times a day (QID) | ORAL | 2 refills | Status: DC

## 2021-03-29 MED ORDER — TRAZODONE HCL 100 MG PO TABS: ORAL_TABLET | ORAL | 2 refills | Status: DC

## 2021-03-29 MED ORDER — LAMOTRIGINE 200 MG PO TABS: ORAL_TABLET | ORAL | 2 refills | Status: DC

## 2021-03-29 NOTE — Progress Notes (Signed)
Virtual Visit via Video Note  I connected with Roger Duran on 03/29/21 at  3:00 PM EST by a video enabled telemedicine application and verified that I am speaking with the correct person using two identifiers.  Location: Patient: home Provider: home office   I discussed the limitations of evaluation and management by telemedicine and the availability of in person appointments. The patient expressed understanding and agreed to proceed.     I discussed the assessment and treatment plan with the patient. The patient was provided an opportunity to ask questions and all were answered. The patient agreed with the plan and demonstrated an understanding of the instructions.   The patient was advised to call back or seek an in-person evaluation if the symptoms worsen or if the condition fails to improve as anticipated.  I provided 20 minutes of non-face-to-face time during this encounter.   [name], MD  [facility] MD/PA/NP OP Progress Note  03/29/2021 3:13 PM Roger Duran  MRN:  [id number]  Chief Complaint:  Chief Complaint   Anxiety; Depression; Manic Behavior; Follow-up    HPI: This patient is a 50 year old single white male lives alone in [place].  He is on disability for bipolar disorder.  The patient returns for follow-up after 2 months.  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  He had the settings adjusted earlier this month and for a while he was doing really well.  However he states of the last few days the tremor has returned and he is getting quite discouraged.  Fortunately he is going back to the neurologist who does the settings tomorrow and hopefully things get back to normal.  In terms of mood he states he has been stable.  He is a little bit down because he is discouraged about the stimulator not working as he expected.  However he denies any manic symptoms thoughts of self-harm or suicidal ideation. Visit Diagnosis:     ICD-10-CM   1. Bipolar I disorder, most recent episode depressed ([place])  F31.30       Past Psychiatric History: Hospitalization in 2009 after suicide attempt  Past Medical History:  Past Medical History:  Diagnosis Date   Anxiety    Bipolar 1 disorder ([place])    Depression    Mania ([place])    Psoriasis     Past Surgical History:  Procedure Laterality Date   APPENDECTOMY     back procedures     COLONOSCOPY N/A 11/14/2014   Procedure: COLONOSCOPY;  Surgeon: Rogene Houston, MD;  Location: [facility];  Service: Endoscopy;  Laterality: N/A;  11:10 - moved to 8:30 - Ann to notify   TONSILLECTOMY     WRIST SURGERY      Family Psychiatric History: See below  Family History:  Family History  Problem Relation Age of Onset   Hyperlipidemia Mother    Heart disease Father    Anxiety disorder Father    Bipolar disorder Maternal Grandfather     Social History:  Social History   Socioeconomic History   Marital status: Single    Spouse name: Not on file   Number of children: Not on file   Years of education: Not on file   Highest education level: Not on file  Occupational History   Not on file  Tobacco Use   Smoking status: Former    Types: Cigarettes, E-cigarettes    Quit date: 11/21/2019    Years since quitting: 1.3  Smokeless tobacco: Former  Scientific laboratory technician Use: Not on file  Substance and Sexual Activity   Alcohol use: No    Alcohol/week: 0.0 standard drinks    Comment: rarely   Drug use: No    Comment: 1995-2000 PER PT HE DID A LOT OF COCAINE AND PILLS AND DRANK A LOT   Sexual activity: Not Currently    Partners: Male    Birth control/protection: Condom  Other Topics Concern   Not on file  Social History Narrative   Not on file   Social Determinants of Health   Financial Resource Strain: Not on file  Food Insecurity: Not on file  Transportation Needs: Not on file  Physical Activity: Not on file  Stress: Not on file  Social Connections: Not on  file    Allergies:  Allergies  Allergen Reactions   Apple Itching and Swelling    Throat and mouth itching and swelling Throat and mouth itching and swelling   Bee Venom Anaphylaxis and Other (See Comments)    Weak, shaky, dizzy. Weak, shaky, dizzy.   Morphine Palpitations   Aspirin Nausea And Vomiting   Sulfa Antibiotics Nausea And Vomiting   Dicyclomine Other (See Comments)    Aka Dicyclomine Aka Dicyclomine   Other Other (See Comments)    Aka Dicyclomine   Penicillins Itching and Rash    Metabolic Disorder Labs: No results found for: HGBA1C, MPG No results found for: PROLACTIN No results found for: CHOL, TRIG, HDL, CHOLHDL, VLDL, LDLCALC No results found for: TSH  Therapeutic Level Labs: Lab Results  Component Value Date   LITHIUM 1.10 10/15/2014   No results found for: VALPROATE No components found for:  CBMZ  Current Medications: Current Outpatient Medications  Medication Sig Dispense Refill   acetaminophen (TYLENOL) 500 MG tablet Take by mouth.     acyclovir (ZOVIRAX) 400 MG tablet TAKE  (1)  TABLET  THREE TIMES DAILY AS NEEDED. 60 tablet 10   alfuzosin (UROXATRAL) 10 MG 24 hr tablet Take 10 mg by mouth daily.     alprazolam (XANAX) 2 MG tablet Take 1 tablet (2 mg total) by mouth in the morning, at noon, in the evening, and at bedtime. 120 tablet 2   baclofen (LIORESAL) 10 MG tablet Take 10 mg by mouth 2 (two) times daily as needed.     clobetasol (TEMOVATE) 0.05 % external solution Apply 1 application topically 2 (two) times daily. 50 mL 9   cycloSPORINE (RESTASIS) 0.05 % ophthalmic emulsion Place 1 drop into both eyes 2 (two) times daily.     EPINEPHrine 0.3 mg/0.3 mL IJ SOAJ injection Inject into the muscle.     gabapentin (NEURONTIN) 600 MG tablet Take 600 mg by mouth 4 (four) times daily.     HUMIRA PEN 40 MG/0.4ML PNKT INJECT 40MG  SUBCUTANEOUSLY EVERY 2 WEEKS AS DIRECTED. 2 each 10   lamoTRIgine (LAMICTAL) 200 MG tablet TAKE  (1)  TABLET TWICE A DAY. 60  tablet 2   loratadine (CLARITIN) 10 MG tablet Take 10 mg by mouth daily.     Lurasidone HCl (LATUDA) 120 MG TABS Take 1 tablet (120 mg total) by mouth daily. 90 tablet 2   Multiple Vitamins-Minerals (MENS 50+ MULTI VITAMIN/MIN) TABS Take 1 tablet by mouth every morning.     Omega-3 Fatty Acids (COROMEGA OMEGA 3 SQUEEZE) EMUL Take by mouth.     omeprazole (PRILOSEC) 20 MG capsule Take 20 mg by mouth daily.     Potassium Gluconate 2.5  MEQ TABS Take by mouth.     sertraline (ZOLOFT) 50 MG tablet Take 1 tablet (50 mg total) by mouth daily. 30 tablet 2   traZODone (DESYREL) 100 MG tablet TAKE 1 OR 2 TABLETS AT BEDTIME 60 tablet 2   VEMLIDY 25 MG TABS Take 1 tablet by mouth daily.     No current facility-administered medications for this visit.     Musculoskeletal: Strength & Muscle Tone: within normal limits Gait & Station: normal Patient leans: N/A  Psychiatric Specialty Exam: Review of Systems  Neurological:  Positive for tremors and weakness.  All other systems reviewed and are negative.  There were no vitals taken for this visit.There is no height or weight on file to calculate BMI.  General Appearance: Casual and Fairly Groomed  Eye Contact:  Good  Speech:  Clear and Coherent  Volume:  Normal  Mood:  Anxious and Euthymic  Affect:  Appropriate and Congruent  Thought Process:  Goal Directed  Orientation:  Full (Time, Place, and Person)  Thought Content: Rumination   Suicidal Thoughts:  No  Homicidal Thoughts:  No  Memory:  Immediate;   Good Recent;   Good Remote;   Good  Judgement:  Good  Insight:  Good  Psychomotor Activity:  Tremor  Concentration:  Concentration: Good and Attention Span: Good  Recall:  Good  Fund of Knowledge: Good  Language: Good  Akathisia:  No  Handed:  Right  AIMS (if indicated): not done  Assets:  Communication Skills Desire for Improvement Resilience Social Support Talents/Skills  ADL's:  Intact  Cognition: WNL  Sleep:  Good    Screenings: PHQ2-9    Flowsheet Row Video Visit from 03/29/2021 in [place] Video Visit from 01/27/2021 in [place] from 12/23/2020 in [place] [facility] Video Visit from 12/01/2020 in [place] [facility] Video Visit from 08/24/2020 in [facility]  PHQ-2 Total Score 1 0 3 1 0  PHQ-9 Total Score -- 2 12 -- --      Flowsheet Row Video Visit from 01/27/2021 in [place] from 12/23/2020 in [place] [facility] Video Visit from 12/01/2020 in [place] [facility]  C-SSRS RISK CATEGORY Error: Q7 should not be populated when Q6 is No Low Risk No Risk        Assessment and Plan: Patient is a 50 year old male with history of bipolar disorder and anxiety.  Despite the recent surgery he is doing fairly well in terms of mood.  He will continue Zoloft 50 mg daily for depression, Lamictal 200 mg twice daily for mood stabilization, lurasidone 120 mg daily for mood stabilization, Xanax 2 mg 4 times daily for anxiety and trazodone 100 to 200 mg at bedtime for sleep.  He will return to see me in 6 weeks   [name], MD 03/29/2021, 3:13 PM

## 2021-03-31 ENCOUNTER — Ambulatory Visit (HOSPITAL_COMMUNITY): Payer: Medicare Other | Admitting: Psychiatry

## 2021-04-01 ENCOUNTER — Other Ambulatory Visit: Payer: Self-pay

## 2021-04-01 ENCOUNTER — Ambulatory Visit (INDEPENDENT_AMBULATORY_CARE_PROVIDER_SITE_OTHER): Payer: Medicare Other | Admitting: Psychiatry

## 2021-04-01 DIAGNOSIS — F3130 Bipolar disorder, current episode depressed, mild or moderate severity, unspecified: Secondary | ICD-10-CM

## 2021-04-01 NOTE — Progress Notes (Signed)
Virtual Visit via Video Note  I connected with Roger Duran on 04/01/21 at 3:07 PM EST by a video enabled telemedicine application and verified that I am speaking with the correct person using two identifiers.  Location: Patient: Home Provider: [place] office    I discussed the limitations of evaluation and management by telemedicine and the availability of in person appointments. The patient expressed understanding and agreed to proceed.   I provided 46 minutes of non-face-to-face time during this encounter.   [name], LCSW  THERAPIST PROGRESS NOTE     Session Time: Thursday 04/01/2021 3:07 PM - 3:51 PM    Participation Level: Active     Behavioral Response: Casual/ Alert/  Type of Therapy: Individual Therapy  Treatment Goals addressed: Patient wants to improve skills to cope with everyday stress/anxiety, learn how to say no to others and manage money/has ability to handle effectively the full variety of life's anxieties, develop ability to recognize, accept, and cope with feelings of depression  Interventions: CBT and Supportive  Summary: Roger Duran is a 50 y.o. male who  is referrred for services by psychiatrist Dr. Harrington Challenger for continuity of care. Patient has history of bipolar disorder and was seeing S. Schneidmiller for therapy. She now is out on medical leave. He reports one psychiatric hospitalization due to suicide attempt in 2009. He received outpatient treatment at [facility]. He eventually saw Ms. Schenidmiller and psychiatrist Dr. Harrington Challenger.  Patient reports he tends to have depressive and missed episodes.  He also reports constant anxiety along with paranoia and delusional thinking at times.  He reports being depressed most of the time and having to force himself out of bed.  Patient last was seen via virtual visit about 4 weeks ago.  Patient reports feeling better since last session.  He no longer is experiencing the tremors as the stimulator has  been calibrated.  He reports having some slurred speech but is okay with this.  He is very pleased with his functioning now.  He has moved back to his home and has started a regular routine.  He continues to have dinner with his parents daily and reports enjoying this.  This also gives him the opportunity to have more contact with his brother and niece and nephew.  He also has resumed driving and is feeling independent again.  He continues to deal with some grief and loss issues regarding his grandmother and still reports being lonely at home at times.  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  He is excited about attending an event in December to reconnect with old friends.  However he expresses some anxiety about this.   Suicidal/Homicidal: No, without intent/plan   Therapist Response: reviewed symptoms, praised and reinforced patient's efforts to have improved daily routine and structure, facilitated patient expressing thoughts and feelings, assisted patient began to identify changes in his life since his grandmother's death, also began to discuss possible new opportunities for patient   Plan: Return in 2 weeks  Diagnosis: Axis I: Bipolar, Depressed    Axis II: No diagnosis    [name], LCSW 04/01/2021

## 2021-04-07 ENCOUNTER — Other Ambulatory Visit: Payer: Self-pay | Admitting: Physician Assistant

## 2021-04-14 ENCOUNTER — Ambulatory Visit (INDEPENDENT_AMBULATORY_CARE_PROVIDER_SITE_OTHER): Payer: Medicare Other | Admitting: Psychiatry

## 2021-04-14 ENCOUNTER — Other Ambulatory Visit: Payer: Self-pay

## 2021-04-14 DIAGNOSIS — F3130 Bipolar disorder, current episode depressed, mild or moderate severity, unspecified: Secondary | ICD-10-CM

## 2021-04-14 NOTE — Progress Notes (Signed)
Virtual Visit via Video Note  I connected with Roger Duran on 04/14/21 at 3:14 PM EST  by a video enabled telemedicine application and verified that I am speaking with the correct person using two identifiers.  Location: Patient: Home Provider: [place] office    I discussed the limitations of evaluation and management by telemedicine and the availability of in person appointments. The patient expressed understanding and agreed to proceed.   I provided 44 minutes of non-face-to-face time during this encounter.   [name], LCSW   THERAPIST PROGRESS NOTE     Session Time: Wednesday 04/14/2021  3:14 PM - 3:59 PM   Participation Level: Active     Behavioral Response: Casual/ Alert/  Type of Therapy: Individual Therapy  Treatment Goals addressed: Patient wants to improve skills to cope with everyday stress/anxiety, learn how to say no to others and manage money/has ability to handle effectively the full variety of life's anxieties, develop ability to recognize, accept, and cope with feelings of depression  Interventions: CBT and Supportive  Summary: Roger Duran is a 50 y.o. male who  is referrred for services by psychiatrist Dr. Harrington Challenger for continuity of care. Patient has history of bipolar disorder and was seeing S. Schneidmiller for therapy. She now is out on medical leave. He reports one psychiatric hospitalization due to suicide attempt in 2009. He received outpatient treatment at [facility]. He eventually saw Ms. Schenidmiller and psychiatrist Dr. Harrington Challenger.  Patient reports he tends to have depressive and missed episodes.  He also reports constant anxiety along with paranoia and delusional thinking at times.  He reports being depressed most of the time and having to force himself out of bed.  Patient last was seen via virtual visit about 2 weeks ago.  Patient reports increased stress, frustration, and sadness since last session.  He expresses disappointment as  the setting adjustments to control his tremors are not lasting for very long.  Per patient's report, the setting adjustments are lasting for only about a week and then some of his symptoms recur.  He reports the tremors still are not as bad as they were before he had the surgery.  He is scheduled to go back for another set and adjustment on January 6.  He also reports stress and frustration related to his father who has alcohol abuse/dependence issues and makes negative demeaning statements when drinking.  He also expresses frustration as he states the situation is not fair to his mother as she does things that he does not do regarding household tasks/responsibilities due to his drinking.  Patient reports more tension in the relationship with his father and reports feeling the need to protect his mother who vents to him about frustrations with his father. Suicidal/Homicidal: No, without intent/plan   Therapist Response: reviewed symptoms, discussed stressors, facilitated expression of thoughts and feelings, validated feelings, assisted patient identify coping statements to manage distress associated with the recurring tremors and the process of setting adjustments, encourage patient to write down questions to prepare for his next doctor's visit, assisted patient identify realistic expectations regarding interaction with his father, also assisted patient identify realistic expectations of self  Plan: Return in 2 weeks  Diagnosis: Axis I: Bipolar, Depressed    Axis II: No diagnosis    [name], LCSW 04/14/2021

## 2021-04-28 ENCOUNTER — Other Ambulatory Visit: Payer: Self-pay

## 2021-04-28 ENCOUNTER — Ambulatory Visit (INDEPENDENT_AMBULATORY_CARE_PROVIDER_SITE_OTHER): Payer: Medicare Other | Admitting: Psychiatry

## 2021-04-28 DIAGNOSIS — F3130 Bipolar disorder, current episode depressed, mild or moderate severity, unspecified: Secondary | ICD-10-CM | POA: Diagnosis not present

## 2021-04-28 NOTE — Progress Notes (Signed)
Virtual Visit via Video Note  I connected with Roger Duran on 04/28/21 at 3:12 PM by a video enabled telemedicine application and verified that I am speaking with the correct person using two identifiers.  Location: Patient: Home Provider: [place] office    I discussed the limitations of evaluation and management by telemedicine and the availability of in person appointments. The patient expressed understanding and agreed to proceed.   I provided 24 minutes of non-face-to-face time during this encounter.   [name], LCSW   THERAPIST PROGRESS NOTE     Session Time: Wednesday 04/28/2021  3:14 PM -  3:38 PM   Participation Level: Active     Behavioral Response: Casual/ Alert/  Type of Therapy: Individual Therapy  Treatment Goals addressed: Patient wants to improve skills to cope with everyday stress/anxiety, learn how to say no to others and manage money/has ability to handle effectively the full variety of life's anxieties, develop ability to recognize, accept, and cope with feelings of depression  Interventions: CBT and Supportive  Summary: Roger Duran is a 50 y.o. male who  is referrred for services by psychiatrist Dr. Harrington Challenger for continuity of care. Patient has history of bipolar disorder and was seeing S. Schneidmiller for therapy. She now is out on medical leave. He reports one psychiatric hospitalization due to suicide attempt in 2009. He received outpatient treatment at [facility]. He eventually saw Ms. Schenidmiller and psychiatrist Dr. Harrington Challenger.  Patient reports he tends to have depressive and missed episodes.  He also reports constant anxiety along with paranoia and delusional thinking at times.  He reports being depressed most of the time and having to force himself out of bed.  Patient last was seen via virtual visit about 2-3 weeks ago.  Patient continues to report frustration regarding setting adjustments issues related to controlling his tremors  but expresses increased acceptance.  He is looking forward to seeing his doctor regarding this on January 6.  He reports enjoying celebrating Christmas with his family.  He reports missing his grandmother during the holidays but denies being overwhelmed by this.  He reports it is difficult coming home to an empty house but reports managing this fairly well.  He also reports being able to manage financial obligations with some assistance from mother.  He is pleased with his improved ability to manage his finances.  He  is looking forward to celebrating New Year's with family or friends.  He reports decreased stress regarding his father's behavior and reports more realistic expectations.  Patient reports he is not feeling well today due to sinus infection.  Patient and therapist agreed to end session early.  Suicidal/Homicidal: No, without intent/plan   Therapist Response: reviewed symptoms, discussed stressors, facilitated expression of thoughts and feelings, validated feelings, praised and reinforced patient's efforts to have more realistic expectations, discussed effects mood/thoughts/behavior Plan: Return in 2 weeks  Diagnosis: Axis I: Bipolar, Depressed    Axis II: No diagnosis    [name], LCSW 04/28/2021

## 2021-05-10 ENCOUNTER — Encounter (HOSPITAL_COMMUNITY): Payer: Self-pay | Admitting: Psychiatry

## 2021-05-10 ENCOUNTER — Other Ambulatory Visit: Payer: Self-pay

## 2021-05-10 ENCOUNTER — Telehealth (INDEPENDENT_AMBULATORY_CARE_PROVIDER_SITE_OTHER): Payer: Medicare Other | Admitting: Psychiatry

## 2021-05-10 DIAGNOSIS — F3130 Bipolar disorder, current episode depressed, mild or moderate severity, unspecified: Secondary | ICD-10-CM

## 2021-05-10 MED ORDER — ALPRAZOLAM 2 MG PO TABS: 2.0000 mg | ORAL_TABLET | Freq: Four times a day (QID) | ORAL | 2 refills | Status: DC

## 2021-05-10 MED ORDER — SERTRALINE HCL 50 MG PO TABS: 50.0000 mg | ORAL_TABLET | Freq: Every day | ORAL | 2 refills | Status: DC

## 2021-05-10 MED ORDER — LATUDA 120 MG PO TABS: 120.0000 mg | ORAL_TABLET | Freq: Every day | ORAL | 2 refills | Status: DC

## 2021-05-10 MED ORDER — TRAZODONE HCL 100 MG PO TABS: ORAL_TABLET | ORAL | 2 refills | Status: DC

## 2021-05-10 MED ORDER — LAMOTRIGINE 200 MG PO TABS: ORAL_TABLET | ORAL | 2 refills | Status: DC

## 2021-05-10 NOTE — Progress Notes (Signed)
Virtual Visit via Video Note  I connected with Roger Duran on 05/10/21 at  3:00 PM EST by a video enabled telemedicine application and verified that I am speaking with the correct person using two identifiers.  Location: Patient: home Provider: home office   I discussed the limitations of evaluation and management by telemedicine and the availability of in person appointments. The patient expressed understanding and agreed to proceed.     I discussed the assessment and treatment plan with the patient. The patient was provided an opportunity to ask questions and all were answered. The patient agreed with the plan and demonstrated an understanding of the instructions.   The patient was advised to call back or seek an in-person evaluation if the symptoms worsen or if the condition fails to improve as anticipated.  I provided 15 minutes of non-face-to-face time during this encounter.   Roger Spiller, MD  [facility] MD/PA/NP OP Progress Note  05/10/2021 3:29 PM Roger Duran  MRN:  [id number]  Chief Complaint:  Chief Complaint   Anxiety; Depression; Follow-up    HPI: This patient is a 51 year old single white male lives alone in [place].  He is on disability for bipolar disorder.  The patient returns for follow-up after about 6 weeks.  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  When I talked to him in November he was still having difficulties with tremors.  He has had adjustments made to the settings several times and he is finally feeling more comfortable.  The hand tremors are diminished considerably.  He denies any other side effects.  The patient states his mood has been stable.  He does miss his grandmother whom he cared for.  She died several months ago.  He is living in her old house.  He is just joined a gym and is trying to get more active.  He denies significant depression anxiety manic symptoms or suicidal ideation.  His sleep is  variable but is starting to improve. Visit Diagnosis:    ICD-10-CM   1. Bipolar I disorder, most recent episode depressed ([place])  F31.30       Past Psychiatric History: Hospitalization in 2009 after suicide attempt  Past Medical History:  Past Medical History:  Diagnosis Date   Anxiety    Bipolar 1 disorder ([place])    Depression    Mania ([place])    Psoriasis     Past Surgical History:  Procedure Laterality Date   APPENDECTOMY     back procedures     COLONOSCOPY N/A 11/14/2014   Procedure: COLONOSCOPY;  Surgeon: Rogene Houston, MD;  Location: [facility];  Service: Endoscopy;  Laterality: N/A;  11:10 - moved to 8:30 - Ann to notify   TONSILLECTOMY     WRIST SURGERY      Family Psychiatric History: see below  Family History:  Family History  Problem Relation Age of Onset   Hyperlipidemia Mother    Heart disease Father    Anxiety disorder Father    Bipolar disorder Maternal Grandfather     Social History:  Social History   Socioeconomic History   Marital status: Single    Spouse name: Not on file   Number of children: Not on file   Years of education: Not on file   Highest education level: Not on file  Occupational History   Not on file  Tobacco Use   Smoking status: Former    Types: Cigarettes, E-cigarettes  Quit date: 11/21/2019    Years since quitting: 1.4   Smokeless tobacco: Former  Scientific laboratory technician Use: Not on file  Substance and Sexual Activity   Alcohol use: No    Alcohol/week: 0.0 standard drinks    Comment: rarely   Drug use: No    Comment: 1995-2000 PER PT HE DID A LOT OF COCAINE AND PILLS AND DRANK A LOT   Sexual activity: Not Currently    Partners: Male    Birth control/protection: Condom  Other Topics Concern   Not on file  Social History Narrative   Not on file   Social Determinants of Health   Financial Resource Strain: Not on file  Food Insecurity: Not on file  Transportation Needs: Not on file  Physical Activity: Not on file   Stress: Not on file  Social Connections: Not on file    Allergies:  Allergies  Allergen Reactions   Apple Itching and Swelling    Throat and mouth itching and swelling Throat and mouth itching and swelling   Bee Venom Anaphylaxis and Other (See Comments)    Weak, shaky, dizzy. Weak, shaky, dizzy.   Morphine Palpitations   Aspirin Nausea And Vomiting   Sulfa Antibiotics Nausea And Vomiting   Dicyclomine Other (See Comments)    Aka Dicyclomine Aka Dicyclomine   Other Other (See Comments)    Aka Dicyclomine   Penicillins Itching and Rash    Metabolic Disorder Labs: No results found for: HGBA1C, MPG No results found for: PROLACTIN No results found for: CHOL, TRIG, HDL, CHOLHDL, VLDL, LDLCALC No results found for: TSH  Therapeutic Level Labs: Lab Results  Component Value Date   LITHIUM 1.10 10/15/2014   No results found for: VALPROATE No components found for:  CBMZ  Current Medications: Current Outpatient Medications  Medication Sig Dispense Refill   acetaminophen (TYLENOL) 500 MG tablet Take by mouth.     acyclovir (ZOVIRAX) 400 MG tablet TAKE (1) TABLET THREE TIMES DAILY AS NEEDED. 60 tablet 10   alfuzosin (UROXATRAL) 10 MG 24 hr tablet Take 10 mg by mouth daily.     alprazolam (XANAX) 2 MG tablet Take 1 tablet (2 mg total) by mouth in the morning, at noon, in the evening, and at bedtime. 120 tablet 2   baclofen (LIORESAL) 10 MG tablet Take 10 mg by mouth 2 (two) times daily as needed.     clobetasol (TEMOVATE) 0.05 % external solution Apply 1 application topically 2 (two) times daily. 50 mL 9   cycloSPORINE (RESTASIS) 0.05 % ophthalmic emulsion Place 1 drop into both eyes 2 (two) times daily.     EPINEPHrine 0.3 mg/0.3 mL IJ SOAJ injection Inject into the muscle.     gabapentin (NEURONTIN) 600 MG tablet Take 600 mg by mouth 4 (four) times daily.     HUMIRA PEN 40 MG/0.4ML PNKT INJECT 40MG  SUBCUTANEOUSLY EVERY 2 WEEKS AS DIRECTED. 2 each 10   lamoTRIgine (LAMICTAL)  200 MG tablet TAKE  (1)  TABLET TWICE A DAY. 60 tablet 2   loratadine (CLARITIN) 10 MG tablet Take 10 mg by mouth daily.     Lurasidone HCl (LATUDA) 120 MG TABS Take 1 tablet (120 mg total) by mouth daily. 90 tablet 2   Multiple Vitamins-Minerals (MENS 50+ MULTI VITAMIN/MIN) TABS Take 1 tablet by mouth every morning.     Omega-3 Fatty Acids (COROMEGA OMEGA 3 SQUEEZE) EMUL Take by mouth.     omeprazole (PRILOSEC) 20 MG capsule Take 20 mg by  mouth daily.     Potassium Gluconate 2.5 MEQ TABS Take by mouth.     sertraline (ZOLOFT) 50 MG tablet Take 1 tablet (50 mg total) by mouth daily. 30 tablet 2   traZODone (DESYREL) 100 MG tablet TAKE 1 OR 2 TABLETS AT BEDTIME 60 tablet 2   VEMLIDY 25 MG TABS Take 1 tablet by mouth daily.     No current facility-administered medications for this visit.     Musculoskeletal: Strength & Muscle Tone: within normal limits Gait & Station: normal Patient leans: N/A  Psychiatric Specialty Exam: Review of Systems  Neurological:  Positive for tremors.  All other systems reviewed and are negative.  There were no vitals taken for this visit.There is no height or weight on file to calculate BMI.  General Appearance: Casual and Fairly Groomed  Eye Contact:  Good  Speech:  Clear and Coherent  Volume:  Normal  Mood:  Euthymic  Affect:  Appropriate and Congruent  Thought Process:  Goal Directed  Orientation:  Full (Time, Place, and Person)  Thought Content: Rumination   Suicidal Thoughts:  No  Homicidal Thoughts:  No  Memory:  Immediate;   Good Recent;   Good Remote;   Fair  Judgement:  Good  Insight:  Good  Psychomotor Activity:  Tremor  Concentration:  Concentration: Good and Attention Span: Good  Recall:  Good  Fund of Knowledge: Good  Language: Good  Akathisia:  No  Handed:  Right  AIMS (if indicated): not done  Assets:  Communication Skills Desire for Improvement Resilience Social Support Talents/Skills  ADL's:  Intact  Cognition: WNL   Sleep:  Fair   Screenings: PHQ2-9    Flowsheet Row Video Visit from 05/10/2021 in [facility] Video Visit from 03/29/2021 in [facility] Video Visit from 01/27/2021 in [facility] from 12/23/2020 in [facility] Video Visit from 12/01/2020 in [facility]  PHQ-2 Total Score 0 1 0 3 1  PHQ-9 Total Score -- -- 2 12 --      Flowsheet Row Video Visit from 05/10/2021 in [facility] Video Visit from 01/27/2021 in [facility] from 12/23/2020 in [facility]  C-SSRS RISK CATEGORY No Risk Error: Q7 should not be populated when Q6 is No Low Risk        Assessment and Plan: Patient is a 51 year old male with a history of bipolar disorder and anxiety.  He continues to do well in terms of mood stabilization.  He will continue Zoloft 50 mg for depression, Lamictal 200 mg twice daily for mood stabilization, lurasidone 120 mg daily for mood stabilization, Xanax 2 mg 4 times daily for anxiety and trazodone 100 to 200 mg at bedtime for sleep.  He will return to see me in 2 months   Roger Spiller, MD 05/10/2021, 3:29 PM

## 2021-05-13 ENCOUNTER — Other Ambulatory Visit: Payer: Self-pay

## 2021-05-13 ENCOUNTER — Ambulatory Visit (INDEPENDENT_AMBULATORY_CARE_PROVIDER_SITE_OTHER): Payer: Medicare Other | Admitting: Psychiatry

## 2021-05-13 ENCOUNTER — Encounter (HOSPITAL_COMMUNITY): Payer: Self-pay

## 2021-05-13 DIAGNOSIS — F3130 Bipolar disorder, current episode depressed, mild or moderate severity, unspecified: Secondary | ICD-10-CM

## 2021-05-13 NOTE — Plan of Care (Signed)
Pt participated in development of plan.

## 2021-05-13 NOTE — Progress Notes (Signed)
Virtual Visit via Video Note  I connected with Roger Duran on 05/13/21 at 1:11 PM EST  by a video enabled telemedicine application and verified that I am speaking with the correct person using two identifiers.  Location: Patient: Home Provider: [place] office    I discussed the limitations of evaluation and management by telemedicine and the availability of in person appointments. The patient expressed understanding and agreed to proceed.  The patient was advised to call back or seek an in-person evaluation if the symptoms worsen or if the condition fails to improve as anticipated.  I provided 43 minutes of non-face-to-face time during this encounter.   [name], LCSW    THERAPIST PROGRESS NOTE     Session Time: Thursday 05/13/2021 1:11 PM -1:54 PM    Participation Level: Active     Behavioral Response: Casual/ Alert/  Type of Therapy: Individual Therapy  Treatment Goals addressed: Patient wants to improve skills to cope with everyday stress/anxiety, learn how to say no to others and manage money/has ability to handle effectively the full variety of life's anxieties, develop ability to recognize, accept, and cope with feelings of depression  Interventions: CBT and Supportive  Summary: Roger Duran is a 51 y.o. male who  is referrred for services by psychiatrist Dr. Harrington Challenger for continuity of care. Patient has history of bipolar disorder and was seeing S. Schneidmiller for therapy. She now is out on medical leave. He reports one psychiatric hospitalization due to suicide attempt in 2009. He received outpatient treatment at [facility]. He eventually saw Ms. Schenidmiller and psychiatrist Dr. Harrington Challenger.  Patient reports he tends to have depressive and missed episodes.  He also reports constant anxiety along with paranoia and delusional thinking at times.  He reports being depressed most of the time and having to force himself out of bed.  Patient last was seen via  virtual visit about 2-3 weeks ago.  Patient continues to report frustration regarding still experiencing tremors despite setting adjustments completed and his doctors visit last week.  He is pleased he prepared questions for the visit and received helpful and supportive responses from his doctor.  He reports enjoying celebrating [name] eve with his family and friends.  He continues to miss his grandmother but states coping with grief and loss well.  He reports having adjustment issues to living alone and experiencing loneliness.  This triggers sadness and depression.  He reports some difficulty trying to figure out how to engage in life.  He states continued pattern of putting self down and experiencing frequent worry thoughts of what if.   Suicidal/Homicidal: No, without intent/plan   Therapist Response: reviewed symptoms, discussed stressors, facilitated expression of thoughts and feelings, validated feelings, developed treatment plan, obtained patient's permission to electronically sign plan for patient as this was a virtual visit, began to orient patient to CBT, assisted patient began to identify the connection between his thoughts/mood/behavior, praised and reinforced patient's efforts to join gym, encourage patient to follow through with plan to attend a class at the gym once he recovers from bronchitis   Plan: Return in 2 weeks  Diagnosis: Axis I: Bipolar, Depressed    Axis II: No diagnosis    [name], LCSW 05/13/2021

## 2021-05-27 ENCOUNTER — Ambulatory Visit (INDEPENDENT_AMBULATORY_CARE_PROVIDER_SITE_OTHER): Payer: Medicare Other | Admitting: Psychiatry

## 2021-05-27 ENCOUNTER — Other Ambulatory Visit: Payer: Self-pay

## 2021-05-27 DIAGNOSIS — F3130 Bipolar disorder, current episode depressed, mild or moderate severity, unspecified: Secondary | ICD-10-CM

## 2021-05-27 NOTE — Progress Notes (Signed)
Virtual Visit via Video Note  I connected with Roger Duran on 05/27/21 at 1:15 PM EST  by a video enabled telemedicine application and verified that I am speaking with the correct person using two identifiers.  Location: Patient: Home Provider: [place] office   I discussed the limitations of evaluation and management by telemedicine and the availability of in person appointments. The patient expressed understanding and agreed to proceed.   I provided 43 minutes of non-face-to-face time during this encounter.   [name], LCSW  THERAPIST PROGRESS NOTE     Session Time: Thursday 05/27/2021 1:15 PM - 1:58 PM   Participation Level: Active     Behavioral Response: Casual/ Alert/  Type of Therapy: Individual Therapy  Treatment Goals addressed: Patient wants to improve skills to cope with everyday stress/anxiety, learn how to say no to others and manage money/has ability to handle effectively the full variety of life's anxieties, develop ability to recognize, accept, and cope with feelings of depression  Interventions: CBT and Supportive  Summary: Roger Duran is a 51 y.o. male who  is referrred for services by psychiatrist Dr. Harrington Challenger for continuity of care. Patient has history of bipolar disorder and was seeing S. Schneidmiller for therapy. She now is out on medical leave. He reports one psychiatric hospitalization due to suicide attempt in 2009. He received outpatient treatment at [facility]. He eventually saw Ms. Schenidmiller and psychiatrist Dr. Harrington Challenger.  Patient reports he tends to have depressive and missed episodes.  He also reports constant anxiety along with paranoia and delusional thinking at times.  He reports being depressed most of the time and having to force himself out of bed.  Patient last was seen via virtual visit about 2-3 weeks ago.  He reports increased depressed mood, decreased interest and involvement in activities, negative thoughts about  self and his appearance, sleep difficulty, poor sleep hygiene, and anxiety.  He reports stress regarding financial obligations.  He also expresses frustration as he wants to change things around in his home to make it more his but does not have the money to do so.  He also reports being very lonely and his grandmother's house.  He expresses frustration with self regarding where he is in life particularly related to finances.  Patient reports wanting to be involved with someone but feels as though he has nothing to offer.  He reports little involvement in activity rather than watching TV, feeding the dog, and hanging out with his parents at night.  He reports he has not started attending the gym.  He expresses some anxiety about social interaction.  Suicidal/Homicidal: No, without intent/plan   Therapist Response: reviewed symptoms, discussed stressors, facilitated expression of thoughts and feelings, validated feelings, normalized feelings related to grief and loss issues, discussed adjustment as part of the task of mourning, began to discuss internal and external adjustments assisted patient identify ways to increase behavioral activation using value congruent activity, developed plan with patient to go to the gym for 15 to 30 minutes/day 3 days a week and walk on the treadmill, assisted patient identify and address thoughts and processes that may inhibit implementation of plan,   Plan: Return in 2 weeks  Diagnosis: Axis I: Bipolar, Depressed    Axis II: No diagnosis    [name], LCSW 05/27/2021

## 2021-05-28 ENCOUNTER — Encounter (HOSPITAL_COMMUNITY): Payer: Self-pay

## 2021-06-10 ENCOUNTER — Ambulatory Visit (INDEPENDENT_AMBULATORY_CARE_PROVIDER_SITE_OTHER): Payer: Medicare Other | Admitting: Psychiatry

## 2021-06-10 ENCOUNTER — Other Ambulatory Visit: Payer: Self-pay

## 2021-06-10 DIAGNOSIS — F3130 Bipolar disorder, current episode depressed, mild or moderate severity, unspecified: Secondary | ICD-10-CM | POA: Diagnosis not present
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
## 2021-06-24 ENCOUNTER — Other Ambulatory Visit: Payer: Self-pay

## 2021-06-24 ENCOUNTER — Ambulatory Visit (INDEPENDENT_AMBULATORY_CARE_PROVIDER_SITE_OTHER): Payer: Medicare Other | Admitting: Psychiatry

## 2021-06-24 DIAGNOSIS — F3130 Bipolar disorder, current episode depressed, mild or moderate severity, unspecified: Secondary | ICD-10-CM | POA: Diagnosis not present
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
## 2021-07-08 ENCOUNTER — Telehealth (HOSPITAL_COMMUNITY): Payer: Medicare Other | Admitting: Psychiatry

## 2021-07-08 ENCOUNTER — Encounter: Payer: Self-pay | Admitting: Physician Assistant

## 2021-07-08 ENCOUNTER — Other Ambulatory Visit: Payer: Self-pay

## 2021-07-08 ENCOUNTER — Ambulatory Visit: Payer: Medicare Other | Admitting: Physician Assistant

## 2021-07-08 DIAGNOSIS — R5383 Other fatigue: Secondary | ICD-10-CM | POA: Diagnosis not present

## 2021-07-08 DIAGNOSIS — L720 Epidermal cyst: Secondary | ICD-10-CM

## 2021-07-08 DIAGNOSIS — L400 Psoriasis vulgaris: Secondary | ICD-10-CM

## 2021-07-08 DIAGNOSIS — B351 Tinea unguium: Secondary | ICD-10-CM

## 2021-07-08 DIAGNOSIS — Z79899 Other long term (current) drug therapy: Secondary | ICD-10-CM | POA: Diagnosis not present

## 2021-07-08 DIAGNOSIS — D489 Neoplasm of uncertain behavior, unspecified: Secondary | ICD-10-CM

## 2021-07-08 NOTE — Patient Instructions (Signed)

## 2021-07-09 ENCOUNTER — Encounter: Payer: Self-pay | Admitting: Physician Assistant

## 2021-07-09 NOTE — Progress Notes (Signed)
? ?  Follow-Up Visit ?  ?Subjective  ?Roger Duran is a 51 y.o. male who presents for the following: Psoriasis (Patient needs tb test and no flare on humira ). He is tolerating it well without any adverse side effects. He has a persistent pimple on the end of his nose that bleeds and is persistent.  ? ? ?The following portions of the chart were reviewed this encounter and updated as appropriate:  Tobacco  Allergies  Meds  Problems  Med Hx  Surg Hx  Fam Hx   ?  ? ?Objective  ?Well appearing patient in no apparent distress; mood and affect are within normal limits. ? ?A full examination was performed including scalp, head, eyes, ears, nose, lips, neck, chest, axillae, abdomen, back, buttocks, bilateral upper extremities, bilateral lower extremities, hands, feet, fingers, toes, fingernails, and toenails. All findings within normal limits unless otherwise noted below. ? ?total body. ?Clear today.  ? ?Mid Tip of Nose ?Pearly papule with telangectasia.  ? ? ? ? ? ? ?All toenails and left hand and fingernails. ?Thick yellow nails that are lifting from the base. Described as one hand, two foot fungus. ? ? ?Assessment & Plan  ?Plaque psoriasis ?total body. ? ?Continue Humira ? ?QuantiFERON-TB Gold Plus - total body. ? ?Other fatigue ? ?Related Procedures ?QuantiFERON-TB Gold Plus ? ?Medication management ? ?Related Procedures ?QuantiFERON-TB Gold Plus ? ?Neoplasm of uncertain behavior ?Mid Tip of Nose ? ?Skin / nail biopsy ?Type of biopsy: tangential   ?Informed consent: discussed and consent obtained   ?Timeout: patient name, date of birth, surgical site, and procedure verified   ?Anesthesia: the lesion was anesthetized in a standard fashion   ?Anesthetic:  1% lidocaine w/ epinephrine 1-100,000 local infiltration ?Instrument used: flexible razor blade   ?Hemostasis achieved with: aluminum chloride and electrodesiccation   ?Outcome: patient tolerated procedure well   ?Post-procedure details: wound care instructions  given   ? ?Specimen 1 - Surgical pathology ?Differential Diagnosis: bcc vs scc -cautery after biospy ? ?Check Margins: yes ? ?Onychomycosis ?All toenails and left hand and fingernails. ? ?Over the counter Lamisil or lotrimin ultra to skin and nails nightly. Likely no cure for this but the antifungal will change his skin presentation and decrease fungal growth. ? ? ? ?I, Athanasius Kesling, PA-C, have reviewed all documentation's for this visit.  The documentation on 07/09/21 for the exam, diagnosis, procedures and orders are all accurate and complete. ?

## 2021-07-13 ENCOUNTER — Ambulatory Visit (INDEPENDENT_AMBULATORY_CARE_PROVIDER_SITE_OTHER): Payer: Medicare Other | Admitting: Psychiatry

## 2021-07-13 ENCOUNTER — Other Ambulatory Visit: Payer: Self-pay

## 2021-07-13 DIAGNOSIS — F3130 Bipolar disorder, current episode depressed, mild or moderate severity, unspecified: Secondary | ICD-10-CM | POA: Diagnosis not present

## 2021-07-13 LAB — QUANTIFERON-TB GOLD PLUS
Mitogen-NIL: >10 IU/mL
NIL: 0.02 IU/mL
QuantiFERON-TB Gold Plus: NEGATIVE
TB1-NIL: 0.01 IU/mL
TB2-NIL: 0.02 IU/mL

## 2021-07-13 NOTE — Progress Notes (Signed)
Virtual Visit via Video Note ? ?I connected with Roger Duran on 07/13/21 at 4:08 PM  by a video enabled telemedicine application and verified that I am speaking with the correct person using two identifiers. ? ?Location: ?Patient: Home ?Provider: [facility] [place] office  ?  ?I discussed the limitations of evaluation and management by telemedicine and the availability of in person appointments. The patient expressed understanding and agreed to proceed. ? ? ?I provided 47 minutes of non-face-to-face time during this encounter. ? ? ?Roger Zeck E Rafael Salway, LCSW ? ? ?THERAPIST PROGRESS NOTE ? ? ? ? ?Session Time: Tuesday  07/13/2021 4:08 PM - 4:55 PM  ? ? ?Participation Level: Active    ? ?Behavioral Response: Casual/ Alert/ ? ?Type of Therapy: Individual Therapy ? ?Treatment Goals addressed: Patient will practice behavioral activation skills 2-3 times per week for the next 12 weeks ? ?Progress on goals: Progressing ? ?Interventions: CBT and Supportive ? ?Summary: Roger Duran is a 51 y.o. male who  is referrred for services by psychiatrist Dr. Harrington Challenger for continuity of care. Patient has history of bipolar disorder and was seeing S. Schneidmiller for therapy. She now is out on medical leave. He reports one psychiatric hospitalization due to suicide attempt in 2009. He received outpatient treatment at [facility]. He eventually saw Ms. Schenidmiller and psychiatrist Dr. Harrington Challenger.  Patient reports he tends to have depressive and missed episodes.  He also reports constant anxiety along with paranoia and delusional thinking at times.  He reports being depressed most of the time and having to force himself out of bed. ? ?Patient last was seen via virtual visit about 2-3 weeks ago.  He continues to experience symptoms of depression and anxiety.  However, he reports some days of having a neutral mood.  He has used bipolar mood log and has noticed the effects of the lack of sleep on his mood.  He reports increased stress regarding  concerns about his brother who has substance abuse issues that began when he became addicted to pain pills per patient's report.  Patient worries about brother and the effects on the family.  Patient reports continued daily contact with his parents and doing household tasks.however he reports a little involvement in other activity.  He expresses frustration with self as he has gained a significant amount of weight.  He reports continued poor sleep pattern reports difficulty get involved in activities due to this.  He still expresses desire to exercise and improve weight management but has difficulty getting started.   ? ?Suicidal/Homicidal: No, without intent/plan ?  ?Therapist Response: reviewed symptoms, praised and reinforced patient's use of bipolar mood log, assisted patient examine mood log and identify patterns as well as ways to intervene in unhealthy patterns, discussed thoughts and processes that affect sleep pattern, developed plan with patient to try to adhere to consistent going to bedtime/use of sleep medication as prescribed by psychiatrist/avoid napping during the day, also developed plan for patient to continue using bipolar mood log, discussed stressors, facilitated expression of thoughts and feelings, validated feelings  ? ?Plan: Return in 2 weeks ? ?Diagnosis: Axis I: Bipolar, Depressed ? ?  Axis II: No diagnosis ? ? ? ?Adamarys Shall E Tyia Binford, LCSW ?07/13/2021 ? ? ? ? ? ? ? ? ? ? ?

## 2021-07-15 ENCOUNTER — Encounter (HOSPITAL_COMMUNITY): Payer: Self-pay | Admitting: Psychiatry

## 2021-07-15 ENCOUNTER — Telehealth (INDEPENDENT_AMBULATORY_CARE_PROVIDER_SITE_OTHER): Payer: Medicare Other | Admitting: Psychiatry

## 2021-07-15 ENCOUNTER — Other Ambulatory Visit: Payer: Self-pay

## 2021-07-15 DIAGNOSIS — F3130 Bipolar disorder, current episode depressed, mild or moderate severity, unspecified: Secondary | ICD-10-CM

## 2021-07-15 MED ORDER — ALPRAZOLAM 2 MG PO TABS: 2.0000 mg | ORAL_TABLET | Freq: Four times a day (QID) | ORAL | 2 refills | Status: DC

## 2021-07-15 MED ORDER — TRAZODONE HCL 100 MG PO TABS: ORAL_TABLET | ORAL | 2 refills | Status: DC

## 2021-07-15 MED ORDER — SERTRALINE HCL 100 MG PO TABS: 100.0000 mg | ORAL_TABLET | Freq: Every day | ORAL | 2 refills | Status: DC

## 2021-07-15 MED ORDER — LAMOTRIGINE 200 MG PO TABS: ORAL_TABLET | ORAL | 2 refills | Status: DC

## 2021-07-15 MED ORDER — LURASIDONE HCL 120 MG PO TABS: 120.0000 mg | ORAL_TABLET | Freq: Every day | ORAL | 2 refills | Status: DC

## 2021-07-15 NOTE — Progress Notes (Signed)
Virtual Visit via Video Note ? ?I connected with Roger Duran on 07/15/21 at 11:20 AM EDT by a video enabled telemedicine application and verified that I am speaking with the correct person using two identifiers. ? ?Location: ?Patient: home ?Provider: office ?  ?I discussed the limitations of evaluation and management by telemedicine and the availability of in person appointments. The patient expressed understanding and agreed to proceed. ? ? ? ?  ?I discussed the assessment and treatment plan with the patient. The patient was provided an opportunity to ask questions and all were answered. The patient agreed with the plan and demonstrated an understanding of the instructions. ?  ?The patient was advised to call back or seek an in-person evaluation if the symptoms worsen or if the condition fails to improve as anticipated. ? ?I provided 15 minutes of non-face-to-face time during this encounter. ? ? ?Roger Spiller, MD ? ?BH MD/PA/NP OP Progress Note ? ?07/15/2021 11:43 AM ?Roger Duran  ?MRN:  [id number] ? ?Chief Complaint:  ?Chief Complaint  ?Patient presents with  ? Depression  ? Anxiety  ? Manic Behavior  ? Follow-up  ? ?HPI: This patient is a 51 year old single white male lives alone in [place].  He is on disability for bipolar disorder. ? ?The patient returns for follow-up after 2 months regarding his bipolar disorder.  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  There have been adjustments made several times and his tremor is a lot better.  It is causing a bit of slurred speech but other than that he is doing okay.  At times however he gets unsteady and has had a few falls.  He is going back to the neurology specialist later this month. ? ?His main complaint today is that he lacks motivation and energy.  He has been sick with bronchitis several times this winter.  He does not have any motivation to do much of anything.  He feels somewhat more  depressed.  He joined a gym a couple of months ago but still has not started going.  He is still grieving his grandmother who he lost several months ago.  He is sleeping okay.  His energy is quite low.  He denies any thoughts of self-harm or suicidal ideation.  He has not had any lab work recently and I urged him to get back in with primary care but in the interim we can try increasing the Zoloft since he is seeming to be more depressed ?Visit Diagnosis:  ?  ICD-10-CM   ?1. Bipolar I disorder, most recent episode depressed ([name])  F31.30   ?  ? ? ?Past Psychiatric History: Hospitalization in 2009 after suicide attempt ? ?Past Medical History:  ?Past Medical History:  ?Diagnosis Date  ? Anxiety   ? Bipolar 1 disorder ([name])   ? Depression   ? Mania ([name])   ? Psoriasis   ?  ?Past Surgical History:  ?Procedure Laterality Date  ? APPENDECTOMY    ? back procedures    ? COLONOSCOPY N/A 11/14/2014  ? Procedure: COLONOSCOPY;  Surgeon: Rogene Houston, MD;  [facility];  Service: Endoscopy;  Laterality: N/A;  11:10 - moved to 8:30 Lelon Frohlich to notify  ? TONSILLECTOMY    ? WRIST SURGERY    ? ? ?Family Psychiatric History: See below ? ?Family History:  ?Family History  ?Problem Relation Age of Onset  ? Hyperlipidemia Mother   ? Heart  disease Father   ? Anxiety disorder Father   ? Bipolar disorder Maternal Grandfather   ? ? ?Social History:  ?Social History  ? ?Socioeconomic History  ? Marital status: Single  ?  Spouse name: Not on file  ? Number of children: Not on file  ? Years of education: Not on file  ? Highest education level: Not on file  ?Occupational History  ? Not on file  ?Tobacco Use  ? Smoking status: Former  ?  Types: Cigarettes, E-cigarettes  ?  Quit date: 11/21/2019  ?  Years since quitting: 1.6  ? Smokeless tobacco: Former  ?Vaping Use  ? Vaping Use: Never used  ?Substance and Sexual Activity  ? Alcohol use: No  ?  Alcohol/week: 0.0 standard drinks  ?  Comment: rarely  ? Drug use: No  ?  Comment: 1995-2000  PER PT HE DID A LOT OF COCAINE AND PILLS AND DRANK A LOT  ? Sexual activity: Not Currently  ?  Partners: Male  ?  Birth control/protection: Condom  ?Other Topics Concern  ? Not on file  ?Social History Narrative  ? Not on file  ? ?Social Determinants of Health  ? ?Financial Resource Strain: Not on file  ?Food Insecurity: Not on file  ?Transportation Needs: Not on file  ?Physical Activity: Not on file  ?Stress: Not on file  ?Social Connections: Not on file  ? ? ?Allergies:  ?Allergies  ?Allergen Reactions  ? Apple Juice Itching and Swelling  ?  Throat and mouth itching and swelling ?Throat and mouth itching and swelling  ? Bee Venom Anaphylaxis and Other (See Comments)  ?  Weak, shaky, dizzy. ?Weak, shaky, dizzy.  ? Morphine Palpitations  ? Aspirin Nausea And Vomiting  ? Sulfa Antibiotics Nausea And Vomiting  ? Dicyclomine Other (See Comments)  ?  Aka Dicyclomine ?Aka Dicyclomine  ? Other Other (See Comments)  ?  Aka Dicyclomine  ? Penicillins Itching and Rash  ? ? ?Metabolic Disorder Labs: ?No results found for: HGBA1C, MPG ?No results found for: PROLACTIN ?No results found for: CHOL, TRIG, HDL, CHOLHDL, VLDL, LDLCALC ?No results found for: TSH ? ?Therapeutic Level Labs: ?Lab Results  ?Component Value Date  ? LITHIUM 1.10 10/15/2014  ? ?No results found for: VALPROATE ?No components found for:  CBMZ ? ?Current Medications: ?Current Outpatient Medications  ?Medication Sig Dispense Refill  ? sertraline (ZOLOFT) 100 MG tablet Take 1 tablet (100 mg total) by mouth daily. 30 tablet 2  ? acetaminophen (TYLENOL) 500 MG tablet Take by mouth.    ? acyclovir (ZOVIRAX) 400 MG tablet TAKE (1) TABLET THREE TIMES DAILY AS NEEDED. 60 tablet 10  ? alfuzosin (UROXATRAL) 10 MG 24 hr tablet Take 10 mg by mouth daily.    ? alprazolam (XANAX) 2 MG tablet Take 1 tablet (2 mg total) by mouth in the morning, at noon, in the evening, and at bedtime. 120 tablet 2  ? baclofen (LIORESAL) 10 MG tablet Take 10 mg by mouth 2 (two) times daily as  needed.    ? clobetasol (TEMOVATE) 0.05 % external solution Apply 1 application topically 2 (two) times daily. 50 mL 9  ? cycloSPORINE (RESTASIS) 0.05 % ophthalmic emulsion Place 1 drop into both eyes 2 (two) times daily.    ? EPINEPHrine 0.3 mg/0.3 mL IJ SOAJ injection Inject into the muscle.    ? gabapentin (NEURONTIN) 600 MG tablet Take 600 mg by mouth 4 (four) times daily.    ? HUMIRA PEN 40 MG/0.4ML  PNKT INJECT '40MG'$  SUBCUTANEOUSLY EVERY 2 WEEKS AS DIRECTED. 2 each 10  ? lamoTRIgine (LAMICTAL) 200 MG tablet TAKE  (1)  TABLET TWICE A DAY. 60 tablet 2  ? Lurasidone HCl (LATUDA) 120 MG TABS Take 1 tablet (120 mg total) by mouth daily. 90 tablet 2  ? Multiple Vitamins-Minerals (MENS 50+ MULTI VITAMIN/MIN) TABS Take 1 tablet by mouth every morning.    ? Omega-3 Fatty Acids (COROMEGA OMEGA 3 SQUEEZE) EMUL Take by mouth.    ? omeprazole (PRILOSEC) 20 MG capsule Take 20 mg by mouth daily.    ? Potassium Gluconate 2.5 MEQ TABS Take by mouth.    ? traZODone (DESYREL) 100 MG tablet TAKE 1 OR 2 TABLETS AT BEDTIME 60 tablet 2  ? VEMLIDY 25 MG TABS Take 1 tablet by mouth daily.    ? ?No current facility-administered medications for this visit.  ? ? ? ?Musculoskeletal: ?Strength & Muscle Tone: within normal limits ?Gait & Station: normal ?Patient leans: N/A ? ?Psychiatric Specialty Exam: ?Review of Systems  ?Constitutional:  Positive for fatigue.  ?Musculoskeletal:  Positive for back pain.  ?Psychiatric/Behavioral:  Positive for dysphoric mood.   ?All other systems reviewed and are negative.  ?There were no vitals taken for this visit.There is no height or weight on file to calculate BMI.  ?General Appearance: Casual and Fairly Groomed  ?Eye Contact:  Good  ?Speech:  Clear and Coherent  ?Volume:  Normal  ?Mood:  Dysphoric  ?Affect:  Depressed  ?Thought Process:  Goal Directed  ?Orientation:  Full (Time, Place, and Person)  ?Thought Content: Rumination   ?Suicidal Thoughts:  No  ?Homicidal Thoughts:  No  ?Memory:  Immediate;    Good ?Recent;   Good ?Remote;   Good  ?Judgement:  Good  ?Insight:  Good  ?Psychomotor Activity:  Decreased  ?Concentration:  Concentration: Fair and Attention Span: Fair  ?Recall:  Good  ?Fund of Know

## 2021-07-27 ENCOUNTER — Other Ambulatory Visit: Payer: Self-pay

## 2021-07-27 ENCOUNTER — Ambulatory Visit (HOSPITAL_COMMUNITY): Payer: Medicare Other | Admitting: Psychiatry

## 2021-08-10 ENCOUNTER — Ambulatory Visit (HOSPITAL_COMMUNITY): Payer: Medicare Other | Admitting: Psychiatry

## 2021-08-12 ENCOUNTER — Telehealth (INDEPENDENT_AMBULATORY_CARE_PROVIDER_SITE_OTHER): Payer: Medicare Other | Admitting: Psychiatry

## 2021-08-12 ENCOUNTER — Encounter (HOSPITAL_COMMUNITY): Payer: Self-pay | Admitting: Psychiatry

## 2021-08-12 DIAGNOSIS — F411 Generalized anxiety disorder: Secondary | ICD-10-CM | POA: Diagnosis not present

## 2021-08-12 DIAGNOSIS — F332 Major depressive disorder, recurrent severe without psychotic features: Secondary | ICD-10-CM

## 2021-08-12 DIAGNOSIS — F3130 Bipolar disorder, current episode depressed, mild or moderate severity, unspecified: Secondary | ICD-10-CM

## 2021-08-12 MED ORDER — TRAZODONE HCL 50 MG PO TABS: 50.0000 mg | ORAL_TABLET | Freq: Every day | ORAL | 2 refills | Status: DC

## 2021-08-12 MED ORDER — LAMOTRIGINE 200 MG PO TABS: ORAL_TABLET | ORAL | 2 refills | Status: DC

## 2021-08-12 MED ORDER — ALPRAZOLAM 2 MG PO TABS: 2.0000 mg | ORAL_TABLET | Freq: Four times a day (QID) | ORAL | 2 refills | Status: DC

## 2021-08-12 MED ORDER — FLUOXETINE HCL 20 MG PO CAPS: 20.0000 mg | ORAL_CAPSULE | Freq: Every day | ORAL | 2 refills | Status: DC

## 2021-08-12 MED ORDER — LURASIDONE HCL 120 MG PO TABS: 120.0000 mg | ORAL_TABLET | Freq: Every day | ORAL | 2 refills | Status: DC

## 2021-08-12 NOTE — Progress Notes (Signed)
Virtual Visit via Video Note ? ?I connected with Roger Duran on 08/12/21 at  2:00 PM EDT by a video enabled telemedicine application and verified that I am speaking with the correct person using two identifiers. ? ?Location: ?Patient: home ?Provider: office ?  ?I discussed the limitations of evaluation and management by telemedicine and the availability of in person appointments. The patient expressed understanding and agreed to proceed. ? ? ?  ?I discussed the assessment and treatment plan with the patient. The patient was provided an opportunity to ask questions and all were answered. The patient agreed with the plan and demonstrated an understanding of the instructions. ?  ?The patient was advised to call back or seek an in-person evaluation if the symptoms worsen or if the condition fails to improve as anticipated. ? ?I provided 15 minutes of non-face-to-face time during this encounter. ? ? ?[name], MD ? ?BH MD/PA/NP OP Progress Note ? ?08/12/2021 2:19 PM ?Roger Duran  ?MRN:  [id number] ? ?Chief Complaint:  ?Chief Complaint  ?Patient presents with  ? Depression  ? Anxiety  ? Manic Behavior  ? Follow-up  ? ?HPI: This patient is a 51 year old single white male who lives alone in [place].  He is on disability for bipolar disorder. ? ?The patient returns for follow-up after 4 weeks regarding his bipolar disorder and depression.  Last time he seemed to be lacking in motivation and energy like he was dragging through life all the time.  We did increase his Zoloft but has not seen much improvement.  He cannot really attribute it to any other medical things although he has had bronchitis several times this winter.  He has been on numerous antidepressants including Lexapro Zoloft Paxil Trintellix and Wellbutrin.  He has never been on Prozac and I think this is worth a try as long as he does not get too activated.  This point he is sleeping well but he still does not have much energy.  He thinks the  trazodone is a little bit too strong and makes him too drowsy so we will cut it back to 50 mg.  He denies any thoughts of self-harm or suicide ?Visit Diagnosis:  ?  ICD-10-CM   ?1. Bipolar I disorder, most recent episode depressed ([place])  F31.30   ?  ?2. 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  ?3. GAD (generalized anxiety disorder)  F41.1   ?  ? ? ?Past Psychiatric History: Hospitalization in 2009 after suicide attempt ? ?Past Medical History:  ?Past Medical History:  ?Diagnosis Date  ? Anxiety   ? Bipolar 1 disorder ([place])   ? Depression   ? Mania ([place])   ? Psoriasis   ?  ?Past Surgical History:  ?Procedure Laterality Date  ? APPENDECTOMY    ? back procedures    ? COLONOSCOPY N/A 11/14/2014  ? Procedure: COLONOSCOPY;  Surgeon: Rogene Houston, MD;  [facility];  Service: Endoscopy;  Laterality: N/A;  11:10 - moved to 8:30 Lelon Frohlich to notify  ? TONSILLECTOMY    ? WRIST SURGERY    ? ? ?Family Psychiatric History: see below ? ?Family History:  ?Family History  ?Problem Relation Age of Onset  ? Hyperlipidemia Mother   ? Heart disease Father   ? Anxiety disorder Father   ? Bipolar disorder Maternal Grandfather   ? ? ?Social History:  ?Social History  ? ?Socioeconomic History  ? Marital status: Single  ?  Spouse name: Not on file  ? Number of children: Not on file  ? Years of education: Not on file  ? Highest education level: Not on file  ?Occupational History  ? Not on file  ?Tobacco Use  ? Smoking status: Former  ?  Types: Cigarettes, E-cigarettes  ?  Quit date: 11/21/2019  ?  Years since quitting: 1.7  ? Smokeless tobacco: Former  ?Vaping Use  ? Vaping Use: Never used  ?Substance and Sexual Activity  ? Alcohol use: No  ?  Alcohol/week: 0.0 standard drinks  ?  Comment: rarely  ? Drug use: No  ?  Comment: 1995-2000 PER PT HE DID A LOT OF COCAINE AND PILLS AND DRANK A LOT  ? Sexual activity: Not Currently  ?  Partners: Male  ?  Birth control/protection: Condom  ?Other  Topics Concern  ? Not on file  ?Social History Narrative  ? Not on file  ? ?Social Determinants of Health  ? ?Financial Resource Strain: Not on file  ?Food Insecurity: Not on file  ?Transportation Needs: Not on file  ?Physical Activity: Not on file  ?Stress: Not on file  ?Social Connections: Not on file  ? ? ?Allergies:  ?Allergies  ?Allergen Reactions  ? Apple Juice Itching and Swelling  ?  Throat and mouth itching and swelling ?Throat and mouth itching and swelling  ? Bee Venom Anaphylaxis and Other (See Comments)  ?  Weak, shaky, dizzy. ?Weak, shaky, dizzy.  ? Morphine Palpitations  ? Aspirin Nausea And Vomiting  ? Sulfa Antibiotics Nausea And Vomiting  ? Dicyclomine Other (See Comments)  ?  Aka Dicyclomine ?Aka Dicyclomine  ? Other Other (See Comments)  ?  Aka Dicyclomine  ? Penicillins Itching and Rash  ? ? ?Metabolic Disorder Labs: ?No results found for: HGBA1C, MPG ?No results found for: PROLACTIN ?No results found for: CHOL, TRIG, HDL, CHOLHDL, VLDL, LDLCALC ?No results found for: TSH ? ?Therapeutic Level Labs: ?Lab Results  ?Component Value Date  ? LITHIUM 1.10 10/15/2014  ? ?No results found for: VALPROATE ?No components found for:  CBMZ ? ?Current Medications: ?Current Outpatient Medications  ?Medication Sig Dispense Refill  ? FLUoxetine (PROZAC) 20 MG capsule Take 1 capsule (20 mg total) by mouth daily. 30 capsule 2  ? traZODone (DESYREL) 50 MG tablet Take 1 tablet (50 mg total) by mouth at bedtime. 30 tablet 2  ? acetaminophen (TYLENOL) 500 MG tablet Take by mouth.    ? acyclovir (ZOVIRAX) 400 MG tablet TAKE (1) TABLET THREE TIMES DAILY AS NEEDED. 60 tablet 10  ? alfuzosin (UROXATRAL) 10 MG 24 hr tablet Take 10 mg by mouth daily.    ? alprazolam (XANAX) 2 MG tablet Take 1 tablet (2 mg total) by mouth in the morning, at noon, in the evening, and at bedtime. 120 tablet 2  ? baclofen (LIORESAL) 10 MG tablet Take 10 mg by mouth 2 (two) times daily as needed.    ? clobetasol (TEMOVATE) 0.05 % external  solution Apply 1 application topically 2 (two) times daily. 50 mL 9  ? cycloSPORINE (RESTASIS) 0.05 % ophthalmic emulsion Place 1 drop into both eyes 2 (two) times daily.    ? EPINEPHrine 0.3 mg/0.3 mL IJ SOAJ injection Inject into the muscle.    ? gabapentin (NEURONTIN) 600 MG tablet Take 600 mg by mouth 4 (four) times daily.    ? HUMIRA PEN 40 MG/0.4ML PNKT INJECT '40MG'$  SUBCUTANEOUSLY EVERY 2 WEEKS AS DIRECTED. 2 each 10  ? lamoTRIgine (LAMICTAL)  200 MG tablet TAKE  (1)  TABLET TWICE A DAY. 60 tablet 2  ? Lurasidone HCl (LATUDA) 120 MG TABS Take 1 tablet (120 mg total) by mouth daily. 90 tablet 2  ? Multiple Vitamins-Minerals (MENS 50+ MULTI VITAMIN/MIN) TABS Take 1 tablet by mouth every morning.    ? Omega-3 Fatty Acids (COROMEGA OMEGA 3 SQUEEZE) EMUL Take by mouth.    ? omeprazole (PRILOSEC) 20 MG capsule Take 20 mg by mouth daily.    ? Potassium Gluconate 2.5 MEQ TABS Take by mouth.    ? VEMLIDY 25 MG TABS Take 1 tablet by mouth daily.    ? ?No current facility-administered medications for this visit.  ? ? ? ?Musculoskeletal: ?Strength & Muscle Tone: within normal limits ?Gait & Station: normal ?Patient leans: N/A ? ?Psychiatric Specialty Exam: ?Review of Systems  ?Constitutional:  Positive for fatigue.  ?Psychiatric/Behavioral:  Positive for dysphoric mood.   ?All other systems reviewed and are negative.  ?There were no vitals taken for this visit.There is no height or weight on file to calculate BMI.  ?General Appearance: Casual and Fairly Groomed  ?Eye Contact:  Good  ?Speech:  Clear and Coherent  ?Volume:  Normal  ?Mood:  Dysphoric  ?Affect:  Flat  ?Thought Process:  Goal Directed  ?Orientation:  Full (Time, Place, and Person)  ?Thought Content: Rumination   ?Suicidal Thoughts:  No  ?Homicidal Thoughts:  No  ?Memory:  Immediate;   Good ?Recent;   Good ?Remote;   Good  ?Judgement:  Good  ?Insight:  Fair  ?Psychomotor Activity:  Tremor  ?Concentration:  Concentration: Good and Attention Span: Good  ?Recall:   Good  ?Fund of Knowledge: Good  ?Language: Good  ?Akathisia:  No  ?Handed:  Right  ?AIMS (if indicated): not done  ?Assets:  Communication Skills ?Desire for Improvement ?Resilience ?Social Support ?Talents/Skills

## 2021-08-25 ENCOUNTER — Ambulatory Visit (INDEPENDENT_AMBULATORY_CARE_PROVIDER_SITE_OTHER): Payer: Medicare Other | Admitting: Psychiatry

## 2021-08-25 DIAGNOSIS — F3130 Bipolar disorder, current episode depressed, mild or moderate severity, unspecified: Secondary | ICD-10-CM

## 2021-08-25 NOTE — Progress Notes (Signed)
Virtual Visit via Video Note ? ?I connected with Roger Duran on 08/25/21 at 4:23 PM EDT  by a video enabled telemedicine application and verified that I am speaking with the correct person using two identifiers. ? ?Location: ?Patient: Home ?Provider: [facility] [place] [facility]  ?  ? ?I provided 35 minutes of non-face-to-face time during this encounter. ? ? ?Archer Moist E Darchelle Nunes, LCSW ? ? ? ?THERAPIST PROGRESS NOTE ? ? ? ? ?Session Time: Wednesday 08/24/2021 4:23 PM - 4:58 PM  ? ? ?Participation Level: Active    ? ?Behavioral Response: Casual/ Alert/ ? ?Type of Therapy: Individual Therapy ? ?Treatment Goals addressed: Patient will practice behavioral activation skills 2-3 times per week for the next 12 weeks ? ?Progress on goals: Progressing ? ?Interventions: CBT and Supportive ? ?Summary: Roger Duran is a 51 y.o. male who  is referrred for services by psychiatrist Dr. Harrington Challenger for continuity of care. Patient has history of bipolar disorder and was seeing S. Schneidmiller for therapy. She now is out on medical leave. He reports one psychiatric hospitalization due to suicide attempt in 2009. He received outpatient treatment at [facility]. He eventually saw Ms. Schenidmiller and psychiatrist Dr. Harrington Challenger.  Patient reports he tends to have depressive and missed episodes.  He also reports constant anxiety along with paranoia and delusional thinking at times.  He reports being depressed most of the time and having to force himself out of bed. ? ?Patient last was seen via virtual visit about 6-7 weeks ago.  He reports less depressed mood and increased behavioral activation since last session.  He also reports improved sleep pattern and says medication changes made by psychiatrist Dr. Harrington Challenger have been helpful.  Patient states getting out more and reports recently enjoying going on a camping trip with his mother and friend.  He has plans to go to the beach this summer.  He has been more active around his home doing light household  task and recently begun mowing the yard.  He also has started walking.  He reports decreased stress regarding financial issues as he now is participating in a debt relief program.  He still reports poor motivation at times and difficulty maintaining consistency and structure.  He is particular concerned about this regarding weight management as he is self-conscious about his weight. ? ?Suicidal/Homicidal: No, without intent/plan ?  ?Therapist Response: reviewed symptoms, praised and reinforced patient's increased behavioral activation, discussed effects, discussed stressors, facilitated patient expressing thoughts and feelings, validated feelings, reviewed rationale for using daily planning to help maintain consistency and structure, developed plan with patient to use daily planning forms and to begin walking 3 days/week  ? ?Plan: Return in 2 weeks ? ?Diagnosis: Axis I: Bipolar, Depressed ? ?  Axis II: No diagnosis ? ?Collaboration of Care: Psychiatrist AEB patient working with psychiatrist Dr. Harrington Challenger, clinician reviewing chart. ? ?Patient/Guardian was advised Release of Information must be obtained prior to any record release in order to collaborate their care with an outside provider. Patient/Guardian was advised if they have not already done so to contact the registration department to sign all necessary forms in order for [place] to release information regarding their care.  ? ?Consent: Patient/Guardian gives verbal consent for treatment and assignment of benefits for services provided during this visit. Patient/Guardian expressed understanding and agreed to proceed.  ? ?[place], LCSW ?08/25/2021 ? ? ? ? ? ? ? ? ? ? ? ?

## 2021-09-09 ENCOUNTER — Ambulatory Visit (HOSPITAL_COMMUNITY): Payer: Medicare Other | Admitting: Psychiatry

## 2021-09-15 ENCOUNTER — Encounter (HOSPITAL_COMMUNITY): Payer: Self-pay | Admitting: Psychiatry

## 2021-09-15 ENCOUNTER — Telehealth (INDEPENDENT_AMBULATORY_CARE_PROVIDER_SITE_OTHER): Payer: Medicare Other | Admitting: Psychiatry

## 2021-09-15 DIAGNOSIS — F411 Generalized anxiety disorder: Secondary | ICD-10-CM | POA: Diagnosis not present

## 2021-09-15 DIAGNOSIS — F3130 Bipolar disorder, current episode depressed, mild or moderate severity, unspecified: Secondary | ICD-10-CM | POA: Diagnosis not present

## 2021-09-15 MED ORDER — TRAZODONE HCL 50 MG PO TABS: 50.0000 mg | ORAL_TABLET | Freq: Every day | ORAL | 2 refills | Status: DC

## 2021-09-15 MED ORDER — LURASIDONE HCL 120 MG PO TABS: 120.0000 mg | ORAL_TABLET | Freq: Every day | ORAL | 2 refills | Status: DC

## 2021-09-15 MED ORDER — LAMOTRIGINE 200 MG PO TABS: ORAL_TABLET | ORAL | 2 refills | Status: DC

## 2021-09-15 MED ORDER — ALPRAZOLAM 2 MG PO TABS: 2.0000 mg | ORAL_TABLET | Freq: Four times a day (QID) | ORAL | 2 refills | Status: DC

## 2021-09-15 MED ORDER — FLUOXETINE HCL 20 MG PO CAPS: 20.0000 mg | ORAL_CAPSULE | Freq: Every day | ORAL | 2 refills | Status: DC

## 2021-09-15 NOTE — Progress Notes (Signed)
Virtual Visit via Telephone Note ? ?I connected with [name] on 09/15/21 at  4:00 PM EDT by telephone and verified that I am speaking with the correct person using two identifiers. ? ?Location: ?Patient: home ?Provider: office ?  ?I discussed the limitations, risks, security and privacy concerns of performing an evaluation and management service by telephone and the availability of in person appointments. I also discussed with the patient that there may be a patient responsible charge related to this service. The patient expressed understanding and agreed to proceed. ? ? ? ?  ?I discussed the assessment and treatment plan with the patient. The patient was provided an opportunity to ask questions and all were answered. The patient agreed with the plan and demonstrated an understanding of the instructions. ?  ?The patient was advised to call back or seek an in-person evaluation if the symptoms worsen or if the condition fails to improve as anticipated. ? ?I provided 20 minutes of non-face-to-face time during this encounter. ? ? ?[name], MD ? ?BH MD/PA/NP OP Progress Note ? ?09/15/2021 4:33 PM ?[name]  ?MRN:  [id number] ? ?Chief Complaint:  ?Chief Complaint  ?Patient presents with  ? Depression  ? Anxiety  ? Manic Behavior  ? ?HPI: This patient is a 51 year old single white male who lives alone in [place].  He is on disability for bipolar disorder. ? ?The patient returns for 4 weeks regarding his bipolar disorder depression and anxiety.  We had switched his medication to Prozac last time and has made a big difference by his report.  He has more energy and he is doing things with friends and family.  He is going to car races and attending church.  He seems much more upbeat.  He denies being manic having racing thoughts or problems sleeping.  His anxiety is under good control.  Denies thoughts of self-harm or suicide ?Visit Diagnosis:  ?  ICD-10-CM   ?1. Bipolar I disorder, most recent episode  depressed ([place])  F31.30   ?  ?2. GAD (generalized anxiety disorder)  F41.1   ?  ? ? ?Past Psychiatric History: Hospitalization in 2009 after suicide attempt ? ?Past Medical History:  ?Past Medical History:  ?Diagnosis Date  ? Anxiety   ? Bipolar 1 disorder ([place])   ? Depression   ? Mania ([place])   ? Psoriasis   ?  ?Past Surgical History:  ?Procedure Laterality Date  ? APPENDECTOMY    ? back procedures    ? COLONOSCOPY N/A 11/14/2014  ? Procedure: COLONOSCOPY;  Surgeon: Rogene Houston, MD;  Location: [facility];  Service: Endoscopy;  Laterality: N/A;  11:10 - moved to 8:30 Lelon Frohlich to notify  ? TONSILLECTOMY    ? WRIST SURGERY    ? ? ?Family Psychiatric History: see below ? ?Family History:  ?Family History  ?Problem Relation Age of Onset  ? Hyperlipidemia Mother   ? Heart disease Father   ? Anxiety disorder Father   ? Bipolar disorder Maternal Grandfather   ? ? ?Social History:  ?Social History  ? ?Socioeconomic History  ? Marital status: Single  ?  Spouse name: Not on file  ? Number of children: Not on file  ? Years of education: Not on file  ? Highest education level: Not on file  ?Occupational History  ? Not on file  ?Tobacco Use  ? Smoking status: Former  ?  Types: Cigarettes, E-cigarettes  ?  Quit date: 11/21/2019  ?  Years since quitting: 1.8  ? Smokeless tobacco: Former  ?Vaping Use  ? Vaping Use: Never used  ?Substance and Sexual Activity  ? Alcohol use: No  ?  Alcohol/week: 0.0 standard drinks  ?  Comment: rarely  ? Drug use: No  ?  Comment: 1995-2000 PER PT HE DID A LOT OF COCAINE AND PILLS AND DRANK A LOT  ? Sexual activity: Not Currently  ?  Partners: Male  ?  Birth control/protection: Condom  ?Other Topics Concern  ? Not on file  ?Social History Narrative  ? Not on file  ? ?Social Determinants of Health  ? ?Financial Resource Strain: Not on file  ?Food Insecurity: Not on file  ?Transportation Needs: Not on file  ?Physical Activity: Not on file  ?Stress: Not on file  ?Social Connections: Not on file   ? ? ?Allergies:  ?Allergies  ?Allergen Reactions  ? Apple Juice Itching and Swelling  ?  Throat and mouth itching and swelling ?Throat and mouth itching and swelling  ? Bee Venom Anaphylaxis and Other (See Comments)  ?  Weak, shaky, dizzy. ?Weak, shaky, dizzy.  ? Morphine Palpitations  ? Aspirin Nausea And Vomiting  ? Sulfa Antibiotics Nausea And Vomiting  ? Dicyclomine Other (See Comments)  ?  Aka Dicyclomine ?Aka Dicyclomine  ? Other Other (See Comments)  ?  Aka Dicyclomine  ? Penicillins Itching and Rash  ? ? ?Metabolic Disorder Labs: ?No results found for: HGBA1C, MPG ?No results found for: PROLACTIN ?No results found for: CHOL, TRIG, HDL, CHOLHDL, VLDL, LDLCALC ?No results found for: TSH ? ?Therapeutic Level Labs: ?Lab Results  ?Component Value Date  ? LITHIUM 1.10 10/15/2014  ? ?No results found for: VALPROATE ?No components found for:  CBMZ ? ?Current Medications: ?Current Outpatient Medications  ?Medication Sig Dispense Refill  ? acetaminophen (TYLENOL) 500 MG tablet Take by mouth.    ? acyclovir (ZOVIRAX) 400 MG tablet TAKE (1) TABLET THREE TIMES DAILY AS NEEDED. 60 tablet 10  ? alfuzosin (UROXATRAL) 10 MG 24 hr tablet Take 10 mg by mouth daily.    ? alprazolam (XANAX) 2 MG tablet Take 1 tablet (2 mg total) by mouth in the morning, at noon, in the evening, and at bedtime. 120 tablet 2  ? baclofen (LIORESAL) 10 MG tablet Take 10 mg by mouth 2 (two) times daily as needed.    ? clobetasol (TEMOVATE) 0.05 % external solution Apply 1 application topically 2 (two) times daily. 50 mL 9  ? cycloSPORINE (RESTASIS) 0.05 % ophthalmic emulsion Place 1 drop into both eyes 2 (two) times daily.    ? EPINEPHrine 0.3 mg/0.3 mL IJ SOAJ injection Inject into the muscle.    ? FLUoxetine (PROZAC) 20 MG capsule Take 1 capsule (20 mg total) by mouth daily. 30 capsule 2  ? gabapentin (NEURONTIN) 600 MG tablet Take 600 mg by mouth 4 (four) times daily.    ? HUMIRA PEN 40 MG/0.4ML PNKT INJECT '40MG'$  SUBCUTANEOUSLY EVERY 2 WEEKS AS  DIRECTED. 2 each 10  ? lamoTRIgine (LAMICTAL) 200 MG tablet TAKE  (1)  TABLET TWICE A DAY. 60 tablet 2  ? Lurasidone HCl (LATUDA) 120 MG TABS Take 1 tablet (120 mg total) by mouth daily. 90 tablet 2  ? Multiple Vitamins-Minerals (MENS 50+ MULTI VITAMIN/MIN) TABS Take 1 tablet by mouth every morning.    ? Omega-3 Fatty Acids (COROMEGA OMEGA 3 SQUEEZE) EMUL Take by mouth.    ? omeprazole (PRILOSEC) 20 MG capsule Take 20 mg by mouth daily.    ?  Potassium Gluconate 2.5 MEQ TABS Take by mouth.    ? traZODone (DESYREL) 50 MG tablet Take 1 tablet (50 mg total) by mouth at bedtime. 30 tablet 2  ? VEMLIDY 25 MG TABS Take 1 tablet by mouth daily.    ? ?No current facility-administered medications for this visit.  ? ? ? ?Musculoskeletal: ?Strength & Muscle Tone: within normal limits ?Gait & Station: normal ?Patient leans: N/A ? ?Psychiatric Specialty Exam: ?Review of Systems  ?Neurological:  Positive for tremors.  ?All other systems reviewed and are negative.  ?There were no vitals taken for this visit.There is no height or weight on file to calculate BMI.  ?General Appearance: na  ?Eye Contact:  NA  ?Speech:  Clear and Coherent  ?Volume:  Normal  ?Mood:  Euthymic  ?Affect:  Appropriate and Congruent  ?Thought Process:  Goal Directed  ?Orientation:  Full (Time, Place, and Person)  ?Thought Content: WDL   ?Suicidal Thoughts:  No  ?Homicidal Thoughts:  No  ?Memory:  Immediate;   Good ?Recent;   Good ?Remote;   Good  ?Judgement:  Good  ?Insight:  Good  ?Psychomotor Activity:  Tremor  ?Concentration:  Concentration: Good and Attention Span: Good  ?Recall:  Good  ?Fund of Knowledge: Good  ?Language: Good  ?Akathisia:  No  ?Handed:  Right  ?AIMS (if indicated): not done  ?Assets:  Communication Skills ?Desire for Improvement ?Resilience ?Social Support ?Talents/Skills  ?ADL's:  Intact  ?Cognition: WNL  ?Sleep:  Good  ? ?Screenings: ?PHQ2-9   ? ?Flowsheet Row Video Visit from 09/15/2021 in [place] Video Visit from 08/12/2021 in [place] Video Visit from 07/15/2021 in [facility] Video Visit from 05/10/2021 in [facility]

## 2021-09-23 ENCOUNTER — Ambulatory Visit (INDEPENDENT_AMBULATORY_CARE_PROVIDER_SITE_OTHER): Payer: Medicare Other | Admitting: Psychiatry

## 2021-09-23 DIAGNOSIS — F3130 Bipolar disorder, current episode depressed, mild or moderate severity, unspecified: Secondary | ICD-10-CM | POA: Diagnosis not present

## 2021-09-23 DIAGNOSIS — F411 Generalized anxiety disorder: Secondary | ICD-10-CM | POA: Diagnosis not present

## 2021-09-23 NOTE — Progress Notes (Signed)
Virtual Visit via Video Note  I connected with Roger Duran on 09/23/21 at 3:10 PM EDT by a video enabled telemedicine application and verified that I am speaking with the correct person using two identifiers.  Location: Patient: Home Provider: [place] office    I discussed the limitations of evaluation and management by telemedicine and the availability of in person appointments. The patient expressed understanding and agreed to proceed.   I provided 45 minutes of non-face-to-face time during this encounter.   [name], LCSW    THERAPIST PROGRESS NOTE     Session Time: Thursday 09/23/2021 3:10 PM - 3:55 PM    Participation Level: Active     Behavioral Response: Casual/ Alert/  Type of Therapy: Individual Therapy  Treatment Goals addressed: Patient will practice behavioral activation skills 2-3 times per week for the next 12 weeks  Progress on goals: Progressing  Interventions: CBT and Supportive  Summary: Roger Duran is a 51 y.o. male who  is referrred for services by psychiatrist Dr. Harrington Challenger for continuity of care. Patient has history of bipolar disorder and was seeing S. Schneidmiller for therapy. She now is out on medical leave. He reports one psychiatric hospitalization due to suicide attempt in 2009. He received outpatient treatment at [facility]. He eventually saw Ms. Schenidmiller and psychiatrist Dr. Harrington Challenger.  Patient reports he tends to have depressive and missed episodes.  He also reports constant anxiety along with paranoia and delusional thinking at times.  He reports being depressed most of the time and having to force himself out of bed.  Patient last was seen via virtual visit about 4 weeks ago.  He reports continued  less depressed mood and increased behavioral activation since last session.  Per his report, he has been attending church regularly, going to the races on Saturday nights, and has been walking 3-4 times per week.  He continues  to visit his parents daily.  He also has developed more social contact by going out to dinner or doing activities with his neighbors.  He also reports he has been mowing the grass.  Patient reports he still has not been using daily planning but tends to select activities to do when he wakes up in the mornings.   Suicidal/Homicidal: No, without intent/plan   Therapist Response: reviewed symptoms, praised and reinforced patient's increased behavioral activation/increased physical activity (walking), discussed effects, discussed stressors, facilitated patient expressing thoughts and feelings, validated feelings, reviewed rationale for using daily planning to help maintain consistency and structure, developed plan with patient to use daily planning forms and to continue walking 3 days/week, also developed plan with patient to review benefits/consequences of walking list developed in previous session to help maintain consistent motivation, also encouraged patient to follow through with his plans to start attending the gym.  Plan: Return in 2 weeks  Diagnosis: Axis I: Bipolar, Depressed    Axis II: No diagnosis  Collaboration of Care: Psychiatrist AEB patient working with psychiatrist Dr. Harrington Challenger, clinician reviewing chart.  Patient/Guardian was advised Release of Information must be obtained prior to any record release in order to collaborate their care with an outside provider. Patient/Guardian was advised if they have not already done so to contact the registration department to sign all necessary forms in order for [place] to release information regarding their care.   Consent: Patient/Guardian gives verbal consent for treatment and assignment of benefits for services provided during this visit. Patient/Guardian expressed understanding and agreed to proceed.   Jessey Huyett  Placido Sou, LCSW 09/23/2021

## 2021-09-28 ENCOUNTER — Telehealth (INDEPENDENT_AMBULATORY_CARE_PROVIDER_SITE_OTHER): Payer: Medicare Other | Admitting: Psychiatry

## 2021-09-28 ENCOUNTER — Encounter (HOSPITAL_COMMUNITY): Payer: Self-pay | Admitting: Psychiatry

## 2021-09-28 DIAGNOSIS — F411 Generalized anxiety disorder: Secondary | ICD-10-CM | POA: Diagnosis not present

## 2021-09-28 DIAGNOSIS — F3130 Bipolar disorder, current episode depressed, mild or moderate severity, unspecified: Secondary | ICD-10-CM

## 2021-09-28 MED ORDER — BUPROPION HCL ER (XL) 150 MG PO TB24
150.0000 mg | ORAL_TABLET | ORAL | 2 refills | Status: DC
Start: 2021-09-28 — End: 2021-12-22

## 2021-09-28 NOTE — Progress Notes (Signed)
Virtual Visit via Video Note  I connected with Roger Duran on 09/28/21 at  8:40 AM EDT by a video enabled telemedicine application and verified that I am speaking with the correct person using two identifiers.  Location: Patient: home Provider: office   I discussed the limitations of evaluation and management by telemedicine and the availability of in person appointments. The patient expressed understanding and agreed to proceed.     I discussed the assessment and treatment plan with the patient. The patient was provided an opportunity to ask questions and all were answered. The patient agreed with the plan and demonstrated an understanding of the instructions.   The patient was advised to call back or seek an in-person evaluation if the symptoms worsen or if the condition fails to improve as anticipated.  I provided 15 minutes of non-face-to-face time during this encounter.   Roger Spiller, MD  [facility] MD/PA/NP OP Progress Note  09/28/2021 8:58 AM Roger Duran  MRN:  [id number]  Chief Complaint:  Chief Complaint  Patient presents with   Depression   Anxiety   Manic Behavior   HPI: This patient is a 51 year old single white male who lives alone in [place].  He is on disability for bipolar disorder.  The patient returns for follow-up after about 10 days.  He states that he had some questions for me.  He states that he was having some difficulties with urination and he was placed on Cialis which is also helped his erectile dysfunction.  However now he is taking a long time to come to orgasm with his partner.  His family doctor suggested Wellbutrin.  I explained that I think a lot of this has to do with his psychiatric medications but were finally on a combination that is helped him feel better.  I am willing to try a low-dose of Wellbutrin to see if it will help but he is in agreement.  He does have a history of tremor and sometimes Wellbutrin can make tremor worse and he stated  that he would let me know right away if this happened.  Right now he denies any symptoms of depression thoughts of self-harm or suicidal ideation. Visit Diagnosis:    ICD-10-CM   1. Bipolar I disorder, most recent episode depressed ([place])  F31.30     2. GAD (generalized anxiety disorder)  F41.1       Past Psychiatric History: Hospitalization in 2009 after suicide attempt  Past Medical History:  Past Medical History:  Diagnosis Date   Anxiety    Bipolar 1 disorder ([place])    Depression    Mania ([place])    Psoriasis     Past Surgical History:  Procedure Laterality Date   APPENDECTOMY     back procedures     COLONOSCOPY N/A 11/14/2014   Procedure: COLONOSCOPY;  Surgeon: Rogene Houston, MD;  [facility];  Service: Endoscopy;  Laterality: N/A;  11:10 - moved to 8:30 - Ann to notify   TONSILLECTOMY     WRIST SURGERY      Family Psychiatric History: See below  Family History:  Family History  Problem Relation Age of Onset   Hyperlipidemia Mother    Heart disease Father    Anxiety disorder Father    Bipolar disorder Maternal Grandfather     Social History:  Social History   Socioeconomic History   Marital status: Single    Spouse name: Not on file   Number of children: Not on  file   Years of education: Not on file   Highest education level: Not on file  Occupational History   Not on file  Tobacco Use   Smoking status: Former    Types: Cigarettes, E-cigarettes    Quit date: 11/21/2019    Years since quitting: 1.8   Smokeless tobacco: Former  Scientific laboratory technician Use: Never used  Substance and Sexual Activity   Alcohol use: No    Alcohol/week: 0.0 standard drinks    Comment: rarely   Drug use: No    Comment: 1995-2000 PER PT HE DID A LOT OF COCAINE AND PILLS AND DRANK A LOT   Sexual activity: Not Currently    Partners: Male    Birth control/protection: Condom  Other Topics Concern   Not on file  Social History Narrative   Not on file   Social  Determinants of Health   Financial Resource Strain: Not on file  Food Insecurity: Not on file  Transportation Needs: Not on file  Physical Activity: Not on file  Stress: Not on file  Social Connections: Not on file    Allergies:  Allergies  Allergen Reactions   Apple Juice Itching and Swelling    Throat and mouth itching and swelling Throat and mouth itching and swelling   Bee Venom Anaphylaxis and Other (See Comments)    Weak, shaky, dizzy. Weak, shaky, dizzy.   Morphine Palpitations   Aspirin Nausea And Vomiting   Sulfa Antibiotics Nausea And Vomiting   Dicyclomine Other (See Comments)    Aka Dicyclomine Aka Dicyclomine   Other Other (See Comments)    Aka Dicyclomine   Penicillins Itching and Rash    Metabolic Disorder Labs: No results found for: HGBA1C, MPG No results found for: PROLACTIN No results found for: CHOL, TRIG, HDL, CHOLHDL, VLDL, LDLCALC No results found for: TSH  Therapeutic Level Labs: Lab Results  Component Value Date   LITHIUM 1.10 10/15/2014   No results found for: VALPROATE No components found for:  CBMZ  Current Medications: Current Outpatient Medications  Medication Sig Dispense Refill   acetaminophen (TYLENOL) 500 MG tablet Take by mouth.     acyclovir (ZOVIRAX) 400 MG tablet TAKE (1) TABLET THREE TIMES DAILY AS NEEDED. 60 tablet 10   alprazolam (XANAX) 2 MG tablet Take 1 tablet (2 mg total) by mouth in the morning, at noon, in the evening, and at bedtime. 120 tablet 2   baclofen (LIORESAL) 10 MG tablet Take 10 mg by mouth 2 (two) times daily as needed.     buPROPion (WELLBUTRIN XL) 150 MG 24 hr tablet Take 1 tablet (150 mg total) by mouth every morning. 30 tablet 2   clobetasol (TEMOVATE) 0.05 % external solution Apply 1 application topically 2 (two) times daily. 50 mL 9   cycloSPORINE (RESTASIS) 0.05 % ophthalmic emulsion Place 1 drop into both eyes 2 (two) times daily.     EPINEPHrine 0.3 mg/0.3 mL IJ SOAJ injection Inject into the  muscle.     FLUoxetine (PROZAC) 20 MG capsule Take 1 capsule (20 mg total) by mouth daily. 30 capsule 2   gabapentin (NEURONTIN) 600 MG tablet Take 600 mg by mouth 4 (four) times daily.     HUMIRA PEN 40 MG/0.4ML PNKT INJECT '40MG'$  SUBCUTANEOUSLY EVERY 2 WEEKS AS DIRECTED. 2 each 10   lamoTRIgine (LAMICTAL) 200 MG tablet TAKE  (1)  TABLET TWICE A DAY. 60 tablet 2   Lurasidone HCl (LATUDA) 120 MG TABS Take 1 tablet (120  mg total) by mouth daily. 90 tablet 2   Multiple Vitamins-Minerals (MENS 50+ MULTI VITAMIN/MIN) TABS Take 1 tablet by mouth every morning.     Omega-3 Fatty Acids (COROMEGA OMEGA 3 SQUEEZE) EMUL Take by mouth.     omeprazole (PRILOSEC) 20 MG capsule Take 20 mg by mouth daily.     Potassium Gluconate 2.5 MEQ TABS Take by mouth.     traZODone (DESYREL) 50 MG tablet Take 1 tablet (50 mg total) by mouth at bedtime. 30 tablet 2   VEMLIDY 25 MG TABS Take 1 tablet by mouth daily.     No current facility-administered medications for this visit.     Musculoskeletal: Strength & Muscle Tone: within normal limits Gait & Station: normal Patient leans: N/A  Psychiatric Specialty Exam: Review of Systems  Genitourinary:        Sexual dysfunction   There were no vitals taken for this visit.There is no height or weight on file to calculate BMI.  General Appearance: Casual and Fairly Groomed  Eye Contact:  Good  Speech:  Garbled  Volume:  Normal  Mood:  Euthymic  Affect:  Appropriate and Congruent  Thought Process:  Goal Directed  Orientation:  Full (Time, Place, and Person)  Thought Content: WDL   Suicidal Thoughts:  No  Homicidal Thoughts:  No  Memory:  Immediate;   Good Recent;   Good Remote;   Good  Judgement:  Good  Insight:  Good  Psychomotor Activity:  Normal  Concentration:  Concentration: Good and Attention Span: Good  Recall:  Good  Fund of Knowledge: Good  Language: Good  Akathisia:  No  Handed:  Right  AIMS (if indicated): not done  Assets:  Communication  Skills Desire for Improvement Resilience Social Support Talents/Skills  ADL's:  Intact  Cognition: WNL  Sleep:  Good   Screenings: PHQ2-9    Flowsheet Row Video Visit from 09/15/2021 in [place] Video Visit from 08/12/2021 in [place] Video Visit from 07/15/2021 in [place] Video Visit from 05/10/2021 in [facility] Video Visit from 03/29/2021 in [facility]  PHQ-2 Total Score 0 2 3 0 1  PHQ-9 Total Score -- 7 12 -- --      Flowsheet Row Video Visit from 09/15/2021 in [place] Video Visit from 08/12/2021 in [place] Video Visit from 07/15/2021 in [place] No Risk No Risk No Risk        Assessment and Plan: This patient is a 51 year old male with a history of bipolar disorder and anxiety.  He seems to be doing much better in terms of mood but is having sexual dysfunction.  He will continue Prozac 20 mg daily for depression, Lamictal 200 mg daily and lurasidone 120 mg daily for mood stabilization, Xanax 2 mg 4 times daily for anxiety and trazodone 50 mg at bedtime for sleep.  We will cautiously add Wellbutrin XL 150 mg daily for sexual dysfunction.  If this makes his tremors worse he will let me know.  Otherwise he will return to see me in 4 weeks  Collaboration of Care: Collaboration of Care: Referral or follow-up with counselor/therapist AEB patient will continue follow-up with therapist Maurice Small  Patient/Guardian was advised Release of Information must be obtained prior to any record release in order to collaborate their care with an outside provider. Patient/Guardian  was advised if they have not already done so to  contact the registration department to sign all necessary forms in order for [place] to release information regarding their care.   Consent: Patient/Guardian gives verbal consent for treatment and assignment of benefits for services provided during this visit. Patient/Guardian expressed understanding and agreed to proceed.    Roger Spiller, MD 09/28/2021, 8:58 AM

## 2021-10-07 ENCOUNTER — Ambulatory Visit (HOSPITAL_COMMUNITY): Payer: Medicare Other | Admitting: Psychiatry

## 2021-10-18 ENCOUNTER — Telehealth (HOSPITAL_COMMUNITY): Payer: Self-pay
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
## 2021-10-18 NOTE — Telephone Encounter (Signed)
Okay; thanks.

## 2021-10-19 ENCOUNTER — Encounter (HOSPITAL_COMMUNITY): Payer: Self-pay | Admitting: Psychiatry

## 2021-10-19 ENCOUNTER — Telehealth (INDEPENDENT_AMBULATORY_CARE_PROVIDER_SITE_OTHER): Payer: Medicare Other | Admitting: Psychiatry

## 2021-10-19 DIAGNOSIS — F411 Generalized anxiety disorder: Secondary | ICD-10-CM

## 2021-10-19 DIAGNOSIS — F3130 Bipolar disorder, current episode depressed, mild or moderate severity, unspecified: Secondary | ICD-10-CM | POA: Diagnosis not present
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
## 2021-10-21 ENCOUNTER — Ambulatory Visit (INDEPENDENT_AMBULATORY_CARE_PROVIDER_SITE_OTHER): Payer: Medicare Other | Admitting: Psychiatry

## 2021-10-21 DIAGNOSIS — F411 Generalized anxiety disorder: Secondary | ICD-10-CM

## 2021-10-21 DIAGNOSIS — F3130 Bipolar disorder, current episode depressed, mild or moderate severity, unspecified: Secondary | ICD-10-CM
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
## 2021-10-28 ENCOUNTER — Telehealth (HOSPITAL_COMMUNITY): Payer: Medicare Other | Admitting: Psychiatry

## 2021-11-17 DIAGNOSIS — F332 Major depressive disorder, recurrent severe without psychotic features: Secondary | ICD-10-CM

## 2021-11-18 ENCOUNTER — Ambulatory Visit (INDEPENDENT_AMBULATORY_CARE_PROVIDER_SITE_OTHER): Payer: Medicare Other | Admitting: Psychiatry

## 2021-11-18 DIAGNOSIS — F3130 Bipolar disorder, current episode depressed, mild or moderate severity, unspecified: Secondary | ICD-10-CM

## 2021-11-18 DIAGNOSIS — F411 Generalized anxiety disorder: Secondary | ICD-10-CM | POA: Diagnosis not present
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
## 2021-12-01 ENCOUNTER — Telehealth: Payer: Self-pay | Admitting: *Deleted

## 2021-12-01 ENCOUNTER — Encounter: Payer: Self-pay | Admitting: Physician Assistant

## 2021-12-01 DIAGNOSIS — L400 Psoriasis vulgaris: Secondary | ICD-10-CM

## 2021-12-01 MED ORDER — HUMIRA (2 PEN) 40 MG/0.4ML ~~LOC~~ AJKT: AUTO-INJECTOR | SUBCUTANEOUS | 8 refills | Status: AC

## 2021-12-01 NOTE — Telephone Encounter (Signed)
Patient sent message stating he needed refill on humira- sent to [facility].

## 2021-12-02 ENCOUNTER — Ambulatory Visit (HOSPITAL_COMMUNITY): Payer: Medicare Other | Admitting: Psychiatry

## 2021-12-02 ENCOUNTER — Encounter (HOSPITAL_COMMUNITY): Payer: Self-pay

## 2021-12-02 ENCOUNTER — Telehealth (HOSPITAL_COMMUNITY): Payer: Self-pay | Admitting: Psychiatry

## 2021-12-02 NOTE — Telephone Encounter (Signed)
Called patient let him know refills are good for one year- new dermatologist will take over care and send in new prescription. Patient understood.

## 2021-12-02 NOTE — Telephone Encounter (Signed)
Therapist attempted to contact patient via text through caregility platform for scheduled appointment, no response.  Therapist called patient, left message indicating attempt, and requesting patient call office. 

## 2021-12-16 ENCOUNTER — Encounter (HOSPITAL_COMMUNITY): Payer: Self-pay

## 2021-12-16 ENCOUNTER — Ambulatory Visit (HOSPITAL_COMMUNITY): Payer: Medicare Other | Admitting: Psychiatry

## 2021-12-21 ENCOUNTER — Telehealth (HOSPITAL_COMMUNITY): Payer: Medicare Other | Admitting: Psychiatry

## 2021-12-22 ENCOUNTER — Encounter (HOSPITAL_COMMUNITY): Payer: Self-pay | Admitting: Psychiatry

## 2021-12-22 ENCOUNTER — Telehealth (INDEPENDENT_AMBULATORY_CARE_PROVIDER_SITE_OTHER): Payer: Medicare Other | Admitting: Psychiatry

## 2021-12-22 DIAGNOSIS — F3130 Bipolar disorder, current episode depressed, mild or moderate severity, unspecified: Secondary | ICD-10-CM | POA: Diagnosis not present

## 2021-12-22 DIAGNOSIS — F411 Generalized anxiety disorder: Secondary | ICD-10-CM | POA: Diagnosis not present

## 2021-12-22 MED ORDER — ALPRAZOLAM 2 MG PO TABS: 2.0000 mg | ORAL_TABLET | Freq: Four times a day (QID) | ORAL | 2 refills | Status: DC

## 2021-12-22 MED ORDER — LURASIDONE HCL 120 MG PO TABS: 120.0000 mg | ORAL_TABLET | Freq: Every day | ORAL | 2 refills | Status: DC

## 2021-12-22 MED ORDER — FLUOXETINE HCL 20 MG PO CAPS: 20.0000 mg | ORAL_CAPSULE | Freq: Every day | ORAL | 2 refills | Status: DC

## 2021-12-22 MED ORDER — BUPROPION HCL ER (XL) 150 MG PO TB24: 150.0000 mg | ORAL_TABLET | ORAL | 2 refills | Status: DC

## 2021-12-22 MED ORDER — LAMOTRIGINE 200 MG PO TABS
ORAL_TABLET | ORAL | 2 refills | Status: DC
Start: 2021-12-22 — End: 2022-03-21

## 2021-12-22 NOTE — Progress Notes (Signed)
Virtual Visit via Video Note  I connected with Roger Duran on 12/22/21 at  1:00 PM EDT by a video enabled telemedicine application and verified that I am speaking with the correct person using two identifiers.  Location: Patient: home Provider: office   I discussed the limitations of evaluation and management by telemedicine and the availability of in person appointments. The patient expressed understanding and agreed to proceed.     I discussed the assessment and treatment plan with the patient. The patient was provided an opportunity to ask questions and all were answered. The patient agreed with the plan and demonstrated an understanding of the instructions.   The patient was advised to call back or seek an in-person evaluation if the symptoms worsen or if the condition fails to improve as anticipated.  I provided 12 minutes of non-face-to-face time during this encounter.   [name], MD  [facility] MD/PA/NP OP Progress Note  12/22/2021 1:14 PM Roger Duran  MRN:  [id number]  Chief Complaint:  Chief Complaint  Patient presents with   Depression   Manic Behavior   Anxiety   Follow-up   HPI: This patient is a 51 year old single white male who lives with a roommate in [place].  He is on disability for bipolar disorder.  The patient returns for follow-up after 2 months.  He states that he is generally been doing well.  He states that his mood has been stable.  He denies significant depression thoughts of self-harm or suicide.  He is trying to help his brother who was recently hospitalized for substance abuse problems.  He has been sleeping well.  He denies any manic symptoms currently.  He does think his medications have been working well for him and he is sleeping without the trazodone. Visit Diagnosis:    ICD-10-CM   1. Bipolar I disorder, most recent episode depressed ([place])  F31.30     2. GAD (generalized anxiety disorder)  F41.1       Past Psychiatric History:  Hospitalization in 2009 after suicide attempt, otherwise outpatient treatment  Past Medical History:  Past Medical History:  Diagnosis Date   Anxiety    Bipolar 1 disorder ([place])    Depression    Mania ([place])    Psoriasis     Past Surgical History:  Procedure Laterality Date   APPENDECTOMY     back procedures     COLONOSCOPY N/A 11/14/2014   Procedure: COLONOSCOPY;  Surgeon: Rogene Houston, MD;  Location: [facility];  Service: Endoscopy;  Laterality: N/A;  11:10 - moved to 8:30 - Ann to notify   TONSILLECTOMY     WRIST SURGERY      Family Psychiatric History: See below  Family History:  Family History  Problem Relation Age of Onset   Hyperlipidemia Mother    Heart disease Father    Anxiety disorder Father    Bipolar disorder Maternal Grandfather     Social History:  Social History   Socioeconomic History   Marital status: Single    Spouse name: Not on file   Number of children: Not on file   Years of education: Not on file   Highest education level: Not on file  Occupational History   Not on file  Tobacco Use   Smoking status: Former    Types: Cigarettes, E-cigarettes    Quit date: 11/21/2019    Years since quitting: 2.0   Smokeless tobacco: Former  Scientific laboratory technician Use: Never  used  Substance and Sexual Activity   Alcohol use: No    Alcohol/week: 0.0 standard drinks of alcohol    Comment: rarely   Drug use: No    Comment: 1995-2000 PER PT HE DID A LOT OF COCAINE AND PILLS AND DRANK A LOT   Sexual activity: Not Currently    Partners: Male    Birth control/protection: Condom  Other Topics Concern   Not on file  Social History Narrative   Not on file   Social Determinants of Health   Financial Resource Strain: Not on file  Food Insecurity: Not on file  Transportation Needs: Not on file  Physical Activity: Not on file  Stress: Not on file  Social Connections: Not on file    Allergies:  Allergies  Allergen Reactions   Apple Juice Itching and  Swelling    Throat and mouth itching and swelling Throat and mouth itching and swelling   Bee Venom Anaphylaxis and Other (See Comments)    Weak, shaky, dizzy. Weak, shaky, dizzy.   Morphine Palpitations   Aspirin Nausea And Vomiting   Sulfa Antibiotics Nausea And Vomiting   Dicyclomine Other (See Comments)    Aka Dicyclomine Aka Dicyclomine   Other Other (See Comments)    Aka Dicyclomine   Penicillins Itching and Rash    Metabolic Disorder Labs: No results found for: "HGBA1C", "MPG" No results found for: "PROLACTIN" No results found for: "CHOL", "TRIG", "HDL", "CHOLHDL", "VLDL", "LDLCALC" No results found for: "TSH"  Therapeutic Level Labs: Lab Results  Component Value Date   LITHIUM 1.10 10/15/2014   No results found for: "VALPROATE" No results found for: "CBMZ"  Current Medications: Current Outpatient Medications  Medication Sig Dispense Refill   acetaminophen (TYLENOL) 500 MG tablet Take by mouth.     acyclovir (ZOVIRAX) 400 MG tablet TAKE (1) TABLET THREE TIMES DAILY AS NEEDED. 60 tablet 10   Adalimumab (HUMIRA PEN) 40 MG/0.4ML PNKT INJECT '40MG'$  SUBCUTANEOUSLY EVERY 2 WEEKS AS DIRECTED. 2 each 8   alprazolam (XANAX) 2 MG tablet Take 1 tablet (2 mg total) by mouth in the morning, at noon, in the evening, and at bedtime. 120 tablet 2   baclofen (LIORESAL) 10 MG tablet Take 10 mg by mouth 2 (two) times daily as needed.     buPROPion (WELLBUTRIN XL) 150 MG 24 hr tablet Take 1 tablet (150 mg total) by mouth every morning. 30 tablet 2   clobetasol (TEMOVATE) 0.05 % external solution Apply 1 application topically 2 (two) times daily. 50 mL 9   cycloSPORINE (RESTASIS) 0.05 % ophthalmic emulsion Place 1 drop into both eyes 2 (two) times daily.     EPINEPHrine 0.3 mg/0.3 mL IJ SOAJ injection Inject into the muscle.     FLUoxetine (PROZAC) 20 MG capsule Take 1 capsule (20 mg total) by mouth daily. 30 capsule 2   gabapentin (NEURONTIN) 600 MG tablet Take 600 mg by mouth 4 (four)  times daily.     lamoTRIgine (LAMICTAL) 200 MG tablet TAKE  (1)  TABLET TWICE A DAY. 60 tablet 2   Lurasidone HCl (LATUDA) 120 MG TABS Take 1 tablet (120 mg total) by mouth daily. 90 tablet 2   Multiple Vitamins-Minerals (MENS 50+ MULTI VITAMIN/MIN) TABS Take 1 tablet by mouth every morning.     Omega-3 Fatty Acids (COROMEGA OMEGA 3 SQUEEZE) EMUL Take by mouth.     omeprazole (PRILOSEC) 20 MG capsule Take 20 mg by mouth daily.     Potassium Gluconate 2.5 MEQ TABS  Take by mouth.     tadalafil (CIALIS) 5 MG tablet Take 5 mg by mouth daily.     VEMLIDY 25 MG TABS Take 1 tablet by mouth daily.     No current facility-administered medications for this visit.     Musculoskeletal: Strength & Muscle Tone: within normal limits Gait & Station: normal Patient leans: N/A  Psychiatric Specialty Exam: Review of Systems  Musculoskeletal:  Positive for arthralgias and myalgias.  Neurological:  Positive for tremors.  All other systems reviewed and are negative.   There were no vitals taken for this visit.There is no height or weight on file to calculate BMI.  General Appearance: Casual, Neat, and Well Groomed  Eye Contact:  Good  Speech:  Clear and Coherent  Volume:  Normal  Mood:  Euthymic  Affect:  Congruent  Thought Process:  Goal Directed  Orientation:  Full (Time, Place, and Person)  Thought Content: WDL   Suicidal Thoughts:  No  Homicidal Thoughts:  No  Memory:  Immediate;   Good Recent;   Good Remote;   Good  Judgement:  Good  Insight:  Good  Psychomotor Activity:  Tremor  Concentration:  Concentration: Good and Attention Span: Good  Recall:  Good  Fund of Knowledge: Good  Language: Good  Akathisia:  No  Handed:  Right  AIMS (if indicated): not done  Assets:  Communication Skills Desire for Improvement Resilience Social Support Talents/Skills  ADL's:  Intact  Cognition: WNL  Sleep:  Good   Screenings: PHQ2-9    Flowsheet Row Video Visit from 12/22/2021 in [place] Video Visit from 10/19/2021 in [place] Video Visit from 09/15/2021 in [place] Video Visit from 08/12/2021 in [facility] Video Visit from 07/15/2021 in [place] [facility]  PHQ-2 Total Score 1 0 0 2 3  PHQ-9 Total Score -- -- -- 7 12      Flowsheet Row Video Visit from 12/22/2021 in [facility] [facility] Video Visit from 10/19/2021 in [place] Video Visit from 09/15/2021 in [place] No Risk No Risk No Risk        Assessment and Plan: This patient is a 51 year old male with a history of bipolar disorder and anxiety.  He is doing well on his current regimen.  He will continue Prozac 20 mg daily for depression, Lamictal 200 mg daily and lurasidone 120 mg daily for mood stabilization and Xanax 2 mg 4 times daily for anxiety.  He also has Wellbutrin XL 150 mg daily for sexual dysfunction.  He will return to see me in 3 months  Collaboration of Care: Collaboration of Care: Referral or follow-up with counselor/therapist AEB we will continue therapy with Maurice Small in our office  Patient/Guardian was advised Release of Information must be obtained prior to any record release in order to collaborate their care with an outside provider. Patient/Guardian was advised if they have not already done so to contact the registration department to sign all necessary forms in order for [place] to release information regarding their care.   Consent: Patient/Guardian gives verbal consent for treatment and assignment of benefits for services provided during this visit. Patient/Guardian expressed understanding and agreed to proceed.    [name], MD 12/22/2021, 1:14  PM

## 2021-12-30 ENCOUNTER — Ambulatory Visit (INDEPENDENT_AMBULATORY_CARE_PROVIDER_SITE_OTHER): Payer: Medicare Other | Admitting: Psychiatry

## 2021-12-30 DIAGNOSIS — F3130 Bipolar disorder, current episode depressed, mild or moderate severity, unspecified: Secondary | ICD-10-CM

## 2021-12-30 DIAGNOSIS — F411 Generalized anxiety disorder: Secondary | ICD-10-CM | POA: Diagnosis not present
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
## 2022-01-16 ENCOUNTER — Emergency Department (HOSPITAL_COMMUNITY)
Admission: EM | Admit: 2022-01-16 | Discharge: 2022-01-16 | Disposition: A | Discharge status: Home / Self Care | Payer: Medicare Other | Attending: Emergency Medicine | Admitting: Emergency Medicine

## 2022-01-16 ENCOUNTER — Emergency Department (HOSPITAL_COMMUNITY): Payer: Medicare Other

## 2022-01-16 ENCOUNTER — Encounter (HOSPITAL_COMMUNITY): Payer: Self-pay | Admitting: Emergency Medicine

## 2022-01-16 DIAGNOSIS — S060X0A Concussion without loss of consciousness, initial encounter: Secondary | ICD-10-CM | POA: Diagnosis not present

## 2022-01-16 DIAGNOSIS — R4182 Altered mental status, unspecified: Secondary | ICD-10-CM | POA: Diagnosis not present

## 2022-01-16 DIAGNOSIS — E876 Hypokalemia: Secondary | ICD-10-CM | POA: Insufficient documentation

## 2022-01-16 DIAGNOSIS — W1839XA Other fall on same level, initial encounter: Secondary | ICD-10-CM | POA: Insufficient documentation

## 2022-01-16 DIAGNOSIS — S0990XA Unspecified injury of head, initial encounter: Secondary | ICD-10-CM | POA: Diagnosis present

## 2022-01-16 DIAGNOSIS — S161XXA Strain of muscle, fascia and tendon at neck level, initial encounter: Secondary | ICD-10-CM | POA: Insufficient documentation

## 2022-01-16 DIAGNOSIS — Y92009 Unspecified place in unspecified non-institutional (private) residence as the place of occurrence of the external cause: Secondary | ICD-10-CM | POA: Insufficient documentation

## 2022-01-16 DIAGNOSIS — F1910 Other psychoactive substance abuse, uncomplicated: Secondary | ICD-10-CM | POA: Insufficient documentation

## 2022-01-16 LAB — BASIC METABOLIC PANEL
Anion gap: 8 (ref 5–15)
BUN: 12 mg/dL (ref 6–20)
CO2: 26 mmol/L (ref 22–32)
Calcium: 8.8 mg/dL — ABNORMAL LOW (ref 8.9–10.3)
Chloride: 103 mmol/L (ref 98–111)
Creatinine, Ser: 1.23 mg/dL (ref 0.61–1.24)
GFR, Estimated: >60 mL/min (ref >60)
Glucose, Bld: 109 mg/dL — ABNORMAL HIGH (ref 70–99)
Potassium: 3 mmol/L — ABNORMAL LOW (ref 3.5–5.1)
Sodium: 137 mmol/L (ref 135–145)

## 2022-01-16 LAB — CBC
HCT: 38.9 % — ABNORMAL LOW (ref 39.0–52.0)
Hemoglobin: 13.6 g/dL (ref 13.0–17.0)
MCH: 33.7 pg (ref 26.0–34.0)
MCHC: 35 g/dL (ref 30.0–36.0)
MCV: 96.5 fL (ref 80.0–100.0)
Platelets: 255 10*3/uL (ref 150–400)
RBC: 4.03 MIL/uL — ABNORMAL LOW (ref 4.22–5.81)
RDW: 13.6 % (ref 11.5–15.5)
WBC: 8.2 10*3/uL (ref 4.0–10.5)
nRBC: 0 % (ref 0.0–0.2)

## 2022-01-16 LAB — ETHANOL: Alcohol, Ethyl (B): 10 mg/dL — ABNORMAL HIGH (ref <10)

## 2022-01-16 MED ORDER — POTASSIUM CHLORIDE CRYS ER 20 MEQ PO TBCR
40.0000 meq | EXTENDED_RELEASE_TABLET | Freq: Once | ORAL | Status: AC
  Administered 2022-01-16: 40 meq via ORAL
  Filled 2022-01-16: qty 2
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
## 2022-01-16 NOTE — ED Notes (Addendum)
Blood cleaned from patient's forehead at this time and patient has knot to left forehead. Patient alert & oriented X3 and able to ambulate approximately 15 feet.

## 2022-01-16 NOTE — ED Notes (Signed)
Patient's mom, emily, states she would be here to gwt patient for discharge. Patient verbalized discharge instructions.

## 2022-01-16 NOTE — ED Provider Notes (Signed)
[facility] EMERGENCY DEPARTMENT Provider Note   CSN: [id number] Arrival date & time: 01/16/22  9381     History  Chief Complaint  Patient presents with   Fall  Level 5 caveat due to altered mental status  Roger Duran is a 51 y.o. male.  The history is provided by the patient and the EMS personnel.   Patient with history of bipolar and tremors presents after a fall.  Patient admits to drinking alcohol and "smoking dope" EMS was called to the home by his roommate after they was found in the floor.  Patient was obviously intoxicated, reporting a dry mouth and said his ankles hurt.  Patient is intoxicated which makes history very difficult   Past Medical History:  Diagnosis Date   Anxiety    Bipolar 1 disorder ([place])    Depression    Mania ([place])    Psoriasis     Home Medications Prior to Admission medications   Medication Sig Start Date End Date Taking? Authorizing Provider  acetaminophen (TYLENOL) 500 MG tablet Take by mouth.    [provider]  acyclovir (ZOVIRAX) 400 MG tablet TAKE (1) TABLET THREE TIMES DAILY AS NEEDED. 04/08/21   Lavonna Monarch, MD  Adalimumab (HUMIRA PEN) 40 MG/0.4ML PNKT INJECT '40MG'$  SUBCUTANEOUSLY EVERY 2 WEEKS AS DIRECTED. 12/01/21   Sheffield, Ronalee Red, PA-C  alprazolam Duanne Moron) 2 MG tablet Take 1 tablet (2 mg total) by mouth in the morning, at noon, in the evening, and at bedtime. 12/22/21   Cloria Spring, MD  baclofen (LIORESAL) 10 MG tablet Take 10 mg by mouth 2 (two) times daily as needed. 04/03/19   [provider]  buPROPion (WELLBUTRIN XL) 150 MG 24 hr tablet Take 1 tablet (150 mg total) by mouth every morning. 12/22/21 12/22/22  Cloria Spring, MD  clobetasol (TEMOVATE) 0.05 % external solution Apply 1 application topically 2 (two) times daily. 07/14/20   Sheffield, Ronalee Red, PA-C  cycloSPORINE (RESTASIS) 0.05 % ophthalmic emulsion Place 1 drop into both eyes 2 (two) times daily.    [provider]  EPINEPHrine 0.3  mg/0.3 mL IJ SOAJ injection Inject into the muscle. 11/15/19   [provider]  FLUoxetine (PROZAC) 20 MG capsule Take 1 capsule (20 mg total) by mouth daily. 12/22/21 12/22/22  Cloria Spring, MD  gabapentin (NEURONTIN) 600 MG tablet Take 600 mg by mouth 4 (four) times daily. 06/20/20   [provider]  lamoTRIgine (LAMICTAL) 200 MG tablet TAKE  (1)  TABLET TWICE A DAY. 12/22/21   Cloria Spring, MD  Lurasidone HCl (LATUDA) 120 MG TABS Take 1 tablet (120 mg total) by mouth daily. 12/22/21   Cloria Spring, MD  Multiple Vitamins-Minerals (MENS 50+ MULTI VITAMIN/MIN) TABS Take 1 tablet by mouth every morning.    [provider]  Omega-3 Fatty Acids (COROMEGA OMEGA 3 SQUEEZE) EMUL Take by mouth.    [provider]  omeprazole (PRILOSEC) 20 MG capsule Take 20 mg by mouth daily.    [provider]  Potassium Gluconate 2.5 MEQ TABS Take by mouth.    [provider]  tadalafil (CIALIS) 5 MG tablet Take 5 mg by mouth daily. 09/14/21   [provider]  VEMLIDY 25 MG TABS Take 1 tablet by mouth daily. 06/20/20   [provider]      Allergies    Apple juice, Bee venom, Morphine, Aspirin, Sulfa antibiotics, Dicyclomine, Other, and Penicillins    Review of Systems  Review of Systems  Unable to perform ROS: Mental status change    Physical Exam Updated Vital Signs BP 115/77   Pulse 78   Temp 97.9 F (36.6 C)   Resp 13   Ht 1.676 m ('5\' 6"'$ )   Wt 68 kg   SpO2 90%   BMI 24.20 kg/m  Physical Exam CONSTITUTIONAL: Disheveled, intoxicated HEAD: Small abrasion to left forehead, no other signs of trauma EYES: EOMI/PERRL ENMT: Mucous membranes dry, no visible trauma NECK: supple no meningeal signs SPINE/BACK:entire spine nontender No bruising/crepitance/stepoffs noted to spine CV: S1/S2 noted LUNGS: Lungs are clear to auscultation bilaterally, no apparent distress ABDOMEN: soft, nontender, no bruising GU:no cva  tenderness NEURO: Pt is awake/alert but he is confused, moves all extremitiesx4.  No facial droop.  Significant tremor is noted throughout his extremities EXTREMITIES: pulses normal/equal, full ROM All other extremities/joints palpated/ranged and nontender No tenderness noted to either ankle SKIN: warm, color normal   ED Results / Procedures / Treatments   Labs (all labs ordered are listed, but only abnormal results are displayed) Labs Reviewed  BASIC METABOLIC PANEL - Abnormal; Notable for the following components:      Result Value   Potassium 3.0 (*)    Glucose, Bld 109 (*)    Calcium 8.8 (*)    All other components within normal limits  CBC - Abnormal; Notable for the following components:   RBC 4.03 (*)    HCT 38.9 (*)    All other components within normal limits  ETHANOL - Abnormal; Notable for the following components:   Alcohol, Ethyl (B) 10 (*)    All other components within normal limits  RAPID URINE DRUG SCREEN, HOSP PERFORMED    EKG EKG Interpretation  Date/Time:  Sunday January 16 2022 05:32:03 EDT Ventricular Rate:  87 PR Interval:  158 QRS Duration: 240 QT Interval:  334 QTC Calculation: 402 R Axis:   32 Text Interpretation: Sinus or ectopic atrial rhythm Nonspecific intraventricular conduction delay Interpretation limited secondary to artifact Confirmed by Ripley Fraise ([id number]) on 01/16/2022 5:38:21 AM  Radiology CT Cervical Spine Wo Contrast  Result Date: 01/16/2022 CLINICAL DATA:  Alcohol intoxication. Patient was found on the floor. EXAM: CT CERVICAL SPINE WITHOUT CONTRAST TECHNIQUE: Multidetector CT imaging of the cervical spine was performed without intravenous contrast. Multiplanar CT image reconstructions were also generated. RADIATION DOSE REDUCTION: This exam was performed according to the departmental dose-optimization program which includes automated exposure control, adjustment of the mA and/or kV according to patient size and/or use of  iterative reconstruction technique. COMPARISON:  Report of CT cervical spine 12/11/2015 at [facility]. Images are not currently available. FINDINGS: Alignment: No significant listhesis is present. Straightening and slight reversal of the normal cervical lordosis is present. Skull base and vertebrae: Craniocervical junction is within normal limits. Vertebral body heights are normal. No acute or healing fractures are present. Soft tissues and spinal canal: No prevertebral fluid or swelling. No visible canal hematoma. Disc levels: Uncovertebral and foraminal stenosis is greatest at C5-6, left greater than right. Moderate left foraminal stenosis is also present at C6-7. Asymmetric right-sided uncovertebral disease is noted at C2-3. Upper chest: The lung apices are clear. Thoracic inlet is within normal limits. IMPRESSION: 1. No acute fracture or traumatic subluxation. 2. Multilevel degenerative changes of the cervical spine as described. Electronically Signed   By: San Morelle M.D.   On: 01/16/2022 07:08   CT Head Wo Contrast  Result Date: 01/16/2022 CLINICAL DATA:  Fall. Alcohol intoxication.  Patient was found on floor by remained. EXAM: CT HEAD WITHOUT CONTRAST TECHNIQUE: Contiguous axial images were obtained from the base of the skull through the vertex without intravenous contrast. RADIATION DOSE REDUCTION: This exam was performed according to the departmental dose-optimization program which includes automated exposure control, adjustment of the mA and/or kV according to patient size and/or use of iterative reconstruction technique. COMPARISON:  Report of CT head without contrast 05/28/15 FINDINGS: Brain: Bilateral deep brain stimulators are in place. Leads terminate at the interface of the thalami and cerebral peduncles bilaterally. No acute infarct, hemorrhage, or mass lesion is present. The ventricles are of normal size. No significant white matter lesions are present. No significant extraaxial  fluid collection is present. The brainstem and cerebellum are within normal limits. Vascular: No hyperdense vessel or unexpected calcification. Skull: Bilateral frontal burr holes are associated with the deep brain stimulators. Calvarium is otherwise unremarkable. No significant extracranial soft tissue lesion is present. Sinuses/Orbits: The paranasal sinuses and mastoid air cells are clear. The globes and orbits are within normal limits. IMPRESSION: 1. No acute intracranial abnormality. 2. Bilateral deep brain stimulators in place. Electronically Signed   By: San Morelle M.D.   On: 01/16/2022 07:03   DG Chest Port 1 View  Result Date: 01/16/2022 CLINICAL DATA:  51 year old male status post fall. Found down. Pain. EXAM: PORTABLE CHEST 1 VIEW COMPARISON:  Portable chest 09/19/2003. FINDINGS: Portable AP semi upright view at 0550 hours. Left chest generator device with leads ascending into the left neck. Lower lung volumes compared to 2005. Mediastinal contours remain normal. Visualized tracheal air column is within normal limits. Allowing for portable technique the lungs are clear. No pneumothorax or pleural effusion. No acute osseous abnormality identified. Negative visible bowel gas. IMPRESSION: No acute cardiopulmonary abnormality or acute traumatic injury identified. Electronically Signed   By: Genevie Ann M.D.   On: 01/16/2022 06:42    Procedures Procedures    Medications Ordered in ED Medications  potassium chloride SA (KLOR-CON M) CR tablet 40 mEq (has no administration in time range)    ED Course/ Medical Decision Making/ A&P Clinical Course as of 01/16/22 0731  [facility] Jan 16, 2022  0542 Patient presents after falling due to intoxication.  His speech is very slurred.  Patient has significant tremor at baseline and has a deep brain stimulator in place.  Due to the DBS, artifact noted on EKG  [DW]  0719 Potassium(!): 3.0 Mild hypokalemia [DW]  0731 Patient reports feeling improved.  He  is ambulatory.  No new complaints.  No chest pain, no abdominal pain.  No signs of any extremity trauma.  His tremor appears to be improved.  His mental status is improved.  Patient is safe for discharge home. [DW]    Clinical Course User Index [DW] Ripley Fraise, MD         Glasgow Coma Scale Score: 14      NEXUS Criteria Score: 1            Medical Decision Making Amount and/or Complexity of Data Reviewed Labs: ordered. Decision-making details documented in ED Course. Radiology: ordered.  Risk Prescription drug management.   This patient presents to the ED for concern of fall and injury, this involves an extensive number of treatment options, and is a complaint that carries with it a high risk of complications and morbidity.  The differential diagnosis includes but is not limited to intracranial hemorrhage, subdural hematoma, skull fracture, cervical spine fracture  Comorbidities that complicate the  patient evaluation: Patient's presentation is complicated by their history of tremors and bipolar  Social Determinants of Health: Patient's  substance use disorder   increases the complexity of managing their presentation  Additional history obtained: Additional history obtained from EMS  Records reviewed Care Everywhere/External Records  Lab Tests: I Ordered, and personally interpreted labs.  The pertinent results include: Hypokalemia  Imaging Studies ordered: I ordered imaging studies including CT head and C-spine and chest x-ray I independently visualized and interpreted imaging which showed no acute findings I agree with the radiologist interpretation  Cardiac Monitoring: The patient was maintained on a cardiac monitor.  I personally viewed and interpreted the cardiac monitor which showed an underlying rhythm of:  sinus rhythm  Reevaluation: After the interventions noted above, I reevaluated the patient and found that they have :improved  Complexity of problems  addressed: Patient's presentation is most consistent with  acute presentation with potential threat to life or bodily function  Disposition: After consideration of the diagnostic results and the patient's response to treatment,  I feel that the patent would benefit from discharge   .           Final Clinical Impression(s) / ED Diagnoses Final diagnoses:  Concussion without loss of consciousness, initial encounter  Strain of neck muscle, initial encounter  Polysubstance abuse [facility])    Rx / DC Orders ED Discharge Orders     None         Ripley Fraise, MD 01/16/22 [phone]

## 2022-01-20 ENCOUNTER — Ambulatory Visit (INDEPENDENT_AMBULATORY_CARE_PROVIDER_SITE_OTHER): Payer: Medicare Other | Admitting: Psychiatry

## 2022-01-20 DIAGNOSIS — F3130 Bipolar disorder, current episode depressed, mild or moderate severity, unspecified: Secondary | ICD-10-CM
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
## 2022-02-03 ENCOUNTER — Ambulatory Visit (HOSPITAL_COMMUNITY): Payer: Medicare Other | Admitting: Psychiatry

## 2022-02-17 ENCOUNTER — Ambulatory Visit (INDEPENDENT_AMBULATORY_CARE_PROVIDER_SITE_OTHER): Payer: Medicare Other | Admitting: Psychiatry

## 2022-02-17 DIAGNOSIS — F3130 Bipolar disorder, current episode depressed, mild or moderate severity, unspecified: Secondary | ICD-10-CM

## 2022-02-17 DIAGNOSIS — F411 Generalized anxiety disorder: Secondary | ICD-10-CM | POA: Diagnosis not present

## 2022-02-17 NOTE — Progress Notes (Signed)
Virtual Visit via Video Note  I connected with Roger Duran on 02/17/22 at 2:12 PM EDT  by a video enabled telemedicine application and verified that I am speaking with the correct person using two identifiers.  Location: Patient: Home Provider: [place] office    I discussed the limitations of evaluation and management by telemedicine and the availability of in person appointments. The patient expressed understanding and agreed to proceed.   I provided 36 minutes of non-face-to-face time during this encounter.   [name], LCSW      THERAPIST PROGRESS NOTE     Session Time: Thursday 02/17/2022 2:12 PM - 2:48 PM    Participation Level: Active     Behavioral Response: Casual/ Alert/  Type of Therapy: Individual Therapy  Treatment Goals addressed: Patient will practice behavioral activation skills 2-3 times per week for the next 12 weeks  Progress on goals: Progressing  Interventions: CBT and Supportive  Summary: Roger Duran is a 51 y.o. male who  is referrred for services by psychiatrist Dr. Harrington Challenger for continuity of care. Patient has history of bipolar disorder and was seeing S. Schneidmiller for therapy. She now is out on medical leave. He reports one psychiatric hospitalization due to suicide attempt in 2009. He received outpatient treatment at [facility]. He eventually saw Ms. Schenidmiller and psychiatrist Dr. Harrington Challenger.  Patient reports he tends to have depressive and missed episodes.  He also reports constant anxiety along with paranoia and delusional thinking at times.  He reports being depressed most of the time and having to force himself out of bed.  Patient last was seen via virtual visit about 4- 6 weeks ago.  He reports being down today regarding his mood and also reports not feeling well physically.  He reports continued frustration as his physical activity has been limited due to foot problems that developed after twisting his ankle in June.   He continues to experience foot pain and this also affects his ability to drive.  He is scheduled for an MRI on 10/26 and a follow-up appointment with his doctor on 11/7.  He reports trying to maintain a daily routine of doing light household tasks but reports still wanting to be able to do more social activities and get out more.  He reports less stress regarding finances as roommate situation is going very well and this generates another source of income.  He also reports less stress and worry about brother and sister-in-law and expresses acceptance of their situation.  He is pleased about recent contact with his sister-in-law.  He continues to express frustration about his father's behavior but has improved his ability to pause and disengage to avoid escalating arguments with his father.     Suicidal/Homicidal: No, without intent/plan   Therapist Response: reviewed symptoms, discussed stressors, facilitated expression of thoughts and feelings, validated feelings, assisted patient try to identify triggers of down mood, praised and reinforced patient's efforts to try to maintain daily routine, praised and reinforced patient's increased efforts to take a pause and disengage to avoid escalating arguments with father, reviewed treatment plan, obtained patient's permission to electronically sign treatment plan review as this was a virtual visit   Plan: Return in 2 weeks  Diagnosis: Axis I: Bipolar, Depressed    Axis II: No diagnosis  Collaboration of Care: Psychiatrist AEB patient working with psychiatrist Dr. Harrington Challenger, clinician reviewing chart.  Patient/Guardian was advised Release of Information must be obtained prior to any record release in order to  collaborate their care with an outside provider. Patient/Guardian was advised if they have not already done so to contact the registration department to sign all necessary forms in order for [place] to release information regarding their care.   Consent:  Patient/Guardian gives verbal consent for treatment and assignment of benefits for services provided during this visit. Patient/Guardian expressed understanding and agreed to proceed.   [name], LCSW 02/17/2022

## 2022-02-17 NOTE — Plan of Care (Signed)
  Problem: Depression CCP  depressed mood, worry, negative thoughts, sad, lonely Goal: LTG: Reduce frequency, intensity, and duration of depression symptoms as evidenced by pt engaging in activities (going to the gym, contacting friends, recreational activities ) 3 x per week consistently for 3 weeks Outcome: Progressing Goal: STG: Lacinda Axon" WILL IDENTIFY 3 COGNITIVE PATTERNS AND BELIEFS THAT SUPPORT DEPRESSION Outcome: Progressing Goal: STG: Lacinda Axon" WILL PRACTICE BEHAVIORAL ACTIVATION SKILLS 2-3 TIMES PER WEEK FOR THE NEXT 12 WEEKS Outcome: Progressing

## 2022-03-03 ENCOUNTER — Ambulatory Visit (HOSPITAL_COMMUNITY): Payer: Medicare Other | Admitting: Psychiatry

## 2022-03-18 ENCOUNTER — Ambulatory Visit (INDEPENDENT_AMBULATORY_CARE_PROVIDER_SITE_OTHER): Payer: Medicare Other | Admitting: Psychiatry

## 2022-03-18 DIAGNOSIS — F411 Generalized anxiety disorder: Secondary | ICD-10-CM | POA: Diagnosis not present

## 2022-03-18 DIAGNOSIS — F3130 Bipolar disorder, current episode depressed, mild or moderate severity, unspecified: Secondary | ICD-10-CM | POA: Diagnosis not present

## 2022-03-18 NOTE — Progress Notes (Signed)
Virtual Visit via Video Note  I connected with [name] on 03/18/22 at 11:05 AM EST  by a video enabled telemedicine application and verified that I am speaking with the correct person using two identifiers.  Location: Patient: Home Provider: [place] office    I discussed the limitations of evaluation and management by telemedicine and the availability of in person appointments. The patient expressed understanding and agreed to proceed.   I provided 45 minutes of non-face-to-face time during this encounter.   [name], LCSW      THERAPIST PROGRESS NOTE     Session Time: Friday  03/18/2022 11:05 AM - 11:50 AM    Participation Level: Active     Behavioral Response: Casual/ Alert/Depressed   Type of Therapy: Individual Therapy  Treatment Goals addressed: Patient will practice behavioral activation skills 2-3 times per week for the next 12 weeksoseph "Anthony" WILL IDENTIFY 3 COGNITIVE PATTERNS AND BELIEFS THAT SUPPORT DEPRESSION  Progress on goals: Progressing  Interventions: CBT and Supportive  Summary: TREMELL REIMERS is a 51 y.o. male who  is referrred for services by psychiatrist Dr. Harrington Challenger for continuity of care. Patient has history of bipolar disorder and was seeing S. Schneidmiller for therapy. She now is out on medical leave. He reports one psychiatric hospitalization due to suicide attempt in 2009. He received outpatient treatment at [facility]. He eventually saw Ms. Schenidmiller and psychiatrist Dr. Harrington Challenger.  Patient reports he tends to have depressive and missed episodes.  He also reports constant anxiety along with paranoia and delusional thinking at times.  He reports being depressed most of the time and having to force himself out of bed.  Patient last was seen via virtual visit about 4- 6 weeks ago.  He reports experiencing increased depressed mood for about 4 days states isolating himself and not going anywhere.  He reports finally forcing  himself to visit and says this helped.  He also reports having 1 day where he experienced manic-like symptoms including talking excessively, goal oriented behavior like is excessive cleaning, and calling people on the phone.  He reports trying to use deep breathing and talking to his mother as ways to cope.  Patient also reports continuing depressed body scan meditation to try to relax and says this has been very helpful.  He reports his MRI and follow-up doctors appointment has been pushed back to December.  He can still walk but says his both his feet hurt.  He fears his foot may not be healing properly.  He reports additional stress regarding finances as he has to find a new insurance carrier.  However he has used positive problem-solving skills and has found another carrier.  He continues to express frustration regarding his father's behavior toward his mother especially when drinking.  Father continues to drink heavily each night.  Patient expresses sadness and frustration regarding the effects of his mother.  However, he has continued to avoid escalating arguments with father.  Patient is looking forward to celebrating Thanksgiving with his family and neighbors. Suicidal/Homicidal: No, without intent/plan   Therapist Response: reviewed symptoms, assisted patient examine mood swings, assisted patient try to identify possible triggers, praised and reinforced patient's efforts to use healthy coping strategies when experiencing mood swings, discussed effects, discussed stressors, facilitated expression of thoughts and feelings, validated feelings, praised and reinforced patient's use of healthy problem-solving skills regarding insurance, discussed effects, assisted patient identify realistic expectations of self regarding situation with his parents, discussed possibility of patient  and his mother attending Al-Anon, develop plan with patient to continue efforts to improve daily routine and structure using  behavioral activation skills, also developed plan with patient to research Al-Anon groups in the area, also obtain patient's permission to electronically sign addendum to treatment plan. Plan: Return in 2 weeks  Diagnosis: Axis I: Bipolar, Depressed    Axis II: No diagnosis  Collaboration of Care: Psychiatrist AEB patient working with psychiatrist Dr. Harrington Challenger, clinician reviewing chart.  Patient/Guardian was advised Release of Information must be obtained prior to any record release in order to collaborate their care with an outside provider. Patient/Guardian was advised if they have not already done so to contact the registration department to sign all necessary forms in order for [place] to release information regarding their care.   Consent: Patient/Guardian gives verbal consent for treatment and assignment of benefits for services provided during this visit. Patient/Guardian expressed understanding and agreed to proceed.   [name], LCSW 03/18/2022

## 2022-03-21 ENCOUNTER — Telehealth (INDEPENDENT_AMBULATORY_CARE_PROVIDER_SITE_OTHER): Payer: Medicare Other | Admitting: Psychiatry

## 2022-03-21 ENCOUNTER — Encounter (HOSPITAL_COMMUNITY): Payer: Self-pay | Admitting: Psychiatry

## 2022-03-21 DIAGNOSIS — F3130 Bipolar disorder, current episode depressed, mild or moderate severity, unspecified: Secondary | ICD-10-CM | POA: Diagnosis not present

## 2022-03-21 DIAGNOSIS — F411 Generalized anxiety disorder: Secondary | ICD-10-CM | POA: Diagnosis not present

## 2022-03-21 MED ORDER — BUPROPION HCL ER (XL) 150 MG PO TB24: 150.0000 mg | ORAL_TABLET | ORAL | 2 refills | Status: DC

## 2022-03-21 MED ORDER — FLUOXETINE HCL 20 MG PO CAPS: 20.0000 mg | ORAL_CAPSULE | Freq: Every day | ORAL | 2 refills | Status: DC

## 2022-03-21 MED ORDER — LURASIDONE HCL 120 MG PO TABS: 120.0000 mg | ORAL_TABLET | Freq: Every day | ORAL | 2 refills | Status: DC

## 2022-03-21 MED ORDER — LAMOTRIGINE 200 MG PO TABS: ORAL_TABLET | ORAL | 2 refills | Status: DC

## 2022-03-21 MED ORDER — ALPRAZOLAM 2 MG PO TABS: 2.0000 mg | ORAL_TABLET | Freq: Four times a day (QID) | ORAL | 2 refills | Status: DC

## 2022-03-21 NOTE — Progress Notes (Signed)
Virtual Visit via Telephone Note  I connected with Roger Duran on 03/21/22 at  1:20 PM EST by telephone and verified that I am speaking with the correct person using two identifiers.  Location: Patient: home Provider: office   I discussed the limitations, risks, security and privacy concerns of performing an evaluation and management service by telephone and the availability of in person appointments. I also discussed with the patient that there may be a patient responsible charge related to this service. The patient expressed understanding and agreed to proceed.     I discussed the assessment and treatment plan with the patient. The patient was provided an opportunity to ask questions and all were answered. The patient agreed with the plan and demonstrated an understanding of the instructions.   The patient was advised to call back or seek an in-person evaluation if the symptoms worsen or if the condition fails to improve as anticipated.  I provided 15 minutes of non-face-to-face time during this encounter.   [name], MD  [facility] MD/PA/NP OP Progress Note  03/21/2022 1:34 PM Roger Duran  MRN:  [id number]  Chief Complaint:  Chief Complaint  Patient presents with   Depression   Manic Behavior   Anxiety   Follow-up   HPI: This patient is a 51 year old white male who lives with a roommate in [place].  He is on disability for bipolar disorder.  The patient returns for follow-up after 3 months.  He is generally been doing okay although he was seen in the emergency room back in September after he had a fall.  He admits he had been drinking alcohol and smoking marijuana.  He states this was the first time he had drank alcohol in quite some time and he does not plan to repeat it.  Fortunately he did not have any serious injury.  He does admit that he smokes marijuana periodically because "it makes me feel better."  I warned him about the effects of might have in combination  with some of the medications.  Overall his mood has been stable and he denies significant anxiety depression manic symptoms or thoughts of self-harm or suicide.  He states he had "1 day of mania" a few days ago but it subsided quickly.  He is generally sleeping well. Visit Diagnosis:    ICD-10-CM   1. Bipolar I disorder, most recent episode depressed ([place])  F31.30     2. GAD (generalized anxiety disorder)  F41.1       Past Psychiatric History: Hospitalization in 2009 after suicide attempt otherwise outpatient treatment  Past Medical History:  Past Medical History:  Diagnosis Date   Anxiety    Bipolar 1 disorder ([place])    Depression    Mania ([place])    Psoriasis     Past Surgical History:  Procedure Laterality Date   APPENDECTOMY     back procedures     COLONOSCOPY N/A 11/14/2014   Procedure: COLONOSCOPY;  Surgeon: Rogene Houston, MD;  Location: [facility];  Service: Endoscopy;  Laterality: N/A;  11:10 - moved to 8:30 - Ann to notify   TONSILLECTOMY     WRIST SURGERY      Family Psychiatric History: See below  Family History:  Family History  Problem Relation Age of Onset   Hyperlipidemia Mother    Heart disease Father    Anxiety disorder Father    Bipolar disorder Maternal Grandfather     Social History:  Social History  Socioeconomic History   Marital status: Single    Spouse name: Not on file   Number of children: Not on file   Years of education: Not on file   Highest education level: Not on file  Occupational History   Not on file  Tobacco Use   Smoking status: Former    Types: Cigarettes, E-cigarettes    Quit date: 11/21/2019    Years since quitting: 2.3   Smokeless tobacco: Former  Scientific laboratory technician Use: Never used  Substance and Sexual Activity   Alcohol use: No    Alcohol/week: 0.0 standard drinks of alcohol    Comment: rarely   Drug use: No    Comment: 1995-2000 PER PT HE DID A LOT OF COCAINE AND PILLS AND DRANK A LOT   Sexual activity:  Not Currently    Partners: Male    Birth control/protection: Condom  Other Topics Concern   Not on file  Social History Narrative   Not on file   Social Determinants of Health   Financial Resource Strain: Not on file  Food Insecurity: Not on file  Transportation Needs: Not on file  Physical Activity: Not on file  Stress: Not on file  Social Connections: Not on file    Allergies:  Allergies  Allergen Reactions   Apple Juice Itching and Swelling    Throat and mouth itching and swelling Throat and mouth itching and swelling   Bee Venom Anaphylaxis and Other (See Comments)    Weak, shaky, dizzy. Weak, shaky, dizzy.   Morphine Palpitations   Aspirin Nausea And Vomiting   Sulfa Antibiotics Nausea And Vomiting   Dicyclomine Other (See Comments)    Aka Dicyclomine Aka Dicyclomine   Other Other (See Comments)    Aka Dicyclomine   Penicillins Itching and Rash    Metabolic Disorder Labs: No results found for: "HGBA1C", "MPG" No results found for: "PROLACTIN" No results found for: "CHOL", "TRIG", "HDL", "CHOLHDL", "VLDL", "LDLCALC" No results found for: "TSH"  Therapeutic Level Labs: Lab Results  Component Value Date   LITHIUM 1.10 10/15/2014   No results found for: "VALPROATE" No results found for: "CBMZ"  Current Medications: Current Outpatient Medications  Medication Sig Dispense Refill   acetaminophen (TYLENOL) 500 MG tablet Take by mouth.     acyclovir (ZOVIRAX) 400 MG tablet TAKE (1) TABLET THREE TIMES DAILY AS NEEDED. 60 tablet 10   Adalimumab (HUMIRA PEN) 40 MG/0.4ML PNKT INJECT '40MG'$  SUBCUTANEOUSLY EVERY 2 WEEKS AS DIRECTED. 2 each 8   alprazolam (XANAX) 2 MG tablet Take 1 tablet (2 mg total) by mouth in the morning, at noon, in the evening, and at bedtime. 120 tablet 2   baclofen (LIORESAL) 10 MG tablet Take 10 mg by mouth 2 (two) times daily as needed.     buPROPion (WELLBUTRIN XL) 150 MG 24 hr tablet Take 1 tablet (150 mg total) by mouth every morning. 30  tablet 2   clobetasol (TEMOVATE) 0.05 % external solution Apply 1 application topically 2 (two) times daily. 50 mL 9   cycloSPORINE (RESTASIS) 0.05 % ophthalmic emulsion Place 1 drop into both eyes 2 (two) times daily.     EPINEPHrine 0.3 mg/0.3 mL IJ SOAJ injection Inject into the muscle.     FLUoxetine (PROZAC) 20 MG capsule Take 1 capsule (20 mg total) by mouth daily. 30 capsule 2   gabapentin (NEURONTIN) 600 MG tablet Take 600 mg by mouth 4 (four) times daily.     lamoTRIgine (LAMICTAL) 200 MG  tablet TAKE  (1)  TABLET TWICE A DAY. 60 tablet 2   Lurasidone HCl (LATUDA) 120 MG TABS Take 1 tablet (120 mg total) by mouth daily. 90 tablet 2   Multiple Vitamins-Minerals (MENS 50+ MULTI VITAMIN/MIN) TABS Take 1 tablet by mouth every morning.     Omega-3 Fatty Acids (COROMEGA OMEGA 3 SQUEEZE) EMUL Take by mouth.     omeprazole (PRILOSEC) 20 MG capsule Take 20 mg by mouth daily.     Potassium Gluconate 2.5 MEQ TABS Take by mouth.     tadalafil (CIALIS) 5 MG tablet Take 5 mg by mouth daily.     VEMLIDY 25 MG TABS Take 1 tablet by mouth daily.     No current facility-administered medications for this visit.     Musculoskeletal: Strength & Muscle Tone: na Gait & Station: na Patient leans: N/A  Psychiatric Specialty Exam: Review of Systems  Musculoskeletal:  Positive for arthralgias.  Neurological:  Positive for tremors.  All other systems reviewed and are negative.   There were no vitals taken for this visit.There is no height or weight on file to calculate BMI.  General Appearance: NA  Eye Contact:  NA  Speech:  Clear and Coherent  Volume:  Normal  Mood:  Euthymic  Affect:  NA  Thought Process:  Goal Directed  Orientation:  Full (Time, Place, and Person)  Thought Content: WDL   Suicidal Thoughts:  No  Homicidal Thoughts:  No  Memory:  Immediate;   Good Recent;   Good Remote;   Good  Judgement:  Good  Insight:  Fair  Psychomotor Activity:  Tremor  Concentration:   Concentration: Good and Attention Span: Good  Recall:  Good  Fund of Knowledge: Good  Language: Good  Akathisia:  No  Handed:  Right  AIMS (if indicated): not done  Assets:  Communication Skills Desire for Improvement Resilience Social Support  ADL's:  Intact  Cognition: WNL  Sleep:  Good   Screenings: PHQ2-9    Flowsheet Row Counselor from 01/20/2022 in [place] [facility] Video Visit from 12/22/2021 in [place] [place] [facility] Video Visit from 10/19/2021 in [place] [facility] Video Visit from 09/15/2021 in [place] [place] [facility] Video Visit from 08/12/2021 in [place] [facility] [facility]  PHQ-2 Total Score 2 1 0 0 2  PHQ-9 Total Score 11 -- -- -- 7      Flowsheet Row Counselor from 01/20/2022 in [facility] from 01/16/2022 in [place] Video Visit from 12/22/2021 in [place] Risk No Risk No Risk        Assessment and Plan: This patient is a 51 year old male with a history of bipolar disorder and anxiety.  For the most part he is doing well on his current regimen.  He will continue Prozac 20 mg daily for depression, Lamictal 200 mg daily and lurasidone 120 mg daily for mood stabilization Xanax to milligram 4 times daily for anxiety and Wellbutrin XL 150 mg daily for sexual dysfunction.  He will return to see me in 3 months  Collaboration of Care: Collaboration of Care: Referral or follow-up with counselor/therapist AEB patient will continue therapy with Maurice Small in our office  Patient/Guardian was advised Release of Information must be obtained prior to any record release in order to collaborate their care with an outside provider. Patient/Guardian was advised if they have not already done so  to contact the registration department to sign all necessary forms in order for [place] to release information regarding their care.   Consent: Patient/Guardian gives verbal consent for treatment and assignment of benefits for services provided during this visit. Patient/Guardian expressed understanding and agreed to proceed.    [name], MD 03/21/2022, 1:34 PM

## 2022-03-31 ENCOUNTER — Ambulatory Visit (INDEPENDENT_AMBULATORY_CARE_PROVIDER_SITE_OTHER): Payer: Medicare Other | Admitting: Psychiatry

## 2022-03-31 DIAGNOSIS — F411 Generalized anxiety disorder: Secondary | ICD-10-CM

## 2022-03-31 DIAGNOSIS — F3130 Bipolar disorder, current episode depressed, mild or moderate severity, unspecified: Secondary | ICD-10-CM | POA: Diagnosis not present

## 2022-03-31 NOTE — Progress Notes (Signed)
Virtual Visit via Video Note  I connected with Roger Duran on 03/31/22 at 3:08 PM EST by a video enabled telemedicine application and verified that I am speaking with the correct person using two identifiers.  Location: Patient: Home Provider: [place] office    I discussed the limitations of evaluation and management by telemedicine and the availability of in person appointments. The patient expressed understanding and agreed to proceed.  I provided 45 minutes of non-face-to-face time during this encounter.   Roger Smoker, LCSW      THERAPIST PROGRESS NOTE     Session Time: Friday  03/31/2022 3:07 PM - 3:52 PM    Participation Level: Active     Behavioral Response: Casual/ Alert/Depressed   Type of Therapy: Individual Therapy  Treatment Goals addressed: Patient will practice behavioral activation skills 2-3 times per week for the next 12 weeksoseph "Anthony" WILL IDENTIFY 3 COGNITIVE PATTERNS AND BELIEFS THAT SUPPORT DEPRESSION  Progress on goals: Progressing  Interventions: CBT and Supportive  Summary: Roger Duran is a 51 y.o. male who  is referrred for services by psychiatrist Dr. Harrington Challenger for continuity of care. Patient has history of bipolar disorder and was seeing S. Schneidmiller for therapy. She now is out on medical leave. He reports one psychiatric hospitalization due to suicide attempt in 2009. He received outpatient treatment at [facility]. He eventually saw Ms. Schenidmiller and psychiatrist Dr. Harrington Challenger.  Patient reports he tends to have depressive and missed episodes.  He also reports constant anxiety along with paranoia and delusional thinking at times.  He reports being depressed most of the time and having to force himself out of bed.  Patient last was seen via virtual visit about 2-3 weeks ago.  He reports mainly being in a depressed mood since last session.  He did enjoy celebrating Thanksgiving with his family and neighbors.  However he  states being mainly down and attributes some of this to poor sleep patterns.  He has disrupted sleep as he provides transportation to his roommate to and from work.  His roommate works second shift.  He also reports continued financial stress as he has found another affordable insurance carrier but will not have much money left over at the end of the month after he pays his bills.  He also expresses continued frustration regarding issues with his foot.  He still is planning to have an MRI and see his doctor in December.  He reports increased thoughts about his grandmother triggered by the holidays.  He also reports grief and loss issues related to no longer having contact with his niece due to her parents marital separation.  He worries about the effect of this on his parents as well as they miss seeing their grandchild.  He forgot to explore resources regarding Al-Anon but plans to talk with his mother about it tonight.  Suicidal/Homicidal: No, without intent/plan   Therapist Response: reviewed symptoms, discussed stressors, facilitated expression of thoughts and feelings, validated feelings, assisted patient explore ways to improve sleep patterns, discussed the role of sleep in managing bipolar disorder, assisted patient examine connection between his thoughts/mood/behavior using examples from his life, assisted patient began to identify/challenge/and replace negative thoughts about his limited budget was more helpful thoughts, facilitated patient expressing thoughts and feelings about grief and loss issues related to grandmother as well and has loss of connection with his knees, normalized and validated feelings, encouraged patient to follow through with plans to research Al-Anon  Plan: Return in  2 weeks  Diagnosis: Axis I: Bipolar, Depressed    Axis II: No diagnosis  Collaboration of Care: Psychiatrist AEB patient working with psychiatrist Dr. Harrington Challenger, clinician reviewing chart.  Patient/Guardian was  advised Release of Information must be obtained prior to any record release in order to collaborate their care with an outside provider. Patient/Guardian was advised if they have not already done so to contact the registration department to sign all necessary forms in order for [place] to release information regarding their care.   Consent: Patient/Guardian gives verbal consent for treatment and assignment of benefits for services provided during this visit. Patient/Guardian expressed understanding and agreed to proceed.   Roger Smoker, LCSW 03/31/2022

## 2022-03-31 NOTE — Addendum Note (Signed)
Addended by: Maurice Small E on: 03/31/2022 04:03 PM   Modules accepted: Level of Service

## 2022-04-14 ENCOUNTER — Ambulatory Visit (INDEPENDENT_AMBULATORY_CARE_PROVIDER_SITE_OTHER): Payer: Medicare Other | Admitting: Psychiatry

## 2022-04-14 DIAGNOSIS — F3130 Bipolar disorder, current episode depressed, mild or moderate severity, unspecified: Secondary | ICD-10-CM | POA: Diagnosis not present

## 2022-04-14 NOTE — Progress Notes (Signed)
Virtual Visit via Video Note  I connected with Roger Duran on 04/14/22 at 3:21 PM EST  by a video enabled telemedicine application and verified that I am speaking with the correct person using two identifiers.  Location: Patient: Home Provider: [place] office    I discussed the limitations of evaluation and management by telemedicine and the availability of in person appointments. The patient expressed understanding and agreed to proceed.   I provided 39 minutes of non-face-to-face time during this encounter.   Roger Smoker, LCSW      THERAPIST PROGRESS NOTE     Session Time: Thursday 04/14/2022 3:21 PM - 4:00 PM    Participation Level: Active     Behavioral Response: Casual/ Alert/Depressed   Type of Therapy: Individual Therapy  Treatment Goals addressed: Patient will practice behavioral activation skills 2-3 times per week for the next 12 weeksoseph "Anthony" WILL IDENTIFY 3 COGNITIVE PATTERNS AND BELIEFS THAT SUPPORT DEPRESSION  Progress on goals: Progressing  Interventions: CBT and Supportive  Summary: Roger Duran is a 51 y.o. male who  is referrred for services by psychiatrist Dr. Harrington Challenger for continuity of care. Patient has history of bipolar disorder and was seeing S. Schneidmiller for therapy. She now is out on medical leave. He reports one psychiatric hospitalization due to suicide attempt in 2009. He received outpatient treatment at [facility]. He eventually saw Ms. Schenidmiller and psychiatrist Dr. Harrington Challenger.  Patient reports he tends to have depressive and missed episodes.  He also reports constant anxiety along with paranoia and delusional thinking at times.  He reports being depressed most of the time and having to force himself out of bed.  Patient last was seen via virtual visit about 2-3 weeks ago.  He reports increased symptoms of depression along with excessive worry since last session.  He had an MRI of his foot last week and met with his  doctor this week for consultation.  The MRI results indicated tendons and ligaments were torn during his foot injury this past summer.  He now has scar tissue and the only treatment recommended is physical therapy per patient's report.  He is able to have steroid injections about every 6 weeks to try to help alleviate the pain temporarily.  Patient reports experiencing constant pain and walking with a limp.  He has thoughts of not being able to do anything.  He reports staying in bed and not doing anything for about 2 days after meeting with his doctor.  He reports eventually forcing himself out of bed and visiting his parents.  He reports continued stress regarding family issues due to dynamics being changed as a result of brother and his wife separating.  He expresses sadness, hurt, and frustration holiday traditions and family celebrations will not be the same.  He also worries about his mother whom he says cries constantly regarding the break-up of the family.  Patient is experiencing less financial stress and is pleased about this.    Suicidal/Homicidal: No, without intent/plan   Therapist Response: reviewed symptoms, discussed stressors, facilitated expression of thoughts and feelings, validated feelings, assisted patient identify/challenge/and replace negative thought patterns about his capabilities related to foot injury with more rational thought patterns, assisted patient explore possible activities he can still pursue and discussed possible options regarding physical therapy, assisted patient replace should and ought thoughts regarding current family dynamics with wish/prefer, assisted patient explore options regarding identifying ways to maintain healthy connections and celebrate holiday/family events   Plan: Return  in 2 weeks  Diagnosis: Axis I: Bipolar, Depressed    Axis II: No diagnosis  Collaboration of Care: Psychiatrist AEB patient working with psychiatrist Dr. Harrington Challenger, clinician reviewing  chart.  Patient/Guardian was advised Release of Information must be obtained prior to any record release in order to collaborate their care with an outside provider. Patient/Guardian was advised if they have not already done so to contact the registration department to sign all necessary forms in order for [place] to release information regarding their care.   Consent: Patient/Guardian gives verbal consent for treatment and assignment of benefits for services provided during this visit. Patient/Guardian expressed understanding and agreed to proceed.   Roger Smoker, LCSW 04/14/2022

## 2022-04-15 ENCOUNTER — Telehealth (HOSPITAL_COMMUNITY): Payer: Self-pay | Admitting: Psychiatry

## 2022-04-15 NOTE — Telephone Encounter (Signed)
Opened in error

## 2022-04-28 ENCOUNTER — Ambulatory Visit (INDEPENDENT_AMBULATORY_CARE_PROVIDER_SITE_OTHER): Payer: Medicare Other | Admitting: Psychiatry

## 2022-04-28 DIAGNOSIS — F3130 Bipolar disorder, current episode depressed, mild or moderate severity, unspecified: Secondary | ICD-10-CM

## 2022-04-28 NOTE — Progress Notes (Signed)
Virtual Visit via Video Note  I connected with Roger Duran on 04/28/22 at 2:14 PM EST by a video enabled telemedicine application and verified that I am speaking with the correct person using two identifiers.  Location: Patient: Home Provider: [place] office    I discussed the limitations of evaluation and management by telemedicine and the availability of in person appointments. The patient expressed understanding and agreed to proceed.  I provided 44 minutes of non-face-to-face time during this encounter.   [name], LCSW     THERAPIST PROGRESS NOTE     Session Time: Thursday 04/28/2022 2:14 PM  - 2:58 PM    Participation Level: Active     Behavioral Response: Casual/ Alert/Depressed   Type of Therapy: Individual Therapy  Treatment Goals addressed: Patient will practice behavioral activation skills 2-3 times per week for the next 12 weeksoseph "Anthony" WILL IDENTIFY 3 COGNITIVE PATTERNS AND BELIEFS THAT SUPPORT DEPRESSION  Progress on goals: Progressing  Interventions: CBT and Supportive  Summary: Roger Duran is a 51 y.o. male who  is referrred for services by psychiatrist Dr. Harrington Challenger for continuity of care. Patient has history of bipolar disorder and was seeing S. Schneidmiller for therapy. She now is out on medical leave. He reports one psychiatric hospitalization due to suicide attempt in 2009. He received outpatient treatment at [facility]. He eventually saw Ms. Schenidmiller and psychiatrist Dr. Harrington Challenger.  Patient reports he tends to have depressive and missed episodes.  He also reports constant anxiety along with paranoia and delusional thinking at times.  He reports being depressed most of the time and having to force himself out of bed.  Patient last was seen via virtual visit about 2-3 weeks ago.  He reports continued symptoms of depression along with excessive worry since last session. His 53 yo aunt with whom he is very close was diagnosed  with pancreatic cancer just before Christmas.  He reports tumors have been found in 2 other places in her body.  She is in the hospital and has stated she may not pursue treatment per patient's report.  He reports he did see his niece for Christmas and states being happy about this.  However, he still worries about the dynamics of the family due to his brother's marital separation.  He expresses frustration as he cannot change the situation.  He says he is trying to maintain a steady routine regarding taking his roommate to work and visiting his family.  Ports continuing to enjoy having a roommate and reports recently enjoying going to a movie with his roommate.  He expresses increased acceptance regarding the condition of his foot but still has not started doing any type of physical therapy or exercise.      Suicidal/Homicidal: No, without intent/plan   Therapist Response: reviewed symptoms, discussed stressors, facilitated expression of thoughts and feelings, validated feelings, facilitated patient expressing thoughts and feelings about his relationship with his aunt, praised and reinforced patient's efforts to maintain a consistent routine, began to explore ways to increase activity, assisted patient identify realistic expectations regarding interaction with his niece and nephew, assisted patient identify ways to be supportive to brother   Plan: Return in 2 weeks  Diagnosis: Axis I: Bipolar, Depressed    Axis II: No diagnosis  Collaboration of Care: Psychiatrist AEB patient working with psychiatrist Dr. Harrington Challenger, clinician reviewing chart.  Patient/Guardian was advised Release of Information must be obtained prior to any record release in order to collaborate their care with  an outside provider. Patient/Guardian was advised if they have not already done so to contact the registration department to sign all necessary forms in order for [place] to release information regarding their care.   Consent:  Patient/Guardian gives verbal consent for treatment and assignment of benefits for services provided during this visit. Patient/Guardian expressed understanding and agreed to proceed.   [name], LCSW 04/28/2022

## 2022-05-19 ENCOUNTER — Ambulatory Visit (INDEPENDENT_AMBULATORY_CARE_PROVIDER_SITE_OTHER): Payer: Medicare Other | Admitting: Psychiatry

## 2022-05-19 DIAGNOSIS — F411 Generalized anxiety disorder: Secondary | ICD-10-CM | POA: Diagnosis not present

## 2022-05-19 DIAGNOSIS — F3130 Bipolar disorder, current episode depressed, mild or moderate severity, unspecified: Secondary | ICD-10-CM | POA: Diagnosis not present

## 2022-05-19 NOTE — Progress Notes (Signed)
Virtual Visit via Video Note  I connected with Roger Duran on 05/19/22 at  4:00 PM EST by a video enabled telemedicine application and verified that I am speaking with the correct person using two identifiers.  Location: Patient: Home Provider: [place] office    I discussed the limitations of evaluation and management by telemedicine and the availability of in person appointments. The patient expressed understanding and agreed to proceed.  I provided 32 minutes of non-face-to-face time during this encounter.   Roger Smoker, LCSW      THERAPIST PROGRESS NOTE     Session Time: Thursday 05/19/2022 4:05 PM - 4:37 PM    Participation Level: Active     Behavioral Response: Casual/ Alert/Depressed   Type of Therapy: Individual Therapy  Treatment Goals addressed: Patient will practice behavioral activation skills 2-3 times per week for the next 12 weeksoseph "Anthony" WILL IDENTIFY 3 COGNITIVE PATTERNS AND BELIEFS THAT SUPPORT DEPRESSION  Progress on goals: Progressing  Interventions: CBT and Supportive  Summary: Roger Duran is a 52 y.o. male who  is referrred for services by psychiatrist Dr. Harrington Challenger for continuity of care. Patient has history of bipolar disorder and was seeing S. Schneidmiller for therapy. She now is out on medical leave. He reports one psychiatric hospitalization due to suicide attempt in 2009. He received outpatient treatment at [facility]. He eventually saw Ms. Schenidmiller and psychiatrist Dr. Harrington Challenger.  Patient reports he tends to have depressive and missed episodes.  He also reports constant anxiety along with paranoia and delusional thinking at times.  He reports being depressed most of the time and having to force himself out of bed.  Patient last was seen via virtual visit about 2-3 weeks ago.  He reports continued increased stress, sadness, and irritability since last session.  Per patient's report, he and his family had COVID.  He reports  little to no involvement in activity due to being sick. He also reports stress regarding his aunt who has pancreatic cancer.  Per patient's report, cancer has metastasized to various other parts of her body.  His aunt has decided to discontinue treatment.  She remains in the hospital and patient plans to visit her next week.  He expresses sadness and disappointment.  He worries about about the effects of his aunt's condition on his family especially his father who has become very quiet per patient's report.  He is thankful he and his family have grown closer and are being supportive to each other.  He also reports support from his roommate and says things are still going well.  Patient reports less stress and worry regarding his foot as he is able to walk on it normally most of the time.  He reports occasionally limping.  Patient reports he is concerned about his weight and eating patterns.  Per his report, he has gained a significant amount of weight.  He continues to not eat during the day but then overeats at night.  Patient is concerned he has developed an unhealthy relationship with food.  He and therapist agreed to discuss more at next session.  Patient and therapist agreed to end session early today as patient is not feeling well.         Suicidal/Homicidal: No, without intent/plan   Therapist Response: reviewed symptoms, discussed stressors, facilitated expression of thoughts and feelings, validated feelings, normalized feelings related to grief and loss regarding his aunt, discussed trying to resume a normal routine to promote  a sense  of normalcy, developed plan with patient to begin to eat a small meal for breakfast, agreed to discuss eating issues more at next session Diagnosis: Axis I: Bipolar, Depressed    Axis II: No diagnosis  Collaboration of Care: Psychiatrist AEB patient working with psychiatrist Dr. Harrington Challenger, clinician reviewing chart.  Patient/Guardian was advised Release of Information  must be obtained prior to any record release in order to collaborate their care with an outside provider. Patient/Guardian was advised if they have not already done so to contact the registration department to sign all necessary forms in order for [place] to release information regarding their care.   Consent: Patient/Guardian gives verbal consent for treatment and assignment of benefits for services provided during this visit. Patient/Guardian expressed understanding and agreed to proceed.   Roger Smoker, LCSW 05/19/2022

## 2022-06-01 ENCOUNTER — Ambulatory Visit (INDEPENDENT_AMBULATORY_CARE_PROVIDER_SITE_OTHER): Payer: Medicare Other | Admitting: Psychiatry

## 2022-06-01 DIAGNOSIS — F411 Generalized anxiety disorder: Secondary | ICD-10-CM | POA: Diagnosis not present

## 2022-06-01 DIAGNOSIS — F3130 Bipolar disorder, current episode depressed, mild or moderate severity, unspecified: Secondary | ICD-10-CM

## 2022-06-01 NOTE — Progress Notes (Signed)
Virtual Visit via Video Note  I connected with Roger Duran on 06/01/22 at 3:20 PM EST  by a video enabled telemedicine application and verified that I am speaking with the correct person using two identifiers.  Location: Patient: Home Provider: [place] office    I discussed the limitations of evaluation and management by telemedicine and the availability of in person appointments. The patient expressed understanding and agreed to proceed.  I provided 43 minutes of non-face-to-face time during this encounter.   [name], LCSW     THERAPIST PROGRESS NOTE     Session Time: Wednesday 06/01/2022 3:20 PM - 4:03 PM    Participation Level: Active     Behavioral Response: Casual/ Alert/Depressed   Type of Therapy: Individual Therapy  Treatment Goals addressed: Patient will practice behavioral activation skills 2-3 times per week for the next 12 weeksoseph "Anthony" WILL IDENTIFY 3 COGNITIVE PATTERNS AND BELIEFS THAT SUPPORT DEPRESSION  Progress on goals: Progressing  Interventions: CBT and Supportive  Summary: Roger Duran is a 52 y.o. male who  is referrred for services by psychiatrist Dr. Harrington Challenger for continuity of care. Patient has history of bipolar disorder and was seeing S. Schneidmiller for therapy. She now is out on medical leave. He reports one psychiatric hospitalization due to suicide attempt in 2009. He received outpatient treatment at [facility]. He eventually saw Ms. Schenidmiller and psychiatrist Dr. Harrington Challenger.  Patient reports he tends to have depressive and missed episodes.  He also reports constant anxiety along with paranoia and delusional thinking at times.  He reports being depressed most of the time and having to force himself out of bed.  Patient last was seen via virtual visit about 2-3 weeks ago.  He reports decreased stress and worry about his aunt since last session.  Per patient's report, he and mother recently visited his aunt in the  hospital and says she was in good spirits.  He expresses appropriate concern but says he uses prayer and his spirituality to cope.  Patient reports fatigue and poor motivation.  he attributes part of this to his sleep schedule changing due to his roommate's work hours.  However, he Duran this will change as his roommate has gotten another job.  Patient reports a tendency of napping during the day sometimes most of the day.  He then experiences frustration as he has not accomplished tasks. He reports little to no structure in his day.  He also reports continued poor eating patterns and states only eating 1 meal per day.  He reports skipping meals as he is self-conscious about his appearance.  He states being down most of the time and just not being happy.         Suicidal/Homicidal: No, without intent/plan   Therapist Response: reviewed symptoms, discussed stressors, facilitated expression of thoughts and feelings, validated feelings, assisted patient identify thoughts and processes that interfere with implementation of plan to eat a small meal for breakfast, discussed the role healthy eating patterns in coping with depression as well as weight management, developed plan with patient to eat breakfast, discussed the role of daily structure and routine, developed plan with patient to use daily planning to improve daily routine and structure, will send patient handouts via mail, developed plan with patient to complete and bring to next session,    Diagnosis: Axis I: Bipolar, Depressed    Axis II: No diagnosis  Collaboration of Care: Psychiatrist AEB patient working with psychiatrist Dr. Harrington Challenger, clinician reviewing chart.  Patient/Guardian was advised Release of Information must be obtained prior to any record release in order to collaborate their care with an outside provider. Patient/Guardian was advised if they have not already done so to contact the registration department to sign all necessary forms in  order for [place] to release information regarding their care.   Consent: Patient/Guardian gives verbal consent for treatment and assignment of benefits for services provided during this visit. Patient/Guardian expressed understanding and agreed to proceed.   [name], LCSW 06/01/2022

## 2022-06-15 ENCOUNTER — Ambulatory Visit (INDEPENDENT_AMBULATORY_CARE_PROVIDER_SITE_OTHER): Payer: Medicare Other | Admitting: Psychiatry

## 2022-06-15 DIAGNOSIS — F3130 Bipolar disorder, current episode depressed, mild or moderate severity, unspecified: Secondary | ICD-10-CM | POA: Diagnosis not present

## 2022-06-15 DIAGNOSIS — F411 Generalized anxiety disorder: Secondary | ICD-10-CM | POA: Diagnosis not present

## 2022-06-15 NOTE — Progress Notes (Unsigned)
Virtual Visit via Video Note  I connected with [name] on 06/15/22 at 3:15 PM EST  by a video enabled telemedicine application and verified that I am speaking with the correct person using two identifiers.  Location: Patient: Home Provider: [place] Office    I discussed the limitations of evaluation and management by telemedicine and the availability of in person appointments. The patient expressed understanding and agreed to proceed.  I provided 45 minutes of non-face-to-face time during this encounter.   [name], LCSW    THERAPIST PROGRESS NOTE     Session Time: Wednesday 06/15/2022 3:15 PM  - 4:00 PM    Participation Level: Active     Behavioral Response: Casual/ Alert/Depressed   Type of Therapy: Individual Therapy  Treatment Goals addressed: Patient will practice behavioral activation skills 2-3 times per week for the next 12 weeksoseph "Anthony" WILL IDENTIFY 3 COGNITIVE PATTERNS AND BELIEFS THAT SUPPORT DEPRESSION  Progress on goals: Progressing  Interventions: CBT and Supportive  Summary: DONALD NORDMARK is a 52 y.o. male who  is referrred for services by psychiatrist Dr. Harrington Challenger for continuity of care. Patient has history of bipolar disorder and was seeing S. Schneidmiller for therapy. She now is out on medical leave. He reports one psychiatric hospitalization due to suicide attempt in 2009. He received outpatient treatment at [facility]. He eventually saw Ms. Schenidmiller and psychiatrist Dr. Harrington Challenger.  Patient reports he tends to have depressive and missed episodes.  He also reports constant anxiety along with paranoia and delusional thinking at times.  He reports being depressed most of the time and having to force himself out of bed.  Patient last was seen via virtual visit about 2-3 weeks ago.  He reports increased stress and worry about his aunt as she came home yesterday and now is in hospice care.  Cancer metastasized and an is not having  any more treatment.  Patient expresses worry and sadness.  He states feeling manageable and dissatisfied with his life.  He reports financial stress.  He is able to meet all of his financial applications but reports having little money left over.  He has been doing problem-solving and is hopeful about switching to a another insurance provider that provides a lower rate.  He reports implementing plan eating breakfast daily.  Per his report, he is feeling less queasy and is starting to experience a little more energy.  He reports difficulty implementing plan of using daily planning due to various things that may happen during the day.  He still expresses frustration with self regarding his physical appearance.  He reports having free gym membership but has not gone to the gym.  He reports poor motivation.         Suicidal/Homicidal: No, without intent/plan   Therapist Response: reviewed symptoms, as and reinforced patient's efforts to improve eating patterns, discussed effects, discussed stressors, facilitated expression of thoughts and feelings, validated feelings, normalized feelings related to grief and loss issues, assisted patient prioritize issues he wants to address, developed plan with patient to begin going to the gym 3 days a week (Tuesday Thursday Friday afternoons and walking on treadmill for 10 minutes), assisted patient identify and address thoughts and processes that may inhibit implementation of plan, assisted patient develop list of patients for attending gym/consequences of not attending gym, assisted patient link plan with his goals/values, obtained patient's commitment to implement plan  Diagnosis: Axis I: Bipolar, Depressed    Axis II: No diagnosis  Collaboration of Care: Psychiatrist AEB patient working with psychiatrist Dr. Harrington Challenger, clinician reviewing chart.  Patient/Guardian was advised Release of Information must be obtained prior to any record release in order to collaborate their  care with an outside provider. Patient/Guardian was advised if they have not already done so to contact the registration department to sign all necessary forms in order for [place] to release information regarding their care.   Consent: Patient/Guardian gives verbal consent for treatment and assignment of benefits for services provided during this visit. Patient/Guardian expressed understanding and agreed to proceed.   [name], LCSW 06/15/2022

## 2022-06-22 ENCOUNTER — Encounter (HOSPITAL_COMMUNITY): Payer: Self-pay | Admitting: Psychiatry

## 2022-06-22 ENCOUNTER — Telehealth (INDEPENDENT_AMBULATORY_CARE_PROVIDER_SITE_OTHER): Payer: Medicare Other | Admitting: Psychiatry

## 2022-06-22 DIAGNOSIS — F3130 Bipolar disorder, current episode depressed, mild or moderate severity, unspecified: Secondary | ICD-10-CM | POA: Diagnosis not present

## 2022-06-22 DIAGNOSIS — F411 Generalized anxiety disorder: Secondary | ICD-10-CM

## 2022-06-22 MED ORDER — ALPRAZOLAM 2 MG PO TABS: 2.0000 mg | ORAL_TABLET | Freq: Four times a day (QID) | ORAL | 2 refills | Status: DC

## 2022-06-22 MED ORDER — LAMOTRIGINE 200 MG PO TABS: ORAL_TABLET | ORAL | 2 refills | Status: DC

## 2022-06-22 MED ORDER — FLUOXETINE HCL 20 MG PO CAPS: 20.0000 mg | ORAL_CAPSULE | Freq: Every day | ORAL | 2 refills | Status: DC

## 2022-06-22 MED ORDER — BUPROPION HCL ER (XL) 150 MG PO TB24: 150.0000 mg | ORAL_TABLET | ORAL | 2 refills | Status: DC

## 2022-06-22 MED ORDER — LURASIDONE HCL 120 MG PO TABS: 120.0000 mg | ORAL_TABLET | Freq: Every day | ORAL | 2 refills | Status: DC

## 2022-06-22 MED ORDER — BELSOMRA 20 MG PO TABS: 20.0000 mg | ORAL_TABLET | Freq: Every day | ORAL | 2 refills | Status: DC
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
## 2022-06-28 ENCOUNTER — Ambulatory Visit (HOSPITAL_COMMUNITY): Payer: Medicare Other | Admitting: Psychiatry

## 2022-06-28 ENCOUNTER — Telehealth (HOSPITAL_COMMUNITY): Payer: Self-pay | Admitting: Psychiatry

## 2022-06-28 NOTE — Telephone Encounter (Signed)
Therapist called patient regarding scheduled appointment.  Patient reports not feeling well due to a severe headach.  Therapist and patient agreed to cancel today's appointment.

## 2022-06-29 ENCOUNTER — Ambulatory Visit (HOSPITAL_COMMUNITY): Payer: Medicare Other | Admitting: Psychiatry

## 2022-07-11 ENCOUNTER — Ambulatory Visit: Payer: Medicare Other | Admitting: Dermatology

## 2022-07-12 ENCOUNTER — Ambulatory Visit: Payer: Medicare Other | Admitting: Physician Assistant

## 2022-08-03 ENCOUNTER — Telehealth (HOSPITAL_COMMUNITY): Payer: Medicare Other | Admitting: Psychiatry

## 2022-08-09 ENCOUNTER — Telehealth (INDEPENDENT_AMBULATORY_CARE_PROVIDER_SITE_OTHER): Payer: Medicare Other | Admitting: Psychiatry

## 2022-08-09 ENCOUNTER — Encounter (HOSPITAL_COMMUNITY): Payer: Self-pay | Admitting: Psychiatry

## 2022-08-09 DIAGNOSIS — F411 Generalized anxiety disorder: Secondary | ICD-10-CM

## 2022-08-09 DIAGNOSIS — F3130 Bipolar disorder, current episode depressed, mild or moderate severity, unspecified: Secondary | ICD-10-CM

## 2022-08-09 MED ORDER — BELSOMRA 20 MG PO TABS: 20.0000 mg | ORAL_TABLET | Freq: Every day | ORAL | 2 refills | Status: DC

## 2022-08-09 MED ORDER — BUPROPION HCL ER (XL) 150 MG PO TB24: 150.0000 mg | ORAL_TABLET | ORAL | 2 refills | Status: DC

## 2022-08-09 MED ORDER — ALPRAZOLAM 2 MG PO TABS: 2.0000 mg | ORAL_TABLET | Freq: Four times a day (QID) | ORAL | 2 refills | Status: DC

## 2022-08-09 MED ORDER — LURASIDONE HCL 120 MG PO TABS: 120.0000 mg | ORAL_TABLET | Freq: Every day | ORAL | 2 refills | Status: DC

## 2022-08-09 MED ORDER — LAMOTRIGINE 200 MG PO TABS: ORAL_TABLET | ORAL | 2 refills | Status: DC

## 2022-08-09 MED ORDER — FLUOXETINE HCL 20 MG PO CAPS: 20.0000 mg | ORAL_CAPSULE | Freq: Every day | ORAL | 2 refills | Status: DC
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
## 2022-08-10 ENCOUNTER — Ambulatory Visit (INDEPENDENT_AMBULATORY_CARE_PROVIDER_SITE_OTHER): Payer: Medicare Other | Admitting: Psychiatry

## 2022-08-10 DIAGNOSIS — F411 Generalized anxiety disorder: Secondary | ICD-10-CM | POA: Diagnosis not present

## 2022-08-10 DIAGNOSIS — F3130 Bipolar disorder, current episode depressed, mild or moderate severity, unspecified: Secondary | ICD-10-CM
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
## 2022-09-06 ENCOUNTER — Ambulatory Visit (HOSPITAL_COMMUNITY): Payer: Medicare Other | Admitting: Psychiatry

## 2022-09-06 ENCOUNTER — Encounter (HOSPITAL_COMMUNITY): Payer: Self-pay

## 2022-09-06 ENCOUNTER — Telehealth (HOSPITAL_COMMUNITY): Payer: Self-pay | Admitting: Psychiatry

## 2022-09-06 NOTE — Telephone Encounter (Signed)
Therapist attempted to contact patient via text through caregility platform for scheduled appointment, no response.  Therapist called patient, left message indicating attempt, and requesting patient call office. 

## 2022-10-04 ENCOUNTER — Ambulatory Visit (HOSPITAL_COMMUNITY): Payer: Medicare Other | Admitting: Psychiatry

## 2022-10-18 ENCOUNTER — Ambulatory Visit (HOSPITAL_COMMUNITY): Payer: Medicare Other | Admitting: Psychiatry

## 2022-11-01 ENCOUNTER — Encounter (HOSPITAL_COMMUNITY): Payer: Self-pay | Admitting: Psychiatry

## 2022-11-01 ENCOUNTER — Telehealth (INDEPENDENT_AMBULATORY_CARE_PROVIDER_SITE_OTHER): Payer: Medicare Other | Admitting: Psychiatry

## 2022-11-01 DIAGNOSIS — F3130 Bipolar disorder, current episode depressed, mild or moderate severity, unspecified: Secondary | ICD-10-CM | POA: Diagnosis not present

## 2022-11-01 DIAGNOSIS — F411 Generalized anxiety disorder: Secondary | ICD-10-CM

## 2022-11-01 MED ORDER — LURASIDONE HCL 120 MG PO TABS: 120.0000 mg | ORAL_TABLET | Freq: Every day | ORAL | 2 refills | Status: DC

## 2022-11-01 MED ORDER — BELSOMRA 20 MG PO TABS: 20.0000 mg | ORAL_TABLET | Freq: Every day | ORAL | 2 refills | Status: DC

## 2022-11-01 MED ORDER — FLUOXETINE HCL 20 MG PO CAPS: 20.0000 mg | ORAL_CAPSULE | Freq: Every day | ORAL | 2 refills | Status: DC

## 2022-11-01 MED ORDER — LAMOTRIGINE 200 MG PO TABS: ORAL_TABLET | ORAL | 2 refills | Status: DC

## 2022-11-01 MED ORDER — BUPROPION HCL ER (XL) 150 MG PO TB24: 150.0000 mg | ORAL_TABLET | ORAL | 2 refills | Status: DC

## 2022-11-01 MED ORDER — ALPRAZOLAM 2 MG PO TABS: 2.0000 mg | ORAL_TABLET | Freq: Four times a day (QID) | ORAL | 2 refills | Status: DC

## 2022-11-01 NOTE — Progress Notes (Signed)
BH MD/PA/NP OP Progress Note  11/01/2022 2:16 PM Roger Duran  MRN:  [id number]  Chief Complaint:  Chief Complaint  Patient presents with   Anxiety   Depression   Manic Behavior   Follow-up   HPI: This patient is a 52 year old single white male who lives with a roommate in [place].  He is on disability for bipolar disorder.  The patient returns for follow-up after 3 months regarding his bipolar disorder.  He states overall he is doing fairly well.  There has been some conflicts in the family.  His brother is in the process of getting separated from his wife and apparently the 2 families have been arguing quite a bit.  Also he reports his father drinks a lot in the evenings and can be very difficult to get a long with.  He is in his brother are trying to be very supportive of their mother.  Overall however his mood has been good.  He denies significant depression or manic symptoms.  He is sleeping well and does not always need the Belsomra.  His anxiety is under good control.  His tremor has improved since the neurologist have been in adjusting the brain implants. Visit Diagnosis:    ICD-10-CM   1. Bipolar I disorder, most recent episode depressed (HCC)  F31.30     2. GAD (generalized anxiety disorder)  F41.1       Past Psychiatric History: Hospitalization in 2009 after suicide attempt, otherwise outpatient treatment  Past Medical History:  Past Medical History:  Diagnosis Date   Anxiety    Bipolar 1 disorder (HCC)    Depression    Mania (HCC)    Psoriasis     Past Surgical History:  Procedure Laterality Date   APPENDECTOMY     back procedures     COLONOSCOPY N/A 11/14/2014   Procedure: COLONOSCOPY;  Surgeon: Malissa Hippo, MD;  Location: [facility];  Service: Endoscopy;  Laterality: N/A;  11:10 - moved to 8:30 - Ann to notify   TONSILLECTOMY     WRIST SURGERY      Family Psychiatric History: see below  Family History:  Family History  Problem Relation Age  of Onset   Hyperlipidemia Mother    Heart disease Father    Anxiety disorder Father    Bipolar disorder Maternal Grandfather     Social History:  Social History   Socioeconomic History   Marital status: Single    Spouse name: Not on file   Number of children: Not on file   Years of education: Not on file   Highest education level: Not on file  Occupational History   Not on file  Tobacco Use   Smoking status: Former    Types: Cigarettes, E-cigarettes    Quit date: 11/21/2019    Years since quitting: 2.9   Smokeless tobacco: Former  Building services engineer Use: Never used  Substance and Sexual Activity   Alcohol use: No    Alcohol/week: 0.0 standard drinks of alcohol    Comment: rarely   Drug use: No    Comment: 1995-2000 PER PT HE DID A LOT OF COCAINE AND PILLS AND DRANK A LOT   Sexual activity: Not Currently    Partners: Male    Birth control/protection: Condom  Other Topics Concern   Not on file  Social History Narrative   Not on file   Social Determinants of Health   Financial Resource Strain: Not on file  Food Insecurity: Not on file  Transportation Needs: Not on file  Physical Activity: Not on file  Stress: Not on file  Social Connections: Not on file    Allergies:  Allergies  Allergen Reactions   Apple Juice Itching and Swelling    Throat and mouth itching and swelling Throat and mouth itching and swelling   Bee Venom Anaphylaxis and Other (See Comments)    Weak, shaky, dizzy. Weak, shaky, dizzy.   Morphine Palpitations   Aspirin Nausea And Vomiting   Sulfa Antibiotics Nausea And Vomiting   Dicyclomine Other (See Comments)    Aka Dicyclomine Aka Dicyclomine   Other Other (See Comments)    Aka Dicyclomine   Penicillins Itching and Rash    Metabolic Disorder Labs: No results found for: "HGBA1C", "MPG" No results found for: "PROLACTIN" No results found for: "CHOL", "TRIG", "HDL", "CHOLHDL", "VLDL", "LDLCALC" No results found for:  "TSH"  Therapeutic Level Labs: Lab Results  Component Value Date   LITHIUM 1.10 10/15/2014   No results found for: "VALPROATE" No results found for: "CBMZ"  Current Medications: Current Outpatient Medications  Medication Sig Dispense Refill   acetaminophen (TYLENOL) 500 MG tablet Take by mouth.     acyclovir (ZOVIRAX) 400 MG tablet TAKE (1) TABLET THREE TIMES DAILY AS NEEDED. 60 tablet 10   Adalimumab (HUMIRA PEN) 40 MG/0.4ML PNKT INJECT 40MG  SUBCUTANEOUSLY EVERY 2 WEEKS AS DIRECTED. 2 each 8   alprazolam (XANAX) 2 MG tablet Take 1 tablet (2 mg total) by mouth in the morning, at noon, in the evening, and at bedtime. 120 tablet 2   baclofen (LIORESAL) 10 MG tablet Take 10 mg by mouth 2 (two) times daily as needed.     buPROPion (WELLBUTRIN XL) 150 MG 24 hr tablet Take 1 tablet (150 mg total) by mouth every morning. 30 tablet 2   clobetasol (TEMOVATE) 0.05 % external solution Apply 1 application topically 2 (two) times daily. 50 mL 9   cycloSPORINE (RESTASIS) 0.05 % ophthalmic emulsion Place 1 drop into both eyes 2 (two) times daily.     EPINEPHrine 0.3 mg/0.3 mL IJ SOAJ injection Inject into the muscle.     FLUoxetine (PROZAC) 20 MG capsule Take 1 capsule (20 mg total) by mouth daily. 30 capsule 2   gabapentin (NEURONTIN) 600 MG tablet Take 600 mg by mouth 4 (four) times daily.     lamoTRIgine (LAMICTAL) 200 MG tablet TAKE  (1)  TABLET TWICE A DAY. 60 tablet 2   Lurasidone HCl (LATUDA) 120 MG TABS Take 1 tablet (120 mg total) by mouth daily. 90 tablet 2   Multiple Vitamins-Minerals (MENS 50+ MULTI VITAMIN/MIN) TABS Take 1 tablet by mouth every morning.     Omega-3 Fatty Acids (COROMEGA OMEGA 3 SQUEEZE) EMUL Take by mouth.     omeprazole (PRILOSEC) 20 MG capsule Take 20 mg by mouth daily.     Potassium Gluconate 2.5 MEQ TABS Take by mouth.     Suvorexant (BELSOMRA) 20 MG TABS Take 1 tablet (20 mg total) by mouth at bedtime. 30 tablet 2   tadalafil (CIALIS) 5 MG tablet Take 5 mg by mouth  daily.     VEMLIDY 25 MG TABS Take 1 tablet by mouth daily.     No current facility-administered medications for this visit.     Musculoskeletal: Strength & Muscle Tone: within normal limits Gait & Station: normal Patient leans: N/A  Psychiatric Specialty Exam: Review of Systems  Neurological:  Positive for tremors.  All other systems  reviewed and are negative.   There were no vitals taken for this visit.There is no height or weight on file to calculate BMI.  General Appearance: Casual and Fairly Groomed  Eye Contact:  Good  Speech:  Clear and Coherent  Volume:  Normal  Mood:  Euthymic  Affect:  Congruent  Thought Process:  Goal Directed  Orientation:  Full (Time, Place, and Person)  Thought Content: WDL   Suicidal Thoughts:  No  Homicidal Thoughts:  No  Memory:  Immediate;   Good Recent;   Good Remote;   Good  Judgement:  Good  Insight:  Good  Psychomotor Activity:  Tremor  Concentration:  Concentration: Good and Attention Span: Good  Recall:  Good  Fund of Knowledge: Good  Language: Good  Akathisia:  No  Handed:  Right  AIMS (if indicated): not done  Assets:  Communication Skills Desire for Improvement Resilience Social Support Talents/Skills  ADL's:  Intact  Cognition: WNL  Sleep:  Good   Screenings: PHQ2-9    Flowsheet Row Counselor from 01/20/2022 in [facility] at [place] Video Visit from 12/22/2021 in [facility] at [place] Video Visit from 10/19/2021 in [facility] at [place] Video Visit from 09/15/2021 in [facility] at [place] Video Visit from 08/12/2021 in [facility] at [name] Total Score 2 1 0 0 2  PHQ-9 Total Score 11 -- -- -- 7      Flowsheet Row Counselor from 01/20/2022 in [facility] at [place] ED from 01/16/2022 in [facility] Emergency Department  at [facility] Video Visit from 12/22/2021 in [facility] at [place]  C-SSRS RISK CATEGORY Low Risk No Risk No Risk        Assessment and Plan:  This patient is a 52 year old male with a history of bipolar disorder and anxiety.  He is doing well on his current regimen.  He will continue Prozac 20 mg daily for depression, Lamictal 200 mg daily and lurasidone 120 mg daily for mood stabilization, Xanax 2 mg 4 times daily for anxiety, but Wellbutrin XL 150 mg daily for sexual side effects and Belsomra 20 mg at bedtime as needed for sleep.  He will return to see me in 3 months Collaboration of Care: Collaboration of Care: Primary Care Provider AEB notes are shared with PCP on the epic system  Patient/Guardian was advised Release of Information must be obtained prior to any record release in order to collaborate their care with an outside provider. Patient/Guardian was advised if they have not already done so to contact the registration department to sign all necessary forms in order for [place] to release information regarding their care.   Consent: Patient/Guardian gives verbal consent for treatment and assignment of benefits for services provided during this visit. Patient/Guardian expressed understanding and agreed to proceed.    [name], MD 11/01/2022, 2:16 PM

## 2022-11-29 ENCOUNTER — Ambulatory Visit (HOSPITAL_COMMUNITY): Payer: Medicare Other | Admitting: Psychiatry

## 2023-01-24 ENCOUNTER — Encounter (HOSPITAL_COMMUNITY): Payer: Self-pay | Admitting: Psychiatry

## 2023-01-24 ENCOUNTER — Telehealth (HOSPITAL_COMMUNITY): Payer: Medicare Other | Admitting: Psychiatry

## 2023-01-24 DIAGNOSIS — F3130 Bipolar disorder, current episode depressed, mild or moderate severity, unspecified: Secondary | ICD-10-CM

## 2023-01-24 DIAGNOSIS — F411 Generalized anxiety disorder: Secondary | ICD-10-CM

## 2023-01-24 DIAGNOSIS — F332 Major depressive disorder, recurrent severe without psychotic features: Secondary | ICD-10-CM

## 2023-01-24 MED ORDER — LURASIDONE HCL 80 MG PO TABS: 80.0000 mg | ORAL_TABLET | Freq: Every day | ORAL | 2 refills | Status: DC

## 2023-01-24 MED ORDER — LAMOTRIGINE 200 MG PO TABS: ORAL_TABLET | ORAL | 2 refills | Status: DC

## 2023-01-24 MED ORDER — BELSOMRA 20 MG PO TABS: 20.0000 mg | ORAL_TABLET | Freq: Every day | ORAL | 2 refills | Status: DC

## 2023-01-24 MED ORDER — FLUOXETINE HCL 20 MG PO CAPS: 20.0000 mg | ORAL_CAPSULE | Freq: Every day | ORAL | 2 refills | Status: DC

## 2023-01-24 MED ORDER — ALPRAZOLAM 2 MG PO TABS: 2.0000 mg | ORAL_TABLET | Freq: Four times a day (QID) | ORAL | 2 refills | Status: DC

## 2023-01-24 MED ORDER — BUPROPION HCL ER (XL) 150 MG PO TB24: 150.0000 mg | ORAL_TABLET | ORAL | 2 refills | Status: DC

## 2023-01-24 NOTE — Progress Notes (Signed)
Virtual Visit via Video Note  I connected with Roger Duran on 01/24/23 at  1:20 PM EDT by a video enabled telemedicine application and verified that I am speaking with the correct person using two identifiers.  Location: Patient: home Provider: office   I discussed the limitations of evaluation and management by telemedicine and the availability of in person appointments. The patient expressed understanding and agreed to proceed.     I discussed the assessment and treatment plan with the patient. The patient was provided an opportunity to ask questions and all were answered. The patient agreed with the plan and demonstrated an understanding of the instructions.   The patient was advised to call back or seek an in-person evaluation if the symptoms worsen or if the condition fails to improve as anticipated.  I provided 20 minutes of non-face-to-face time during this encounter.   [name], MD  [facility] MD/PA/NP OP Progress Note  01/24/2023 1:53 PM Roger Duran  MRN:  [id number]  Chief Complaint:  Chief Complaint  Patient presents with   Anxiety   Depression   Manic Behavior   HPI: This patient is a 52 year old single black male who lives with a roommate in [place].  He is on disability for bipolar disorder.  The patient returns for follow-up after 3 months regarding his bipolar disorder.  He states for the most part he is doing okay.  He has noticed that he has been moving his tongue a lot as well as his oral facial muscles.  This seems to be an involuntary response to the lurasidone.  He would like to try cutting it back before we add another agent such as Ingrezza to stop what looks like tardive dyskinesia.  He does not have any other twitches or abnormal motion in his arms or legs.  The patient is still dealing with a lot of stress.  His father still drinks heavily.  His brother was using narcotics inappropriately but seems to have stopped but the father and brother are  constantly fight and the patient gets caught in the middle.  He is going to try to take a break from them.  His mood is generally okay and he denies significant depression anxiety or difficulty sleeping. Visit Diagnosis:    ICD-10-CM   1. Bipolar I disorder, most recent episode depressed (HCC)  F31.30     2. GAD (generalized anxiety disorder)  F41.1     3. MDD (major depressive disorder), recurrent severe, without psychosis (HCC)  F33.2       Past Psychiatric History: Hospitalization in 2009 after suicide attempt, otherwise outpatient treatment  Past Medical History:  Past Medical History:  Diagnosis Date   Anxiety    Bipolar 1 disorder (HCC)    Depression    Mania (HCC)    Psoriasis     Past Surgical History:  Procedure Laterality Date   APPENDECTOMY     back procedures     COLONOSCOPY N/A 11/14/2014   Procedure: COLONOSCOPY;  Surgeon: Malissa Hippo, MD;  [facility];  Service: Endoscopy;  Laterality: N/A;  11:10 - moved to 8:30 - Ann to notify   TONSILLECTOMY     WRIST SURGERY      Family Psychiatric History: See below  Family History:  Family History  Problem Relation Age of Onset   Hyperlipidemia Mother    Heart disease Father    Anxiety disorder Father    Bipolar disorder Maternal Grandfather     Social  History:  Social History   Socioeconomic History   Marital status: Single    Spouse name: Not on file   Number of children: Not on file   Years of education: Not on file   Highest education level: Not on file  Occupational History   Not on file  Tobacco Use   Smoking status: Former    Current packs/day: 0.00    Types: Cigarettes, E-cigarettes    Quit date: 11/21/2019    Years since quitting: 3.1   Smokeless tobacco: Former  Building services engineer status: Never Used  Substance and Sexual Activity   Alcohol use: No    Alcohol/week: 0.0 standard drinks of alcohol    Comment: rarely   Drug use: No    Comment: 1995-2000 PER PT HE DID A LOT  OF COCAINE AND PILLS AND DRANK A LOT   Sexual activity: Not Currently    Partners: Male    Birth control/protection: Condom  Other Topics Concern   Not on file  Social History Narrative   Not on file   Social Determinants of Health   Financial Resource Strain: Low Risk  (08/29/2022)   Received from [facility], [facility]   Overall Financial Resource Strain (CARDIA)    Difficulty of Paying Living Expenses: Not hard at all  Food Insecurity: No Food Insecurity (08/29/2022)   [facility] from [facility], [facility]   Hunger Vital Sign    Worried About Running Out of Food in the Last Year: Never true    Ran Out of Food in the Last Year: Never true  Recent Concern: Food Insecurity - Food Insecurity Present (06/29/2022)   Received from [facility]   Hunger Vital Sign    Worried About Running Out of Food in the Last Year: Never true    Ran Out of Food in the Last Year: Sometimes true  Transportation Needs: No Transportation Needs (08/29/2022)   Received from [facility]   PRAPARE - Transportation    Lack of Transportation (Medical): No    Lack of Transportation (Non-Medical): No  Physical Activity: Insufficiently Active (08/29/2022)   Received from [facility], [facility]   Exercise Vital Sign    Days of Exercise per Week: 2 days    Minutes of Exercise per Session: 30 min  Stress: Stress Concern Present (08/29/2022)   Received from [facility], [facility] - Occupational Stress Questionnaire    Feeling of Stress : Rather much  Social Connections: Socially Integrated (08/29/2022)   Received from [facility], [facility]   Social Network    How would you rate your social network (family, work, friends)?: Good participation with social networks  Recent Concern: Social Connections - Somewhat Isolated (06/29/2022)   Received from [facility]   Social Network    How would you rate your social network  (family, work, friends)?: Restricted participation with some degree of social isolation    Allergies:  Allergies  Allergen Reactions   Apple Juice Itching and Swelling    Throat and mouth itching and swelling Throat and mouth itching and swelling   Bee Venom Anaphylaxis and Other (See Comments)    Weak, shaky, dizzy. Weak, shaky, dizzy.   Morphine Palpitations   Aspirin Nausea And Vomiting   Sulfa Antibiotics Nausea And Vomiting   Dicyclomine Other (See Comments)    Aka Dicyclomine Aka Dicyclomine   Other Other (See Comments)    Aka Dicyclomine  Penicillins Itching and Rash    Metabolic Disorder Labs: No results found for: "HGBA1C", "MPG" No results found for: "PROLACTIN" No results found for: "CHOL", "TRIG", "HDL", "CHOLHDL", "VLDL", "LDLCALC" No results found for: "TSH"  Therapeutic Level Labs: Lab Results  Component Value Date   LITHIUM 1.10 10/15/2014   No results found for: "VALPROATE" No results found for: "CBMZ"  Current Medications: Current Outpatient Medications  Medication Sig Dispense Refill   lurasidone (LATUDA) 80 MG TABS tablet Take 1 tablet (80 mg total) by mouth daily. 30 tablet 2   acetaminophen (TYLENOL) 500 MG tablet Take by mouth.     acyclovir (ZOVIRAX) 400 MG tablet TAKE (1) TABLET THREE TIMES DAILY AS NEEDED. 60 tablet 10   Adalimumab (HUMIRA PEN) 40 MG/0.4ML PNKT INJECT 40MG  SUBCUTANEOUSLY EVERY 2 WEEKS AS DIRECTED. 2 each 8   alprazolam (XANAX) 2 MG tablet Take 1 tablet (2 mg total) by mouth in the morning, at noon, in the evening, and at bedtime. 120 tablet 2   baclofen (LIORESAL) 10 MG tablet Take 10 mg by mouth 2 (two) times daily as needed.     buPROPion (WELLBUTRIN XL) 150 MG 24 hr tablet Take 1 tablet (150 mg total) by mouth every morning. 30 tablet 2   clobetasol (TEMOVATE) 0.05 % external solution Apply 1 application topically 2 (two) times daily. 50 mL 9   cycloSPORINE (RESTASIS) 0.05 % ophthalmic emulsion Place 1 drop into both eyes  2 (two) times daily.     EPINEPHrine 0.3 mg/0.3 mL IJ SOAJ injection Inject into the muscle.     FLUoxetine (PROZAC) 20 MG capsule Take 1 capsule (20 mg total) by mouth daily. 30 capsule 2   gabapentin (NEURONTIN) 600 MG tablet Take 600 mg by mouth 4 (four) times daily.     lamoTRIgine (LAMICTAL) 200 MG tablet TAKE  (1)  TABLET TWICE A DAY. 60 tablet 2   Multiple Vitamins-Minerals (MENS 50+ MULTI VITAMIN/MIN) TABS Take 1 tablet by mouth every morning.     Omega-3 Fatty Acids (COROMEGA OMEGA 3 SQUEEZE) EMUL Take by mouth.     omeprazole (PRILOSEC) 20 MG capsule Take 20 mg by mouth daily.     Potassium Gluconate 2.5 MEQ TABS Take by mouth.     Suvorexant (BELSOMRA) 20 MG TABS Take 1 tablet (20 mg total) by mouth at bedtime. 30 tablet 2   tadalafil (CIALIS) 5 MG tablet Take 5 mg by mouth daily.     VEMLIDY 25 MG TABS Take 1 tablet by mouth daily.     No current facility-administered medications for this visit.     Musculoskeletal: Strength & Muscle Tone: within normal limits Gait & Station: normal Patient leans: N/A  Psychiatric Specialty Exam: Review of Systems  Neurological:  Positive for tremors.  All other systems reviewed and are negative.   There were no vitals taken for this visit.There is no height or weight on file to calculate BMI.  General Appearance: Casual and Fairly Groomed  Eye Contact:  Good  Speech:  Clear and Coherent  Volume:  Normal  Mood:  Euthymic  Affect:  Congruent  Thought Process:  Goal Directed  Orientation:  Full (Time, Place, and Person)  Thought Content: WDL   Suicidal Thoughts:  No  Homicidal Thoughts:  No  Memory:  Immediate;   Good Recent;   Good Remote;   Good  Judgement:  Good  Insight:  Good  Psychomotor Activity:  Normal  Concentration:  Concentration: Good and Attention Span: Good  Recall:  Good  Fund of Knowledge: Good  Language: Good  Akathisia:  Yes  Handed:  Right  AIMS (if indicated): Oral buccal movements and involuntary  tongue movements  Assets:  Communication Skills Desire for Improvement Resilience Social Support Talents/Skills  ADL's:  Intact  Cognition: WNL  Sleep:  Good   Screenings: PHQ2-9    Flowsheet Row Counselor from 01/20/2022 in [facility] at [place] Video Visit from 12/22/2021 in [facility] at [place] Video Visit from 10/19/2021 in [facility] at [place] Video Visit from 09/15/2021 in [facility] Outpatient Behavioral Health at [place] Video Visit from 08/12/2021 in [facility] at [facility] Total Score 2 1 0 0 2  PHQ-9 Total Score 11 -- -- -- 7      Flowsheet Row Counselor from 01/20/2022 in [facility] at [place] ED from 01/16/2022 in [facility] Department at [facility] Video Visit from 12/22/2021 in [facility] Outpatient Behavioral Health at [place]  C-SSRS RISK CATEGORY Low Risk No Risk No Risk        Assessment and Plan: This patient is a 52 year old male with a history of bipolar disorder and anxiety.  He continues to do well on his current regimen although he seems to have been developing some tardive dyskinesia.  For now we will decrease lurasidone to 80 mg daily for mood stabilization, continue Prozac 20 mg daily for depression, Lamictal 200 mg daily for mood stabilization, and Xanax 2 mg 4 times daily for anxiety, Wellbutrin XL 150 mg daily for sexual side effects and Belsomra 20 mg at bedtime as needed for sleep.  He will return to see me in 3 months  Collaboration of Care: Collaboration of Care: Primary Care Provider AEB notes are shared with PCP on the epic system  Patient/Guardian was advised Release of Information must be obtained prior to any record release in order to collaborate their care with an outside provider. Patient/Guardian was advised if they have not already done so to contact the  registration department to sign all necessary forms in order for [place] to release information regarding their care.   Consent: Patient/Guardian gives verbal consent for treatment and assignment of benefits for services provided during this visit. Patient/Guardian expressed understanding and agreed to proceed.    [name], MD 01/24/2023, 1:53 PM

## 2023-02-06 ENCOUNTER — Telehealth (HOSPITAL_COMMUNITY): Payer: Self-pay | Admitting: *Deleted

## 2023-02-06 NOTE — Telephone Encounter (Signed)
LMOM for patient to call office to resch appt from Dec 23rd to another date due to provider being out of office.

## 2023-04-18 ENCOUNTER — Encounter (HOSPITAL_COMMUNITY): Payer: Self-pay | Admitting: Psychiatry

## 2023-04-18 ENCOUNTER — Telehealth (HOSPITAL_COMMUNITY): Payer: Medicare Other | Admitting: Psychiatry

## 2023-04-18 DIAGNOSIS — F411 Generalized anxiety disorder: Secondary | ICD-10-CM | POA: Diagnosis not present

## 2023-04-18 DIAGNOSIS — F3130 Bipolar disorder, current episode depressed, mild or moderate severity, unspecified: Secondary | ICD-10-CM | POA: Diagnosis not present

## 2023-04-18 DIAGNOSIS — F332 Major depressive disorder, recurrent severe without psychotic features: Secondary | ICD-10-CM

## 2023-04-18 MED ORDER — FLUOXETINE HCL 40 MG PO CAPS: 40.0000 mg | ORAL_CAPSULE | Freq: Every day | ORAL | 2 refills | Status: DC

## 2023-04-18 MED ORDER — ALPRAZOLAM 2 MG PO TABS: 2.0000 mg | ORAL_TABLET | Freq: Four times a day (QID) | ORAL | 2 refills | Status: DC

## 2023-04-18 MED ORDER — BUPROPION HCL ER (XL) 150 MG PO TB24: 150.0000 mg | ORAL_TABLET | ORAL | 2 refills | Status: DC

## 2023-04-18 MED ORDER — BELSOMRA 20 MG PO TABS: 20.0000 mg | ORAL_TABLET | Freq: Every day | ORAL | 2 refills | Status: DC

## 2023-04-18 MED ORDER — LURASIDONE HCL 80 MG PO TABS: 80.0000 mg | ORAL_TABLET | Freq: Every day | ORAL | 2 refills | Status: DC

## 2023-04-18 MED ORDER — LAMOTRIGINE 200 MG PO TABS: ORAL_TABLET | ORAL | 2 refills | Status: DC

## 2023-04-18 NOTE — Progress Notes (Signed)
Virtual Visit via Video Note  I connected with Roger Duran on 04/18/23 at  1:40 PM EST by a video enabled telemedicine application and verified that I am speaking with the correct person using two identifiers.  Location: Patient: home Provider: office   I discussed the limitations of evaluation and management by telemedicine and the availability of in person appointments. The patient expressed understanding and agreed to proceed.      I discussed the assessment and treatment plan with the patient. The patient was provided an opportunity to ask questions and all were answered. The patient agreed with the plan and demonstrated an understanding of the instructions.   The patient was advised to call back or seek an in-person evaluation if the symptoms worsen or if the condition fails to improve as anticipated.  I provided 20 minutes of non-face-to-face time during this encounter.   [name], MD  [facility] MD/PA/NP OP Progress Note  04/18/2023 1:58 PM Roger Duran  MRN:  [id number]  Chief Complaint:  Chief Complaint  Patient presents with   Anxiety   Depression   Follow-up   Manic Behavior   HPI: This patient is a 52 year old single white male who lives with him roommate in [place].  He is on disability for bipolar disorder.  The patient returns for follow-up after 3 months regarding his bipolar disorder.  He states he has been more depressed lately.  His father has still been drinking every night.  He and the father end up getting in arguments with the father is intoxicated and belligerent.  I have urged him to not engage with the father when he gets like this.  Also his brother is back to using drugs which has been difficult.  The patient does have a new therapist whom he relates to well.  I also suggested he try an Al-Anon group.  The patient thinks these are the main factors causing the depression but it may also be somewhat seasonal.  He has if we can increase the Prozac  and I think this is reasonable.  His mood has not been swimming a lot but he seems to be a little bit more down.  He is sleeping well and his anxiety is under good control.  He denies any thoughts of self-harm or suicide Visit Diagnosis:    ICD-10-CM   1. Bipolar I disorder, most recent episode depressed (HCC)  F31.30     2. GAD (generalized anxiety disorder)  F41.1     3. MDD (major depressive disorder), recurrent severe, without psychosis (HCC)  F33.2       Past Psychiatric History: Hospitalization in 2009 after suicide attempt, otherwise outpatient treatment  Past Medical History:  Past Medical History:  Diagnosis Date   Anxiety    Bipolar 1 disorder (HCC)    Depression    Mania (HCC)    Psoriasis     Past Surgical History:  Procedure Laterality Date   APPENDECTOMY     back procedures     COLONOSCOPY N/A 11/14/2014   Procedure: COLONOSCOPY;  Surgeon: Malissa Hippo, MD;  [facility];  Service: Endoscopy;  Laterality: N/A;  11:10 - moved to 8:30 - Ann to notify   TONSILLECTOMY     WRIST SURGERY      Family Psychiatric History: See below  Family History:  Family History  Problem Relation Age of Onset   Hyperlipidemia Mother    Heart disease Father    Anxiety disorder Father  Bipolar disorder Maternal Grandfather     Social History:  Social History   Socioeconomic History   Marital status: Single    Spouse name: Not on file   Number of children: Not on file   Years of education: Not on file   Highest education level: Not on file  Occupational History   Not on file  Tobacco Use   Smoking status: Former    Current packs/day: 0.00    Types: Cigarettes, E-cigarettes    Quit date: 11/21/2019    Years since quitting: 3.4   Smokeless tobacco: Former  Building services engineer status: Never Used  Substance and Sexual Activity   Alcohol use: No    Alcohol/week: 0.0 standard drinks of alcohol    Comment: rarely   Drug use: No    Comment: 1995-2000 PER  PT HE DID A LOT OF COCAINE AND PILLS AND DRANK A LOT   Sexual activity: Not Currently    Partners: Male    Birth control/protection: Condom  Other Topics Concern   Not on file  Social History Narrative   Not on file   Social Drivers of Health   Financial Resource Strain: Low Risk  (04/06/2023)   Received from [facility]   Overall Financial Resource Strain (CARDIA)    Difficulty of Paying Living Expenses: Not hard at all  Food Insecurity: No Food Insecurity (04/06/2023)   Received from [facility]   Hunger Vital Sign    Worried About Running Out of Food in the Last Year: Never true    Ran Out of Food in the Last Year: Never true  Transportation Needs: No Transportation Needs (04/06/2023)   Received from [facility] - Transportation    Lack of Transportation (Medical): No    Lack of Transportation (Non-Medical): No  Physical Activity: Insufficiently Active (04/06/2023)   Received from [facility]   Exercise Vital Sign    Days of Exercise per Week: 3 days    Minutes of Exercise per Session: 30 min  Stress: Stress Concern Present (04/06/2023)   Received from [facility] - Occupational Stress Questionnaire    Feeling of Stress : To some extent  Social Connections: Moderately Integrated (04/06/2023)   Received from [facility]   Social Network    How would you rate your social network (family, work, friends)?: Adequate participation with social networks    Allergies:  Allergies  Allergen Reactions   Apple Juice Itching and Swelling    Throat and mouth itching and swelling Throat and mouth itching and swelling   Bee Venom Anaphylaxis and Other (See Comments)    Weak, shaky, dizzy. Weak, shaky, dizzy.   Morphine Palpitations   Aspirin Nausea And Vomiting   Sulfa Antibiotics Nausea And Vomiting   Dicyclomine Other (See Comments)    Aka Dicyclomine Aka Dicyclomine   Other Other (See Comments)    Aka Dicyclomine    Penicillins Itching and Rash    Metabolic Disorder Labs: No results found for: "HGBA1C", "MPG" No results found for: "PROLACTIN" No results found for: "CHOL", "TRIG", "HDL", "CHOLHDL", "VLDL", "LDLCALC" No results found for: "TSH"  Therapeutic Level Labs: Lab Results  Component Value Date   LITHIUM 1.10 10/15/2014   No results found for: "VALPROATE" No results found for: "CBMZ"  Current Medications: Current Outpatient Medications  Medication Sig Dispense Refill   acetaminophen (TYLENOL) 500 MG tablet Take by mouth.     acyclovir (ZOVIRAX)  400 MG tablet TAKE (1) TABLET THREE TIMES DAILY AS NEEDED. 60 tablet 10   Adalimumab (HUMIRA PEN) 40 MG/0.4ML PNKT INJECT 40MG  SUBCUTANEOUSLY EVERY 2 WEEKS AS DIRECTED. 2 each 8   alprazolam (XANAX) 2 MG tablet Take 1 tablet (2 mg total) by mouth in the morning, at noon, in the evening, and at bedtime. 120 tablet 2   baclofen (LIORESAL) 10 MG tablet Take 10 mg by mouth 2 (two) times daily as needed.     buPROPion (WELLBUTRIN XL) 150 MG 24 hr tablet Take 1 tablet (150 mg total) by mouth every morning. 30 tablet 2   clobetasol (TEMOVATE) 0.05 % external solution Apply 1 application topically 2 (two) times daily. 50 mL 9   cycloSPORINE (RESTASIS) 0.05 % ophthalmic emulsion Place 1 drop into both eyes 2 (two) times daily.     EPINEPHrine 0.3 mg/0.3 mL IJ SOAJ injection Inject into the muscle.     FLUoxetine (PROZAC) 40 MG capsule Take 1 capsule (40 mg total) by mouth daily. 30 capsule 2   gabapentin (NEURONTIN) 600 MG tablet Take 600 mg by mouth 4 (four) times daily.     lamoTRIgine (LAMICTAL) 200 MG tablet TAKE  (1)  TABLET TWICE A DAY. 60 tablet 2   lurasidone (LATUDA) 80 MG TABS tablet Take 1 tablet (80 mg total) by mouth daily. 30 tablet 2   Multiple Vitamins-Minerals (MENS 50+ MULTI VITAMIN/MIN) TABS Take 1 tablet by mouth every morning.     Omega-3 Fatty Acids (COROMEGA OMEGA 3 SQUEEZE) EMUL Take by mouth.     omeprazole (PRILOSEC) 20 MG  capsule Take 20 mg by mouth daily.     Potassium Gluconate 2.5 MEQ TABS Take by mouth.     Suvorexant (BELSOMRA) 20 MG TABS Take 1 tablet (20 mg total) by mouth at bedtime. 30 tablet 2   tadalafil (CIALIS) 5 MG tablet Take 5 mg by mouth daily.     VEMLIDY 25 MG TABS Take 1 tablet by mouth daily.     No current facility-administered medications for this visit.     Musculoskeletal: Strength & Muscle Tone: within normal limits Gait & Station: normal Patient leans: N/A  Psychiatric Specialty Exam: Review of Systems  Neurological:  Positive for tremors.  Psychiatric/Behavioral:  Positive for dysphoric mood.   All other systems reviewed and are negative.   There were no vitals taken for this visit.There is no height or weight on file to calculate BMI.  General Appearance: Casual and Fairly Groomed  Eye Contact:  Good  Speech:  Clear and Coherent  Volume:  Normal  Mood:  Dysphoric  Affect:  Congruent  Thought Process:  Goal Directed  Orientation:  Full (Time, Place, and Person)  Thought Content: Rumination   Suicidal Thoughts:  No  Homicidal Thoughts:  No  Memory:  Immediate;   Good Recent;   Good Remote;   Good  Judgement:  Good  Insight:  Good  Psychomotor Activity:  Normal  Concentration:  Concentration: Good and Attention Span: Good  Recall:  Good  Fund of Knowledge: Good  Language: Good  Akathisia:  No  Handed:  Right  AIMS (if indicated): not done  Assets:  Communication Skills Desire for Improvement Resilience Social Support Talents/Skills  ADL's:  Intact  Cognition: WNL  Sleep:  Good   Screenings: Insurance account manager from 01/20/2022 in [facility] at [place] Video Visit from 12/22/2021 in [facility] at [place] Video Visit  from 10/19/2021 in [facility] at [place] Video Visit from 09/15/2021 in [facility] Outpatient Behavioral [facility] at [place] Video  Visit from 08/12/2021 in [facility] at [facility] Total Score 2 1 0 0 2  PHQ-9 Total Score 11 -- -- -- 7      Flowsheet Row Counselor from 01/20/2022 in [facility] at [place] ED from 01/16/2022 in [facility] Department at [facility] Video Visit from 12/22/2021 in [facility]al Health at [place]  C-SSRS RISK CATEGORY Low Risk No Risk No Risk        Assessment and Plan: This patient is a 52 year old male with a history of bipolar disorder and anxiety.  He does seem more depressed so we will increase Prozac to 40 mg daily for depression.  He will continue lurasidone 80 mg daily for mood stabilization, Lamictal 200 mg daily for mood stabilization, Xanax 2 mg 4 times daily for anxiety, Wellbutrin XL 150 mg daily for sexual side effects and Belsomra 20 mg at bedtime as needed for sleep.  He will return to see me in 3 months  Collaboration of Care: Collaboration of Care: Primary Care Provider AEB notes to be shared with PCP on the epic system  Patient/Guardian was advised Release of Information must be obtained prior to any record release in order to collaborate their care with an outside provider. Patient/Guardian was advised if they have not already done so to contact the registration department to sign all necessary forms in order for [place] to release information regarding their care.   Consent: Patient/Guardian gives verbal consent for treatment and assignment of benefits for services provided during this visit. Patient/Guardian expressed understanding and agreed to proceed.    [name], MD 04/18/2023, 1:58 PM

## 2023-04-24 ENCOUNTER — Telehealth (HOSPITAL_COMMUNITY): Payer: Medicare Other | Admitting: Psychiatry

## 2023-07-13 ENCOUNTER — Other Ambulatory Visit (HOSPITAL_COMMUNITY): Payer: Self-pay | Admitting: Psychiatry

## 2023-07-17 ENCOUNTER — Telehealth (HOSPITAL_COMMUNITY): Payer: Medicare Other | Admitting: Psychiatry

## 2023-07-17 ENCOUNTER — Encounter (HOSPITAL_COMMUNITY): Payer: Self-pay | Admitting: Psychiatry

## 2023-07-17 DIAGNOSIS — F3130 Bipolar disorder, current episode depressed, mild or moderate severity, unspecified: Secondary | ICD-10-CM

## 2023-07-17 DIAGNOSIS — F411 Generalized anxiety disorder: Secondary | ICD-10-CM | POA: Diagnosis not present

## 2023-07-17 DIAGNOSIS — F332 Major depressive disorder, recurrent severe without psychotic features: Secondary | ICD-10-CM

## 2023-07-17 MED ORDER — ALPRAZOLAM 2 MG PO TABS: 2.0000 mg | ORAL_TABLET | Freq: Four times a day (QID) | ORAL | 2 refills | Status: DC

## 2023-07-17 MED ORDER — FLUOXETINE HCL 20 MG PO CAPS: 20.0000 mg | ORAL_CAPSULE | Freq: Every day | ORAL | 2 refills | Status: DC

## 2023-07-17 MED ORDER — FLUOXETINE HCL 40 MG PO CAPS: 40.0000 mg | ORAL_CAPSULE | Freq: Every day | ORAL | 2 refills | Status: DC

## 2023-07-17 MED ORDER — BELSOMRA 20 MG PO TABS: 20.0000 mg | ORAL_TABLET | Freq: Every day | ORAL | 2 refills | Status: DC

## 2023-07-17 MED ORDER — LAMOTRIGINE 200 MG PO TABS: ORAL_TABLET | ORAL | 2 refills | Status: DC

## 2023-07-17 MED ORDER — LURASIDONE HCL 120 MG PO TABS: 120.0000 mg | ORAL_TABLET | Freq: Every day | ORAL | 2 refills | Status: DC

## 2023-07-17 MED ORDER — BUPROPION HCL ER (XL) 150 MG PO TB24: 150.0000 mg | ORAL_TABLET | ORAL | 2 refills | Status: DC

## 2023-07-17 NOTE — Progress Notes (Signed)
 Virtual Visit via Video Note  I connected with Roger Duran on 07/17/23 at  1:20 PM EDT by a video enabled telemedicine application and verified that I am speaking with the correct person using two identifiers.  Location: Patient: home Provider: office   I discussed the limitations of evaluation and management by telemedicine and the availability of in person appointments. The patient expressed understanding and agreed to proceed.     I discussed the assessment and treatment plan with the patient. The patient was provided an opportunity to ask questions and all were answered. The patient agreed with the plan and demonstrated an understanding of the instructions.   The patient was advised to call back or seek an in-person evaluation if the symptoms worsen or if the condition fails to improve as anticipated.  I provided 20 minutes of non-face-to-face time during this encounter.   [name], MD  [facility] MD/PA/NP OP Progress Note  07/17/2023 1:46 PM Roger Duran  MRN:  [id number]  Chief Complaint:  Chief Complaint  Patient presents with   Anxiety   Depression   Manic Behavior   Follow-up   HPI: This patient is a 53 year old single white male lives with his roommate in [place].  He is on disability for bipolar disorder.  The patient returns for follow-up after 3 months regarding his bipolar disorder and anxiety.  He states that he got pulled over in January by the police because he was swerving.  He states he was trying to get his roommate to a doctor because the roommate had a fever.  He had been up most of the night.  He also admits that he had smoked marijuana the night before.  He was brought into the police station and given a blood test.  He is now facing a DUI charges as well as driving violations.  He has gotten a Pharmacologist.  He is very anxious and worried about this and feels like he is getting more depressed.  He has less motivation and drive and does not  feel like doing much of anything.  He is not suicidal.  Last time we increased the Prozac but he has not seen much difference.  I suggested we go up 1 more time before giving up on it and he is in agreement.  He is taking the higher dose of lurasidone as well.  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  He is going to see his neurologist about this and discuss all of these movements.  If the neurologist think it safe we may have to add something like [place]. Visit Diagnosis:    ICD-10-CM   1. Bipolar I disorder, most recent episode depressed (HCC)  F31.30     2. GAD (generalized anxiety disorder)  F41.1     3. MDD (major depressive disorder), recurrent severe, without psychosis (HCC)  F33.2       Past Psychiatric History: Hospitalization in 2009 after a suicide attempt, otherwise outpatient treatment  Past Medical History:  Past Medical History:  Diagnosis Date   Anxiety    Bipolar 1 disorder (HCC)    Depression    Mania (HCC)    Psoriasis     Past Surgical History:  Procedure Laterality Date   APPENDECTOMY     back procedures  COLONOSCOPY N/A 11/14/2014   Procedure: COLONOSCOPY;  Surgeon: Malissa Hippo, MD;  [facility];  Service: Endoscopy;  Laterality: N/A;  11:10 - moved to 8:30 - Ann to notify   TONSILLECTOMY     WRIST SURGERY      Family Psychiatric History: See below  Family History:  Family History  Problem Relation Age of Onset   Hyperlipidemia Mother    Heart disease Father    Anxiety disorder Father    Bipolar disorder Maternal Grandfather     Social History:  Social History   Socioeconomic History   Marital status: Single    Spouse name: Not on file   Number of children: Not on file   Years of education: Not on file   Highest education level: Not on file   Occupational History   Not on file  Tobacco Use   Smoking status: Former    Current packs/day: 0.00    Types: Cigarettes, E-cigarettes    Quit date: 11/21/2019    Years since quitting: 3.6   Smokeless tobacco: Former  Building services engineer status: Never Used  Substance and Sexual Activity   Alcohol use: No    Alcohol/week: 0.0 standard drinks of alcohol    Comment: rarely   Drug use: No    Comment: 1995-2000 PER PT HE DID A LOT OF COCAINE AND PILLS AND DRANK A LOT   Sexual activity: Not Currently    Partners: Male    Birth control/protection: Condom  Other Topics Concern   Not on file  Social History Narrative   Not on file   Social Drivers of Health   Financial Resource Strain: Low Risk  (04/06/2023)   Received from [facility]   Overall Financial Resource Strain (CARDIA)    Difficulty of Paying Living Expenses: Not hard at all  Food Insecurity: No Food Insecurity (04/06/2023)   [facility] from [facility]   Hunger Vital Sign    Worried About Running Out of Food in the Last Year: Never true    Ran Out of Food in the Last Year: Never true  Transportation Needs: No Transportation Needs (04/06/2023)   Received from [facility] - Transportation    Lack of Transportation (Medical): No    Lack of Transportation (Non-Medical): No  Physical Activity: Insufficiently Active (04/06/2023)   Received from [facility]   Exercise Vital Sign    Days of Exercise per Week: 3 days    Minutes of Exercise per Session: 30 min  Stress: Stress Concern Present (04/06/2023)   Received from [facility] - Occupational Stress Questionnaire    Feeling of Stress : To some extent  Social Connections: Moderately Integrated (04/06/2023)   Received from [facility]   Social Network    How would you rate your social network (family, work, friends)?: Adequate participation with social networks    Allergies:  Allergies  Allergen Reactions    Apple Juice Itching and Swelling    Throat and mouth itching and swelling Throat and mouth itching and swelling   Bee Venom Anaphylaxis and Other (See Comments)    Weak, shaky, dizzy. Weak, shaky, dizzy.   Morphine Palpitations   Aspirin Nausea And Vomiting   Sulfa Antibiotics Nausea And Vomiting   Dicyclomine Other (See Comments)    Aka Dicyclomine Aka Dicyclomine   Other Other (See Comments)    Aka Dicyclomine   Penicillins Itching and Rash  Metabolic Disorder Labs: No results found for: "HGBA1C", "MPG" No results found for: "PROLACTIN" No results found for: "CHOL", "TRIG", "HDL", "CHOLHDL", "VLDL", "LDLCALC" No results found for: "TSH"  Therapeutic Level Labs: Lab Results  Component Value Date   LITHIUM 1.10 10/15/2014   No results found for: "VALPROATE" No results found for: "CBMZ"  Current Medications: Current Outpatient Medications  Medication Sig Dispense Refill   acetaminophen (TYLENOL) 500 MG tablet Take by mouth.     acyclovir (ZOVIRAX) 400 MG tablet TAKE (1) TABLET THREE TIMES DAILY AS NEEDED. 60 tablet 10   Adalimumab (HUMIRA PEN) 40 MG/0.4ML PNKT INJECT 40MG  SUBCUTANEOUSLY EVERY 2 WEEKS AS DIRECTED. 2 each 8   alprazolam (XANAX) 2 MG tablet Take 1 tablet (2 mg total) by mouth in the morning, at noon, in the evening, and at bedtime. 120 tablet 2   baclofen (LIORESAL) 10 MG tablet Take 10 mg by mouth 2 (two) times daily as needed.     buPROPion (WELLBUTRIN XL) 150 MG 24 hr tablet Take 1 tablet (150 mg total) by mouth every morning. 30 tablet 2   clobetasol (TEMOVATE) 0.05 % external solution Apply 1 application topically 2 (two) times daily. 50 mL 9   cycloSPORINE (RESTASIS) 0.05 % ophthalmic emulsion Place 1 drop into both eyes 2 (two) times daily.     EPINEPHrine 0.3 mg/0.3 mL IJ SOAJ injection Inject into the muscle.     FLUoxetine (PROZAC) 20 MG capsule Take 1 capsule (20 mg total) by mouth daily. 30 capsule 2   FLUoxetine (PROZAC) 40 MG capsule Take 1  capsule (40 mg total) by mouth daily. 30 capsule 2   gabapentin (NEURONTIN) 600 MG tablet Take 600 mg by mouth 4 (four) times daily.     lamoTRIgine (LAMICTAL) 200 MG tablet TAKE  (1)  TABLET TWICE A DAY. 60 tablet 2   lurasidone (LATUDA) 80 MG TABS tablet Take 1 tablet (80 mg total) by mouth daily. 30 tablet 2   Lurasidone HCl (LATUDA) 120 MG TABS Take 1 tablet (120 mg total) by mouth daily. 30 tablet 2   Multiple Vitamins-Minerals (MENS 50+ MULTI VITAMIN/MIN) TABS Take 1 tablet by mouth every morning.     Omega-3 Fatty Acids (COROMEGA OMEGA 3 SQUEEZE) EMUL Take by mouth.     omeprazole (PRILOSEC) 20 MG capsule Take 20 mg by mouth daily.     Potassium Gluconate 2.5 MEQ TABS Take by mouth.     Suvorexant (BELSOMRA) 20 MG TABS Take 1 tablet (20 mg total) by mouth at bedtime. 30 tablet 2   tadalafil (CIALIS) 5 MG tablet Take 5 mg by mouth daily.     VEMLIDY 25 MG TABS Take 1 tablet by mouth daily.     No current facility-administered medications for this visit.     Musculoskeletal: Strength & Muscle Tone: within normal limits Gait & Station: normal Patient leans: N/A  Psychiatric Specialty Exam: Review of Systems  Neurological:  Positive for tremors and headaches.  Psychiatric/Behavioral:  Positive for dysphoric mood. The patient is nervous/anxious.   All other systems reviewed and are negative.   There were no vitals taken for this visit.There is no height or weight on file to calculate BMI.  General Appearance: Casual and Fairly Groomed  Eye Contact:  Good  Speech:  Clear and Coherent  Volume:  Normal  Mood:  Anxious and Depressed  Affect:  Congruent  Thought Process:  Goal Directed  Orientation:  Full (Time, Place, and Person)  Thought Content: Rumination  Suicidal Thoughts:  No  Homicidal Thoughts:  No  Memory:  Immediate;   Good Recent;   Good Remote;   Good  Judgement:  Good  Insight:  Good  Psychomotor Activity:  TD and Tremor  Concentration:  Concentration: Good  and Attention Span: Good  Recall:  Good  Fund of Knowledge: Good  Language: Good  Akathisia:  No  Handed:  Right  AIMS (if indicated): not done  Assets:  Communication Skills Desire for Improvement Resilience Social Support  ADL's:  Intact  Cognition: WNL  Sleep:  Good   Screenings: PHQ2-9    Flowsheet Row Counselor from 01/20/2022 in [facility] at [place] Video Visit from 12/22/2021 in [facility] Outpatient Behavioral [facility] at [place] Video Visit from 10/19/2021 in [facility] Outpatient Behavioral Health at [place] Video Visit from 09/15/2021 in [facility] Health Outpatient Behavioral Health at [place] Video Visit from 08/12/2021 in [facility] Outpatient Behavioral Health at [facility] Total Score 2 1 0 0 2  PHQ-9 Total Score 11 -- -- -- 7      Flowsheet Row Counselor from 01/20/2022 in [place] [facility] at [place] ED from 01/16/2022 in [facility] Emergency Department at [facility] Video Visit from 12/22/2021 in [facility] Outpatient Behavioral Health at [place]  C-SSRS RISK CATEGORY Low Risk No Risk No Risk        Assessment and Plan: This patient is a 53 year old male with a history of bipolar disorder and anxiety.  Since his depression is worsening we will increase Prozac 1 more time to 60 mg daily for depression.  He will continue lurasidone 120 mg daily for mood stabilization, Lamictal 200 mg daily for mood stabilization, Xanax 2 mg 4 times daily for anxiety, Wellbutrin XL 150 mg daily for sexual side effects and Belsomra 20 mg at bedtime as needed for sleep.  He states that he is no longer using marijuana and has not used it since January.  He will return to see me in 4 weeks.  Collaboration of Care: Collaboration of Care: Primary Care Provider AEB notes will be shared with PCP on the epic system  Patient/Guardian was advised Release of Information must be obtained prior to any record release in  order to collaborate their care with an outside provider. Patient/Guardian was advised if they have not already done so to contact the registration department to sign all necessary forms in order for [place] to release information regarding their care.   Consent: Patient/Guardian gives verbal consent for treatment and assignment of benefits for services provided during this visit. Patient/Guardian expressed understanding and agreed to proceed.    [name], MD 07/17/2023, 1:46 PM

## 2023-08-24 ENCOUNTER — Telehealth (HOSPITAL_COMMUNITY): Admitting: Psychiatry

## 2023-08-24 ENCOUNTER — Encounter (HOSPITAL_COMMUNITY): Payer: Self-pay | Admitting: Psychiatry

## 2023-08-24 DIAGNOSIS — F332 Major depressive disorder, recurrent severe without psychotic features: Secondary | ICD-10-CM

## 2023-08-24 DIAGNOSIS — F411 Generalized anxiety disorder: Secondary | ICD-10-CM | POA: Diagnosis not present

## 2023-08-24 DIAGNOSIS — F3130 Bipolar disorder, current episode depressed, mild or moderate severity, unspecified: Secondary | ICD-10-CM | POA: Diagnosis not present

## 2023-08-24 MED ORDER — BUPROPION HCL ER (XL) 150 MG PO TB24: 150.0000 mg | ORAL_TABLET | ORAL | 2 refills | Status: DC

## 2023-08-24 MED ORDER — LURASIDONE HCL 120 MG PO TABS: 120.0000 mg | ORAL_TABLET | Freq: Every day | ORAL | 2 refills | Status: DC

## 2023-08-24 MED ORDER — LAMOTRIGINE 200 MG PO TABS
ORAL_TABLET | ORAL | 2 refills | Status: DC
Start: 2023-08-24 — End: 2023-11-21

## 2023-08-24 MED ORDER — FLUOXETINE HCL 20 MG PO CAPS: 20.0000 mg | ORAL_CAPSULE | Freq: Every day | ORAL | 2 refills | Status: DC

## 2023-08-24 MED ORDER — ALPRAZOLAM 2 MG PO TABS: 2.0000 mg | ORAL_TABLET | Freq: Four times a day (QID) | ORAL | 2 refills | Status: DC

## 2023-08-24 MED ORDER — BELSOMRA 20 MG PO TABS: 20.0000 mg | ORAL_TABLET | Freq: Every day | ORAL | 2 refills | Status: DC

## 2023-08-24 MED ORDER — FLUOXETINE HCL 40 MG PO CAPS: 40.0000 mg | ORAL_CAPSULE | Freq: Every day | ORAL | 2 refills | Status: DC

## 2023-08-24 NOTE — Progress Notes (Signed)
 Virtual Visit via Video Note  I connected with Roger Duran on 08/24/23 at  1:40 PM EDT by a video enabled telemedicine application and verified that I am speaking with the correct person using two identifiers.  Location: Patient: home Provider: office   I discussed the limitations of evaluation and management by telemedicine and the availability of in person appointments. The patient expressed understanding and agreed to proceed.      I discussed the assessment and treatment plan with the patient. The patient was provided an opportunity to ask questions and all were answered. The patient agreed with the plan and demonstrated an understanding of the instructions.   The patient was advised to call back or seek an in-person evaluation if the symptoms worsen or if the condition fails to improve as anticipated.  I provided 20 minutes of non-face-to-face time during this encounter.   [name], MD  [facility] MD/PA/NP OP Progress Note  08/24/2023 1:49 PM Roger Duran  MRN:  [id number]  Chief Complaint:  Chief Complaint  Patient presents with   Anxiety   Depression   Manic Behavior   Follow-up  This patient is a 53 year old single white male lives with his roommate in [place]. He is on disability for bipolar disorder.   The patient returns for follow-up after 4 weeks regarding his bipolar disorder and anxiety.  We again discussed the fact that he got pulled over in January when he went to get his roommate because the police thought he was intoxicated.  He stated that he had smoked marijuana the night before but had not gotten much sleep.  He now wants a copy of his medication list and I think this is reasonable.  Since this time he is has less motivation but is starting to feel better since we increased his Prozac  last time.  He still having some abnormal movements in his trunk and sometimes chewing his tongue but they have decreased.  He is going to speak to neurology about it.   He does have basal ganglia and implants for tremor but still has some tremor in his arms and hands.  Overall however his mood is stable he is sleeping well and he denies thoughts of suicide or self-harm.  He denies manic symptoms racing thoughts irritability or anger.   Visit Diagnosis:    ICD-10-CM   1. Bipolar I disorder, most recent episode depressed (HCC)  F31.30     2. GAD (generalized anxiety disorder)  F41.1     3. MDD (major depressive disorder), recurrent severe, without psychosis (HCC)  F33.2       Past Psychiatric History: Hospitalization in 2009 after a suicide attempt, otherwise outpatient treatment  Past Medical History:  Past Medical History:  Diagnosis Date   Anxiety    Bipolar 1 disorder (HCC)    Depression    Mania (HCC)    Psoriasis     Past Surgical History:  Procedure Laterality Date   APPENDECTOMY     back procedures     COLONOSCOPY N/A 11/14/2014   Procedure: COLONOSCOPY;  Surgeon: Ruby Corporal, MD;  [facility];  Service: Endoscopy;  Laterality: N/A;  11:10 - moved to 8:30 - Ann to notify   TONSILLECTOMY     WRIST SURGERY      Family Psychiatric History: See below  Family History:  Family History  Problem Relation Age of Onset   Hyperlipidemia Mother    Heart disease Father    Anxiety disorder Father  Bipolar disorder Maternal Grandfather     Social History:  Social History   Socioeconomic History   Marital status: Single    Spouse name: Not on file   Number of children: Not on file   Years of education: Not on file   Highest education level: Not on file  Occupational History   Not on file  Tobacco Use   Smoking status: Former    Current packs/day: 0.00    Types: Cigarettes, E-cigarettes    Quit date: 11/21/2019    Years since quitting: 3.7   Smokeless tobacco: Former  Building services engineer status: Never Used  Substance and Sexual Activity   Alcohol use: No    Alcohol/week: 0.0 standard drinks of alcohol     Comment: rarely   Drug use: No    Comment: 1995-2000 PER PT HE DID A LOT OF COCAINE AND PILLS AND DRANK A LOT   Sexual activity: Not Currently    Partners: Male    Birth control/protection: Condom  Other Topics Concern   Not on file  Social History Narrative   Not on file   Social Drivers of Health   Financial Resource Strain: Low Risk  (07/25/2023)   Received from [facility]   Overall Financial Resource Strain (CARDIA)    Difficulty of Paying Living Expenses: Not very hard  Food Insecurity: Food Insecurity Present (07/25/2023)   Received from [facility]   Hunger Vital Sign    Worried About Running Out of Food in the Last Year: Never true    Ran Out of Food in the Last Year: Sometimes true  Transportation Needs: No Transportation Needs (07/25/2023)   Received from [facility] - Transportation    Lack of Transportation (Medical): No    Lack of Transportation (Non-Medical): No  Physical Activity: Insufficiently Active (07/25/2023)   Received from [facility]   Exercise Vital Sign    Days of Exercise per Week: 1 day    Minutes of Exercise per Session: 20 min  Stress: Stress Concern Present (07/25/2023)   Received from [facility] - Occupational Stress Questionnaire    Feeling of Stress : To some extent  Social Connections: Somewhat Isolated (07/25/2023)   Received from [facility]   Social Network    How would you rate your social network (family, work, friends)?: Restricted participation with some degree of social isolation    Allergies:  Allergies  Allergen Reactions   Apple Juice Itching and Swelling    Throat and mouth itching and swelling Throat and mouth itching and swelling   Bee Venom Anaphylaxis and Other (See Comments)    Weak, shaky, dizzy. Weak, shaky, dizzy.   Morphine Palpitations   Aspirin Nausea And Vomiting   Sulfa Antibiotics Nausea And Vomiting   Dicyclomine  Other (See Comments)    Aka  Dicyclomine  Aka Dicyclomine    Other Other (See Comments)    Aka Dicyclomine    Penicillins Itching and Rash    Metabolic Disorder Labs: No results found for: "HGBA1C", "MPG" No results found for: "PROLACTIN" No results found for: "CHOL", "TRIG", "HDL", "CHOLHDL", "VLDL", "LDLCALC" No results found for: "TSH"  Therapeutic Level Labs: Lab Results  Component Value Date   LITHIUM  1.10 10/15/2014   No results found for: "VALPROATE" No results found for: "CBMZ"  Current Medications: Current Outpatient Medications  Medication Sig Dispense Refill   acetaminophen  (TYLENOL ) 500 MG tablet Take by mouth.  acyclovir  (ZOVIRAX ) 400 MG tablet TAKE (1) TABLET THREE TIMES DAILY AS NEEDED. 60 tablet 10   Adalimumab  (HUMIRA  PEN) 40 MG/0.4ML PNKT INJECT 40MG  SUBCUTANEOUSLY EVERY 2 WEEKS AS DIRECTED. 2 each 8   alprazolam  (XANAX ) 2 MG tablet Take 1 tablet (2 mg total) by mouth in the morning, at noon, in the evening, and at bedtime. 120 tablet 2   baclofen (LIORESAL) 10 MG tablet Take 10 mg by mouth 2 (two) times daily as needed.     buPROPion  (WELLBUTRIN  XL) 150 MG 24 hr tablet Take 1 tablet (150 mg total) by mouth every morning. 30 tablet 2   clobetasol  (TEMOVATE ) 0.05 % external solution Apply 1 application topically 2 (two) times daily. 50 mL 9   cycloSPORINE  (RESTASIS ) 0.05 % ophthalmic emulsion Place 1 drop into both eyes 2 (two) times daily.     EPINEPHrine 0.3 mg/0.3 mL IJ SOAJ injection Inject into the muscle.     FLUoxetine  (PROZAC ) 20 MG capsule Take 1 capsule (20 mg total) by mouth daily. 30 capsule 2   FLUoxetine  (PROZAC ) 40 MG capsule Take 1 capsule (40 mg total) by mouth daily. 30 capsule 2   gabapentin  (NEURONTIN ) 600 MG tablet Take 600 mg by mouth 4 (four) times daily.     lamoTRIgine  (LAMICTAL ) 200 MG tablet TAKE  (1)  TABLET TWICE A DAY. 60 tablet 2   Lurasidone  HCl (LATUDA ) 120 MG TABS Take 1 tablet (120 mg total) by mouth daily. 30 tablet 2   Multiple Vitamins-Minerals (MENS  50+ MULTI VITAMIN/MIN) TABS Take 1 tablet by mouth every morning.     Omega-3 Fatty Acids (COROMEGA OMEGA 3 SQUEEZE) EMUL Take by mouth.     omeprazole (PRILOSEC) 20 MG capsule Take 20 mg by mouth daily.     Potassium Gluconate 2.5 MEQ TABS Take by mouth.     Suvorexant  (BELSOMRA ) 20 MG TABS Take 1 tablet (20 mg total) by mouth at bedtime. 30 tablet 2   tadalafil (CIALIS) 5 MG tablet Take 5 mg by mouth daily.     VEMLIDY 25 MG TABS Take 1 tablet by mouth daily.     No current facility-administered medications for this visit.     Musculoskeletal: Strength & Muscle Tone: within normal limits Gait & Station: normal Patient leans: N/A  Psychiatric Specialty Exam: Review of Systems  Musculoskeletal:  Positive for neck pain.  Neurological:  Positive for tremors and headaches.  All other systems reviewed and are negative.   There were no vitals taken for this visit.There is no height or weight on file to calculate BMI.  General Appearance: Casual and Fairly Groomed  Eye Contact:  Good  Speech:  Clear and Coherent  Volume:  Normal  Mood:  Euthymic  Affect:  Congruent  Thought Process:  Goal Directed  Orientation:  Full (Time, Place, and Person)  Thought Content: WDL   Suicidal Thoughts:  No  Homicidal Thoughts:  No  Memory:  Immediate;   Good Recent;   Good Remote;   NA  Judgement:  Good  Insight:  Fair  Psychomotor Activity:  TD and Tremor  Concentration:  Concentration: Good and Attention Span: Good  Recall:  Good  Fund of Knowledge: Good  Language: Good  Akathisia:  No  Handed:  Right  AIMS (if indicated): not done  Assets:  Communication Skills Desire for Improvement Resilience Social Support Talents/Skills  ADL's:  Intact  Cognition: WNL  Sleep:  Good   Screenings: Insurance account manager  from 01/20/2022 in [facility]patient Behavioral Health at [place] Video Visit from 12/22/2021 in [facility] Outpatient Behavioral Health at [place] Video  Visit from 10/19/2021 in [facility] Outpatient Behavioral Health at [place] Video Visit from 09/15/2021 in [facility] Outpatient Behavioral Health at [place] Video Visit from 08/12/2021 in [facility] Out[facility] at [facility] Total Score 2 1 0 0 2  PHQ-9 Total Score 11 -- -- -- 7      Flowsheet Row Counselor from 01/20/2022 in [facility] Health at [place] ED from 01/16/2022 in [facility] Emergency Department at [facility] Video Visit from 12/22/2021 in [facility] Outpatient Behavioral Health at [place]  C-SSRS RISK CATEGORY Low Risk No Risk No Risk        Assessment and Plan: This patient is a 53 year old male with a history of bipolar disorder and anxiety.  For now he is doing well on his current regimen.  He will continue Prozac  60 mg daily for depression, lurasidone  120 mg daily for mood stabilization, Lamictal  200 mg daily for mood stabilization, Xanax  2 mg 4 times daily for anxiety, Wellbutrin  XL 150 mg daily for side effects and Belsomra  20 mg at bedtime as needed for sleep.  He will return to see me in 3 months  Collaboration of Care: Collaboration of Care: Primary Care Provider AEB notes are shared with PCP on the epic system  Patient/Guardian was advised Release of Information must be obtained prior to any record release in order to collaborate their care with an outside provider. Patient/Guardian was advised if they have not already done so to contact the registration department to sign all necessary forms in order for us  to release information regarding their care.   Consent: Patient/Guardian gives verbal consent for treatment and assignment of benefits for services provided during this visit. Patient/Guardian expressed understanding and agreed to proceed.    [name], MD 08/24/2023, 1:49 PM

## 2023-11-21 ENCOUNTER — Encounter (HOSPITAL_COMMUNITY): Payer: Self-pay | Admitting: Psychiatry

## 2023-11-21 ENCOUNTER — Telehealth (INDEPENDENT_AMBULATORY_CARE_PROVIDER_SITE_OTHER): Admitting: Psychiatry

## 2023-11-21 DIAGNOSIS — F411 Generalized anxiety disorder: Secondary | ICD-10-CM

## 2023-11-21 DIAGNOSIS — F3130 Bipolar disorder, current episode depressed, mild or moderate severity, unspecified: Secondary | ICD-10-CM

## 2023-11-21 DIAGNOSIS — F332 Major depressive disorder, recurrent severe without psychotic features: Secondary | ICD-10-CM

## 2023-11-21 MED ORDER — FLUOXETINE HCL 20 MG PO CAPS: 20.0000 mg | ORAL_CAPSULE | Freq: Every day | ORAL | 2 refills | Status: DC

## 2023-11-21 MED ORDER — ALPRAZOLAM 2 MG PO TABS: 2.0000 mg | ORAL_TABLET | Freq: Four times a day (QID) | ORAL | 2 refills | Status: DC

## 2023-11-21 MED ORDER — BELSOMRA 20 MG PO TABS: 20.0000 mg | ORAL_TABLET | Freq: Every day | ORAL | 2 refills | Status: DC

## 2023-11-21 MED ORDER — BUPROPION HCL ER (XL) 150 MG PO TB24: 150.0000 mg | ORAL_TABLET | ORAL | 2 refills | Status: DC

## 2023-11-21 MED ORDER — FLUOXETINE HCL 40 MG PO CAPS: 40.0000 mg | ORAL_CAPSULE | Freq: Every day | ORAL | 2 refills | Status: DC

## 2023-11-21 MED ORDER — LAMOTRIGINE 200 MG PO TABS: ORAL_TABLET | ORAL | 2 refills | Status: DC

## 2023-11-21 MED ORDER — LURASIDONE HCL 120 MG PO TABS: 120.0000 mg | ORAL_TABLET | Freq: Every day | ORAL | 2 refills | Status: DC

## 2023-11-21 NOTE — Progress Notes (Signed)
 Virtual Visit via Telephone Note  I connected with Roger Duran on 11/21/23 at  9:00 AM EDT by telephone and verified that I am speaking with the correct person using two identifiers.  Location: Patient: home Provider: office   I discussed the limitations, risks, security and privacy concerns of performing an evaluation and management service by telephone and the availability of in person appointments. I also discussed with the patient that there may be a patient responsible charge related to this service. The patient expressed understanding and agreed to proceed.       I discussed the assessment and treatment plan with the patient. The patient was provided an opportunity to ask questions and all were answered. The patient agreed with the plan and demonstrated an understanding of the instructions.   The patient was advised to call back or seek an in-person evaluation if the symptoms worsen or if the condition fails to improve as anticipated.  I provided 20 minutes of non-face-to-face time during this encounter.   [name], MD  [facility] MD/PA/NP OP Progress Note  11/21/2023 9:16 AM Roger Duran  MRN:  [id number]  Chief Complaint:  Chief Complaint  Patient presents with   Anxiety   Depression   Follow-up   Manic Behavior   HPI: This patient is a 53 year old single white male who lives with his roommate in [place].  He is on disability for bipolar disorder.  The patient returns for follow-up after 3 months regarding his bipolar disorder and anxiety.  He states that recently he has been chewing on his tongue even more.  He mentioned this last time.  I explained as I did last time that he would need to come in person so I can see this and decide what we are going to do.  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  However this does not seem to have any effect on the chewing motion which is probably  tardive dyskinesia.  He is on lurasidone  120 mg and when we tried lowering it he has gotten more depressed.  He does feel like his medications have kept his mood stable.  He is sleeping well most of the time and does not need to use the Belsomra  every night.  He denies significant depression thoughts of self-harm or suicide Visit Diagnosis:    ICD-10-CM   1. Bipolar I disorder, most recent episode depressed (HCC)  F31.30     2. GAD (generalized anxiety disorder)  F41.1     3. MDD (major depressive disorder), recurrent severe, without psychosis (HCC)  F33.2       Past Psychiatric History: Hospitalization in 2009 after a suicide attempt, otherwise outpatient treatment  Past Medical History:  Past Medical History:  Diagnosis Date   Anxiety    Bipolar 1 disorder (HCC)    Depression    Mania (HCC)    Psoriasis     Past Surgical History:  Procedure Laterality Date   APPENDECTOMY     back procedures     COLONOSCOPY N/A 11/14/2014   Procedure: COLONOSCOPY;  Surgeon: Claudis RAYMOND Rivet, MD;  [facility];  Service: Endoscopy;  Laterality: N/A;  11:10 - moved to 8:30 - Ann to notify   TONSILLECTOMY     WRIST SURGERY      Family Psychiatric History: See below  Family History:  Family History  Problem Relation Age of Onset  Hyperlipidemia Mother    Heart disease Father    Anxiety disorder Father    Bipolar disorder Maternal Grandfather     Social History:  Social History   Socioeconomic History   Marital status: Single    Spouse name: Not on file   Number of children: Not on file   Years of education: Not on file   Highest education level: Not on file  Occupational History   Not on file  Tobacco Use   Smoking status: Former    Current packs/day: 0.00    Types: Cigarettes, E-cigarettes    Quit date: 11/21/2019    Years since quitting: 4.0   Smokeless tobacco: Former  Building services engineer status: Never Used  Substance and Sexual Activity   Alcohol use: No     Alcohol/week: 0.0 standard drinks of alcohol    Comment: rarely   Drug use: No    Comment: 1995-2000 PER PT HE DID A LOT OF COCAINE AND PILLS AND DRANK A LOT   Sexual activity: Not Currently    Partners: Male    Birth control/protection: Condom  Other Topics Concern   Not on file  Social History Narrative   Not on file   Social Drivers of Health   Financial Resource Strain: Low Risk  (07/25/2023)   Received from [facility]   Overall Financial Resource Strain (CARDIA)    Difficulty of Paying Living Expenses: Not very hard  Food Insecurity: Food Insecurity Present (07/25/2023)   Received from [facility]   Hunger Vital Sign    Within the past 12 months, you worried that your food would run out before you got the money to buy more.: Never true    Within the past 12 months, the food you bought just didn't last and you didn't have money to get more.: Sometimes true  Transportation Needs: No Transportation Needs (07/25/2023)   Received from [facility] - Transportation    Lack of Transportation (Medical): No    Lack of Transportation (Non-Medical): No  Physical Activity: Insufficiently Active (07/25/2023)   Received from [facility]   Exercise Vital Sign    On average, how many days per week do you engage in moderate to strenuous exercise (like a brisk walk)?: 1 day    On average, how many minutes do you engage in exercise at this level?: 20 min  Stress: Stress Concern Present (07/25/2023)   Received from [facility] - Occupational Stress Questionnaire    Feeling of Stress : To some extent  Social Connections: Somewhat Isolated (07/25/2023)   Received [facility]   Social Network    How would you rate your social network (family, work, friends)?: Restricted participation with some degree of social isolation    Allergies:  Allergies  Allergen Reactions   Apple Juice Itching and Swelling    Throat and mouth  itching and swelling Throat and mouth itching and swelling   Bee Venom Anaphylaxis and Other (See Comments)    Weak, shaky, dizzy. Weak, shaky, dizzy.   Morphine Palpitations   Aspirin Nausea And Vomiting   Sulfa Antibiotics Nausea And Vomiting   Dicyclomine  Other (See Comments)    Aka Dicyclomine  Aka Dicyclomine    Other Other (See Comments)    Aka Dicyclomine    Penicillins Itching and Rash    Metabolic Disorder Labs: No results found for: HGBA1C, MPG No results found for: PROLACTIN No results found for: CHOL,  TRIG, HDL, CHOLHDL, VLDL, LDLCALC No results found for: TSH  Therapeutic Level Labs: Lab Results  Component Value Date   LITHIUM  1.10 10/15/2014   No results found for: VALPROATE No results found for: CBMZ  Current Medications: Current Outpatient Medications  Medication Sig Dispense Refill   acetaminophen  (TYLENOL ) 500 MG tablet Take by mouth.     acyclovir  (ZOVIRAX ) 400 MG tablet TAKE (1) TABLET THREE TIMES DAILY AS NEEDED. 60 tablet 10   Adalimumab  (HUMIRA  PEN) 40 MG/0.4ML PNKT INJECT 40MG  SUBCUTANEOUSLY EVERY 2 WEEKS AS DIRECTED. 2 each 8   alprazolam  (XANAX ) 2 MG tablet Take 1 tablet (2 mg total) by mouth in the morning, at noon, in the evening, and at bedtime. 120 tablet 2   baclofen (LIORESAL) 10 MG tablet Take 10 mg by mouth 2 (two) times daily as needed.     buPROPion  (WELLBUTRIN  XL) 150 MG 24 hr tablet Take 1 tablet (150 mg total) by mouth every morning. 30 tablet 2   clobetasol  (TEMOVATE ) 0.05 % external solution Apply 1 application topically 2 (two) times daily. 50 mL 9   cycloSPORINE  (RESTASIS ) 0.05 % ophthalmic emulsion Place 1 drop into both eyes 2 (two) times daily.     EPINEPHrine 0.3 mg/0.3 mL IJ SOAJ injection Inject into the muscle.     FLUoxetine  (PROZAC ) 20 MG capsule Take 1 capsule (20 mg total) by mouth daily. 30 capsule 2   FLUoxetine  (PROZAC ) 40 MG capsule Take 1 capsule (40 mg total) by mouth daily. 30 capsule 2    gabapentin  (NEURONTIN ) 600 MG tablet Take 600 mg by mouth 4 (four) times daily.     lamoTRIgine  (LAMICTAL ) 200 MG tablet TAKE  (1)  TABLET TWICE A DAY. 60 tablet 2   Lurasidone  HCl (LATUDA ) 120 MG TABS Take 1 tablet (120 mg total) by mouth daily. 30 tablet 2   Multiple Vitamins-Minerals (MENS 50+ MULTI VITAMIN/MIN) TABS Take 1 tablet by mouth every morning.     Omega-3 Fatty Acids (COROMEGA OMEGA 3 SQUEEZE) EMUL Take by mouth.     omeprazole (PRILOSEC) 20 MG capsule Take 20 mg by mouth daily.     Potassium Gluconate 2.5 MEQ TABS Take by mouth.     Suvorexant  (BELSOMRA ) 20 MG TABS Take 1 tablet (20 mg total) by mouth at bedtime. 30 tablet 2   tadalafil (CIALIS) 5 MG tablet Take 5 mg by mouth daily.     VEMLIDY 25 MG TABS Take 1 tablet by mouth daily.     No current facility-administered medications for this visit.     Musculoskeletal: Strength & Muscle Tone: na Gait & Station: na Patient leans: N/A  Psychiatric Specialty Exam: Review of Systems  Neurological:  Positive for tremors.       Involuntary mouth movements by his report  All other systems reviewed and are negative.   There were no vitals taken for this visit.There is no height or weight on file to calculate BMI.  General Appearance: NA  Eye Contact:  NA  Speech:  Clear and Coherent  Volume:  Normal  Mood:  Euthymic  Affect:  NA  Thought Process:  Goal Directed  Orientation:  Full (Time, Place, and Person)  Thought Content: WDL   Suicidal Thoughts:  No  Homicidal Thoughts:  No  Memory:  Immediate;   Good Recent;   Good Remote;   NA  Judgement:  Good  Insight:  Good  Psychomotor Activity:  Tremor  Concentration:  Concentration: Good and Attention Span: Good  Recall:  Good  Fund of Knowledge: Good  Language: Good  Akathisia:  No  Handed:  Right  AIMS (if indicated): not done  Assets:  Communication Skills Desire for Improvement Physical Health Resilience Social Support  ADL's:  Intact  Cognition: WNL   Sleep:  Good   Screenings: PHQ2-9    Flowsheet Row Counselor from 01/20/2022 in [facility] at [place] Video Visit from 12/22/2021 in [facility] at [place] Video Visit from 10/19/2021 in [facility] at [place] Video Visit from 09/15/2021 in [facility] at [place] Video Visit from 08/12/2021 in [facility] Total Score 2 1 0 0 2  PHQ-9 Total Score 11 -- -- -- 7   Flowsheet Row Counselor from 01/20/2022 in [facility] at [place] ED from 01/16/2022 in [facility] Emergency Department at [facility] Video Visit from 12/22/2021 in [facility] at [place]  C-SSRS RISK CATEGORY Low Risk No Risk No Risk     Assessment and Plan: This patient is a 53 year old male with a history of bipolar disorder and anxiety.  He describes what sounds like tardive dyskinesia but I will need to see this in person.  For now he will continue his current regimen.  He will continue Prozac  60 mg daily for depression, lurasidone  120 mg as well as Lamictal  200 mg daily for mood stabilization, Xanax  2 mg 4 times daily for anxiety, Wellbutrin  XL 150 mg daily for sexual side effects and Belsomra  20 mg at bedtime as needed for sleep.  He will return to see me in the next several weeks  in person.  Collaboration of Care: Collaboration of Care: Primary Care Provider AEB notes are shared with PCP on the epic system  Patient/Guardian was advised Release of Information must be obtained prior to any record release in order to collaborate their care with an outside provider. Patient/Guardian was advised if they have not already done so to contact the registration department to sign all necessary forms in order for us  to release information regarding their care.   Consent: Patient/Guardian gives verbal  consent for treatment and assignment of benefits for services provided during this visit. Patient/Guardian expressed understanding and agreed to proceed.    [name], MD 11/21/2023, 9:16 AM

## 2023-11-27 ENCOUNTER — Ambulatory Visit (HOSPITAL_COMMUNITY): Admitting: Psychiatry

## 2023-11-27 ENCOUNTER — Encounter (HOSPITAL_COMMUNITY): Payer: Self-pay | Admitting: Psychiatry

## 2023-11-27 VITALS — BP 133/80 | HR 79 | Temp 97.7°F | Ht 65.0 in | Wt 162.0 lb

## 2023-11-27 DIAGNOSIS — F332 Major depressive disorder, recurrent severe without psychotic features: Secondary | ICD-10-CM

## 2023-11-27 DIAGNOSIS — F3130 Bipolar disorder, current episode depressed, mild or moderate severity, unspecified: Secondary | ICD-10-CM | POA: Diagnosis not present

## 2023-11-27 DIAGNOSIS — F411 Generalized anxiety disorder: Secondary | ICD-10-CM

## 2023-11-27 DIAGNOSIS — G2401 Drug induced subacute dyskinesia: Secondary | ICD-10-CM

## 2023-11-27 MED ORDER — VALBENAZINE TOSYLATE 40 MG PO CAPS: 40.0000 mg | ORAL_CAPSULE | Freq: Every day | ORAL | 2 refills | Status: DC

## 2023-11-27 NOTE — Progress Notes (Signed)
 [facility] MD/PA/NP OP Progress Note  11/27/2023 4:45 PM Roger Duran  MRN:  [id number]  Chief Complaint:  Chief Complaint  Patient presents with   Anxiety   Depression   Follow-up   Manic Behavior   HPI: This patient is a 53 year old single white male lives with his roommate in [place].  He is on disability for bipolar disorder.  The patient returns for follow-up after 1 week.  He was seen last week on a phone visit but he was complaining about abnormal movements in his hands and legs as well as abnormal tongue and mouth movements.  I asked that he come in person so I could see these.  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  However when this gets adjusted it does not help months some of these other movements.  He is definitely moving his arms and hands his hands are shaking.  He is restless and moving his legs.  He is moving his mouth as well as his tongue.  His aims score today was 19.  He is on lurasidone  120 mg in does not want to change it because it has helped his mood considerably.  I suggested that we try something for tardive dyskinesia such as Ingrezza  and he is in agreement. Visit Diagnosis:    ICD-10-CM   1. Bipolar I disorder, most recent episode depressed (HCC)  F31.30     2. GAD (generalized anxiety disorder)  F41.1     3. MDD (major depressive disorder), recurrent severe, without psychosis (HCC)  F33.2     4. Tardive dyskinesia  G24.01       Past Psychiatric History: Hospitalization in 2009 after a suicide attempt, otherwise outpatient treatment  Past Medical History:  Past Medical History:  Diagnosis Date   Anxiety    Bipolar 1 disorder (HCC)    Depression    Mania (HCC)    Psoriasis     Past Surgical History:  Procedure Laterality Date   APPENDECTOMY     back procedures     COLONOSCOPY N/A 11/14/2014   Procedure: COLONOSCOPY;  Surgeon: Claudis RAYMOND Rivet, MD;  [facility];   Service: Endoscopy;  Laterality: N/A;  11:10 - moved to 8:30 - Ann to notify   TONSILLECTOMY     WRIST SURGERY      Family Psychiatric History: See below  Family History:  Family History  Problem Relation Age of Onset   Hyperlipidemia Mother    Heart disease Father    Anxiety disorder Father    Bipolar disorder Maternal Grandfather     Social History:  Social History   Socioeconomic History   Marital status: Single    Spouse name: Not on file   Number of children: Not on file   Years of education: Not on file   Highest education level: Not on file  Occupational History   Not on file  Tobacco Use   Smoking status: Former    Current packs/day: 0.00    Types: Cigarettes, E-cigarettes    Quit date: 11/21/2019    Years since quitting: 4.0   Smokeless tobacco: Former  Building services engineer status: Never Used  Substance and Sexual Activity   Alcohol use: No    Alcohol/week: 0.0 standard drinks of alcohol    Comment: rarely   Drug use: Yes    Types: Marijuana    Comment: 1995-2000 PER PT  HE DID A LOT OF COCAINE AND PILLS AND DRANK A LOT   Sexual activity: Not Currently    Partners: Male    Birth control/protection: Condom  Other Topics Concern   Not on file  Social History Narrative   Not on file   Social Drivers of Health   Financial Resource Strain: Low Risk  (07/25/2023)   Received from [facility]   Overall Financial Resource Strain (CARDIA)    Difficulty of Paying Living Expenses: Not very hard  Food Insecurity: Food Insecurity Present (07/25/2023)   Received from [facility]   Hunger Vital Sign    Within the past 12 months, you worried that your food would run out before you got the money to buy more.: Never true    Within the past 12 months, the food you bought just didn't last and you didn't have money to get more.: Sometimes true  Transportation Needs: No Transportation Needs (07/25/2023)   Received from [facility] - Transportation    Lack  of Transportation (Medical): No    Lack of Transportation (Non-Medical): No  Physical Activity: Insufficiently Active (07/25/2023)   Received from [facility]   Exercise Vital Sign    On average, how many days per week do you engage in moderate to strenuous exercise (like a brisk walk)?: 1 day    On average, how many minutes do you engage in exercise at this level?: 20 min  Stress: Stress Concern Present (07/25/2023)   Received from [facility] - Occupational Stress Questionnaire    Feeling of Stress : To some extent  Social Connections: Somewhat Isolated (07/25/2023)   Received from [facility]   Social Network    How would you rate your social network (family, work, friends)?: Restricted participation with some degree of social isolation    Allergies:  Allergies  Allergen Reactions   Apple Juice Itching and Swelling    Throat and mouth itching and swelling Throat and mouth itching and swelling   Bee Venom Anaphylaxis and Other (See Comments)    Weak, shaky, dizzy. Weak, shaky, dizzy.   Morphine Palpitations   Aspirin Nausea And Vomiting   Sulfa Antibiotics Nausea And Vomiting   Dicyclomine  Other (See Comments)    Aka Dicyclomine  Aka Dicyclomine    Other Other (See Comments)    Aka Dicyclomine    Penicillins Itching and Rash    Metabolic Disorder Labs: No results found for: HGBA1C, MPG No results found for: PROLACTIN No results found for: CHOL, TRIG, HDL, CHOLHDL, VLDL, LDLCALC No results found for: TSH  Therapeutic Level Labs: Lab Results  Component Value Date   LITHIUM  1.10 10/15/2014   No results found for: VALPROATE No results found for: CBMZ  Current Medications: Current Outpatient Medications  Medication Sig Dispense Refill   acetaminophen  (TYLENOL ) 500 MG tablet Take by mouth.     acyclovir  (ZOVIRAX ) 400 MG tablet TAKE (1) TABLET THREE TIMES DAILY AS NEEDED. 60 tablet 10   alprazolam   (XANAX ) 2 MG tablet Take 1 tablet (2 mg total) by mouth in the morning, at noon, in the evening, and at bedtime. 120 tablet 2   baclofen (LIORESAL) 10 MG tablet Take 10 mg by mouth 2 (two) times daily as needed.     buPROPion  (WELLBUTRIN  XL) 150 MG 24 hr tablet Take 1 tablet (150 mg total) by mouth every morning. 30 tablet 2   clobetasol  (TEMOVATE ) 0.05 % external solution Apply 1 application topically  2 (two) times daily. 50 mL 9   COSENTYX UNOREADY 300 MG/2ML SOAJ Inject 300 mg into the skin every 28 (twenty-eight) days.     cycloSPORINE  (RESTASIS ) 0.05 % ophthalmic emulsion Place 1 drop into both eyes 2 (two) times daily.     EPINEPHrine 0.3 mg/0.3 mL IJ SOAJ injection Inject into the muscle.     FLUoxetine  (PROZAC ) 20 MG capsule Take 1 capsule (20 mg total) by mouth daily. 30 capsule 2   FLUoxetine  (PROZAC ) 40 MG capsule Take 1 capsule (40 mg total) by mouth daily. 30 capsule 2   gabapentin  (NEURONTIN ) 600 MG tablet Take 600 mg by mouth 4 (four) times daily.     lamoTRIgine  (LAMICTAL ) 200 MG tablet TAKE  (1)  TABLET TWICE A DAY. 60 tablet 2   Lurasidone  HCl (LATUDA ) 120 MG TABS Take 1 tablet (120 mg total) by mouth daily. 30 tablet 2   Multiple Vitamins-Minerals (MENS 50+ MULTI VITAMIN/MIN) TABS Take 1 tablet by mouth every morning.     Omega-3 Fatty Acids (COROMEGA OMEGA 3 SQUEEZE) EMUL Take by mouth.     omeprazole (PRILOSEC) 20 MG capsule Take 20 mg by mouth daily.     Potassium Gluconate 2.5 MEQ TABS Take by mouth.     Suvorexant  (BELSOMRA ) 20 MG TABS Take 1 tablet (20 mg total) by mouth at bedtime. 30 tablet 2   tadalafil (CIALIS) 5 MG tablet Take 5 mg by mouth daily.     valbenazine  (INGREZZA ) 40 MG capsule Take 1 capsule (40 mg total) by mouth daily. 30 capsule 2   VEMLIDY 25 MG TABS Take 1 tablet by mouth daily.     Adalimumab  (HUMIRA  PEN) 40 MG/0.4ML PNKT INJECT 40MG  SUBCUTANEOUSLY EVERY 2 WEEKS AS DIRECTED. (Patient not taking: Reported on 11/27/2023) 2 each 8   No current  facility-administered medications for this visit.     Musculoskeletal: Strength & Muscle Tone: within normal limits Gait & Station: normal Patient leans: N/A  Psychiatric Specialty Exam: Review of Systems  Neurological:  Positive for tremors.       Tardive dyskinesia    Blood pressure 133/80, pulse 79, temperature 97.7 F (36.5 C), temperature source Oral, height 5' 5 (1.651 m), weight 162 lb (73.5 kg), SpO2 97%.Body mass index is 26.96 kg/m.  General Appearance: Casual and Fairly Groomed  Eye Contact:  Good  Speech:  Clear and Coherent  Volume:  Normal  Mood:  Euthymic  Affect:  Congruent  Thought Process:  Goal Directed  Orientation:  Full (Time, Place, and Person)  Thought Content: WDL   Suicidal Thoughts:  No  Homicidal Thoughts:  No  Memory:  Immediate;   Good Recent;   Good Remote;   NA  Judgement:  Good  Insight:  Good  Psychomotor Activity:  TD  Concentration:  Concentration: Good and Attention Span: Good  Recall:  Good  Fund of Knowledge: Good  Language: Good  Akathisia:  Yes  Handed:  Right  AIMS (if indicated): done  Assets:  Communication Skills Desire for Improvement Resilience Social Support  ADL's:  Intact  Cognition: WNL  Sleep:  Good   Screenings: AIMS    Flowsheet Row Office Visit from 11/27/2023 in [facility] at [place]  AIMS Total Score 19   PHQ2-9    Flowsheet Row Counselor from 01/20/2022 in [facility] at [place] Video Visit from 12/22/2021 in [facility] at [place] Video Visit from 10/19/2021 in [facility] at [facility] Video Visit from 09/15/2021 in [facility] at [place] Video Visit from 08/12/2021 in [facility] at [facility] Total Score 2 1 0 0 2  PHQ-9 Total Score 11 -- -- -- 7   Flowsheet Row Counselor from 01/20/2022 in [facility] at [place] ED from 01/16/2022 in [facility] Emergency Department at [facility] Video Visit from 12/22/2021 in [facility] at [place]  C-SSRS RISK CATEGORY Low Risk No Risk No Risk     Assessment and Plan: This patient is a 53 year old male with a history of bipolar disorder and anxiety.  Given his presentation today I do think that besides benign essential tremor he also has symptoms of tardive dyskinesia.  We will add Ingrezza  starting at 40 mg daily to his regimen.  He will return to see me in 6 weeks  Collaboration of Care: Collaboration of Care: Primary Care Provider AEB notes are shared with PCP on the epic system  Patient/Guardian was advised Release of Information must be obtained prior to any record release in order to collaborate their care with an outside provider. Patient/Guardian was advised if they have not already done so to contact the registration department to sign all necessary forms in order for us  to release information regarding their care.   Consent: Patient/Guardian gives verbal consent for treatment and assignment of benefits for services provided during this visit. Patient/Guardian expressed understanding and agreed to proceed.    [name], MD 11/27/2023, 4:45 PM

## 2024-01-04 ENCOUNTER — Emergency Department (HOSPITAL_BASED_OUTPATIENT_CLINIC_OR_DEPARTMENT_OTHER)

## 2024-01-04 ENCOUNTER — Other Ambulatory Visit: Payer: Self-pay

## 2024-01-04 ENCOUNTER — Emergency Department (HOSPITAL_BASED_OUTPATIENT_CLINIC_OR_DEPARTMENT_OTHER)
Admission: EM | Admit: 2024-01-04 | Discharge: 2024-01-04 | Disposition: A | Discharge status: Home / Self Care | Attending: Emergency Medicine | Admitting: Emergency Medicine

## 2024-01-04 ENCOUNTER — Emergency Department (HOSPITAL_BASED_OUTPATIENT_CLINIC_OR_DEPARTMENT_OTHER): Admitting: Radiology

## 2024-01-04 ENCOUNTER — Encounter (HOSPITAL_BASED_OUTPATIENT_CLINIC_OR_DEPARTMENT_OTHER): Payer: Self-pay

## 2024-01-04 DIAGNOSIS — S2231XA Fracture of one rib, right side, initial encounter for closed fracture: Secondary | ICD-10-CM | POA: Diagnosis not present

## 2024-01-04 DIAGNOSIS — W182XXA Fall in (into) shower or empty bathtub, initial encounter: Secondary | ICD-10-CM | POA: Diagnosis not present

## 2024-01-04 DIAGNOSIS — S299XXA Unspecified injury of thorax, initial encounter: Secondary | ICD-10-CM | POA: Diagnosis present

## 2024-01-04 LAB — BASIC METABOLIC PANEL WITH GFR
Anion gap: 11 (ref 5–15)
BUN: 10 mg/dL (ref 6–20)
CO2: 24 mmol/L (ref 22–32)
Calcium: 9.4 mg/dL (ref 8.9–10.3)
Chloride: 103 mmol/L (ref 98–111)
Creatinine, Ser: 1.08 mg/dL (ref 0.61–1.24)
GFR, Estimated: >60 mL/min (ref >60)
Glucose, Bld: 88 mg/dL (ref 70–99)
Potassium: 3.8 mmol/L (ref 3.5–5.1)
Sodium: 138 mmol/L (ref 135–145)

## 2024-01-04 LAB — CBC
HCT: 41.9 % (ref 39.0–52.0)
Hemoglobin: 14.9 g/dL (ref 13.0–17.0)
MCH: 33.6 pg (ref 26.0–34.0)
MCHC: 35.6 g/dL (ref 30.0–36.0)
MCV: 94.6 fL (ref 80.0–100.0)
Platelets: 279 K/uL (ref 150–400)
RBC: 4.43 MIL/uL (ref 4.22–5.81)
RDW: 13.4 % (ref 11.5–15.5)
WBC: 9.3 K/uL (ref 4.0–10.5)
nRBC: 0 % (ref 0.0–0.2)

## 2024-01-04 MED ORDER — METHOCARBAMOL 500 MG PO TABS
1000.0000 mg | ORAL_TABLET | Freq: Three times a day (TID) | ORAL | 0 refills | Status: AC | PRN
Start: 2024-01-04 — End: ?

## 2024-01-04 MED ORDER — ONDANSETRON HCL 4 MG/2ML IJ SOLN
4.0000 mg | Freq: Once | INTRAMUSCULAR | Status: AC
  Administered 2024-01-04: 4 mg via INTRAVENOUS
  Filled 2024-01-04: qty 2

## 2024-01-04 MED ORDER — LIDOCAINE 5 % EX PTCH
1.0000 | MEDICATED_PATCH | Freq: Once | CUTANEOUS | Status: DC
  Administered 2024-01-04: 1 via TRANSDERMAL
  Filled 2024-01-04: qty 1

## 2024-01-04 MED ORDER — METHOCARBAMOL 500 MG PO TABS
1000.0000 mg | ORAL_TABLET | Freq: Once | ORAL | Status: AC
  Administered 2024-01-04: 1000 mg via ORAL
  Filled 2024-01-04: qty 2

## 2024-01-04 MED ORDER — FENTANYL CITRATE PF 50 MCG/ML IJ SOSY
100.0000 ug | PREFILLED_SYRINGE | Freq: Once | INTRAMUSCULAR | Status: AC
  Administered 2024-01-04: 100 ug via INTRAVENOUS
  Filled 2024-01-04: qty 2

## 2024-01-04 MED ORDER — OXYCODONE HCL 5 MG PO TABS: 5.0000 mg | ORAL_TABLET | Freq: Four times a day (QID) | ORAL | 0 refills | Status: AC | PRN

## 2024-01-04 MED ORDER — OXYCODONE HCL 5 MG PO TABS
10.0000 mg | ORAL_TABLET | Freq: Once | ORAL | Status: AC
  Administered 2024-01-04: 10 mg via ORAL
  Filled 2024-01-04: qty 2

## 2024-01-04 MED ORDER — IOHEXOL 300 MG/ML  SOLN
75.0000 mL | Freq: Once | INTRAMUSCULAR | Status: AC | PRN
  Administered 2024-01-04: 75 mL via INTRAVENOUS

## 2024-01-04 MED ORDER — LIDOCAINE 5 % EX PTCH: 1.0000 | MEDICATED_PATCH | CUTANEOUS | 0 refills | Status: AC

## 2024-01-04 NOTE — ED Notes (Signed)
 Patient transported to CT

## 2024-01-04 NOTE — ED Provider Notes (Signed)
 [place] EMERGENCY DEPARTMENT AT [facility] Provider Note   CSN: [id number] Arrival date & time: 01/04/24  1449     Patient presents with: Roger Duran is a 53 y.o. male.   Patient on gabapentin  and alprazolam , reports history of poor balance --presents to the emergency department after a fall occurring around 4 PM yesterday.  Patient was in the shower.  He states that he slipped as he was getting out of the tub.  He landed hard on the right lateral rib area, striking the toilet.  He did not hit his head or lose consciousness.  Denies injury to the extremities.  Pain is worse with palpation, movement, deep breathing.  Patient states that he feels a bubbly sensation in the chest.  He has some shortness of breath.       Prior to Admission medications   Medication Sig Start Date End Date Taking? Authorizing Provider  acetaminophen  (TYLENOL ) 500 MG tablet Take by mouth.    [provider]  acyclovir  (ZOVIRAX ) 400 MG tablet TAKE (1) TABLET THREE TIMES DAILY AS NEEDED. 04/08/21   Livingston Rigg, MD  Adalimumab  (HUMIRA  PEN) 40 MG/0.4ML PNKT INJECT 40MG  SUBCUTANEOUSLY EVERY 2 WEEKS AS DIRECTED. Patient not taking: Reported on 11/27/2023 12/01/21   Sheffield, Kelli R, PA-C  alprazolam  (XANAX ) 2 MG tablet Take 1 tablet (2 mg total) by mouth in the morning, at noon, in the evening, and at bedtime. 11/21/23   Okey Barnie SAUNDERS, MD  baclofen (LIORESAL) 10 MG tablet Take 10 mg by mouth 2 (two) times daily as needed. 04/03/19   [provider]  buPROPion  (WELLBUTRIN  XL) 150 MG 24 hr tablet Take 1 tablet (150 mg total) by mouth every morning. 11/21/23 11/20/24  Okey Barnie SAUNDERS, MD  clobetasol  (TEMOVATE ) 0.05 % external solution Apply 1 application topically 2 (two) times daily. 07/14/20   Sheffield, Kelli R, PA-C  COSENTYX UNOREADY 300 MG/2ML SOAJ Inject 300 mg into the skin every 28 (twenty-eight) days. 11/07/23   [provider]  cycloSPORINE  (RESTASIS ) 0.05 %  ophthalmic emulsion Place 1 drop into both eyes 2 (two) times daily.    [provider]  EPINEPHrine 0.3 mg/0.3 mL IJ SOAJ injection Inject into the muscle. 11/15/19   [provider]  FLUoxetine  (PROZAC ) 20 MG capsule Take 1 capsule (20 mg total) by mouth daily. 11/21/23 11/20/24  Okey Barnie SAUNDERS, MD  FLUoxetine  (PROZAC ) 40 MG capsule Take 1 capsule (40 mg total) by mouth daily. 11/21/23   Okey Barnie SAUNDERS, MD  gabapentin  (NEURONTIN ) 600 MG tablet Take 600 mg by mouth 4 (four) times daily. 06/20/20   [provider]  lamoTRIgine  (LAMICTAL ) 200 MG tablet TAKE  (1)  TABLET TWICE A DAY. 11/21/23   Okey Barnie SAUNDERS, MD  Lurasidone  HCl (LATUDA ) 120 MG TABS Take 1 tablet (120 mg total) by mouth daily. 11/21/23   Okey Barnie SAUNDERS, MD  Multiple Vitamins-Minerals (MENS 50+ MULTI VITAMIN/MIN) TABS Take 1 tablet by mouth every morning.    [provider]  Omega-3 Fatty Acids (COROMEGA OMEGA 3 SQUEEZE) EMUL Take by mouth.    [provider]  omeprazole (PRILOSEC) 20 MG capsule Take 20 mg by mouth daily.    [provider]  Potassium Gluconate 2.5 MEQ TABS Take by mouth.    [provider]  Suvorexant  (BELSOMRA ) 20 MG TABS Take 1 tablet (20 mg total) by mouth at bedtime. 11/21/23   Okey Barnie SAUNDERS, MD  tadalafil (CIALIS) 5 MG  tablet Take 5 mg by mouth daily. 09/14/21   [provider]  valbenazine  (INGREZZA ) 40 MG capsule Take 1 capsule (40 mg total) by mouth daily. 11/27/23   Okey Barnie SAUNDERS, MD  VEMLIDY 25 MG TABS Take 1 tablet by mouth daily. 06/20/20   [provider]    Allergies: Apple juice, Bee venom, Morphine, Aspirin, Sulfa antibiotics, Dicyclomine , Other, and Penicillins    Review of Systems  Updated Vital Signs BP (!) 142/97 (BP Location: Right Arm)   Pulse 67   Temp 97.8 F (36.6 C)   Resp 20   Ht 5' 7 (1.702 m)   Wt 71.7 kg   SpO2 96%   BMI 24.75 kg/m   Physical Exam Vitals and nursing note reviewed.   Constitutional:      General: He is in acute distress.     Appearance: He is well-developed.     Comments: Patient appears uncomfortable.  HENT:     Head: Normocephalic and atraumatic.     Nose: Nose normal.     Mouth/Throat:     Mouth: Mucous membranes are moist.  Eyes:     General:        Right eye: No discharge.        Left eye: No discharge.     Conjunctiva/sclera: Conjunctivae normal.  Cardiovascular:     Rate and Rhythm: Normal rate and regular rhythm.     Heart sounds: Normal heart sounds.  Pulmonary:     Effort: Pulmonary effort is normal.     Breath sounds: Normal breath sounds.     Comments: Lung sounds clear bilaterally. Chest:     Comments: Patient with exquisite tenderness to palpation over the left lateral rib cage. Abdominal:     Palpations: Abdomen is soft.     Tenderness: There is no abdominal tenderness.  Musculoskeletal:     Cervical back: Normal range of motion and neck supple.  Skin:    General: Skin is warm and dry.  Neurological:     Mental Status: He is alert.     (all labs ordered are listed, but only abnormal results are displayed) Labs Reviewed  BASIC METABOLIC PANEL WITH GFR  CBC    EKG: None  Radiology: DG Ribs Unilateral W/Chest Right Result Date: 01/04/2024 CLINICAL DATA:  Fall and back pain EXAM: RIGHT RIBS AND CHEST - 3+ VIEW COMPARISON:  Chest x-ray 09/19/2003 FINDINGS: Generator overlies the left chest, unchanged. No fracture or other bone lesions are seen involving the ribs. There is no evidence of pneumothorax or pleural effusion. Both lungs are clear. Heart size and mediastinal contours are within normal limits. IMPRESSION: Negative. Electronically Signed   By: Greig Pique M.D.   On: 01/04/2024 17:41     Procedures   Medications Ordered in the ED  fentaNYL  (SUBLIMAZE ) injection 100 mcg (has no administration in time range)  ondansetron  (ZOFRAN ) injection 4 mg (has no administration in time range)  lidocaine  (LIDODERM ) 5 %  1 patch (has no administration in time range)  methocarbamol  (ROBAXIN ) tablet 1,000 mg (has no administration in time range)   ED Course  Patient seen and examined. History obtained directly from patient.  Patient appears uncomfortable.  Labs/EKG: Ordered CBC, BMP  Imaging: Ordered CT chest with contrast.  Medications/Fluids: Ordered: IV fentanyl /Zofran , p.o. Robaxin , Lidoderm  patch.   Most recent vital signs reviewed and are as follows: BP (!) 142/97 (BP Location: Right Arm)   Pulse 67   Temp 97.8 F (36.6 C)  Resp 20   Ht 5' 7 (1.702 m)   Wt 71.7 kg   SpO2 96%   BMI 24.75 kg/m   Initial impression: Right-sided chest trauma, negative plain film  8:53 PM Reassessment performed. Patient appears stable.  Labs personally reviewed and interpreted including: CBC and BMP were unremarkable.  Imaging personally visualized and interpreted including: CT chest shows minimally displaced rib fracture, normal lungs without pneumothorax.  Reviewed pertinent lab work and imaging with patient at bedside. Questions answered.   Most current vital signs reviewed and are as follows: BP (!) 150/100   Pulse 62   Temp 97.8 F (36.6 C)   Resp 18   Ht 5' 7 (1.702 m)   Wt 71.7 kg   SpO2 97%   BMI 24.75 kg/m   Plan: Discharge to home.   Prescriptions written for: Oxycodone  # 10 tablets, Robaxin , Lidoderm  patch  Patient counseled on use of narcotic pain medications. Urged not to drink alcohol, drive, or perform any other activities that requires focus while taking these medications. The patient verbalizes understanding and agrees with the plan.  Other home care instructions discussed: Incentive spirometer  ED return instructions discussed: New or worsening symptoms, uncontrolled pain, difficulty breathing  Follow-up instructions discussed: Patient encouraged to follow-up with their PCP in 7 days.                                    Medical Decision Making Amount and/or Complexity  of Data Reviewed Labs: ordered. Radiology: ordered.  Risk Prescription drug management.   Patient with mechanical fall in tub.  He fell onto his right side and sustained a rib fracture.  No underlying pulmonary injury.  Vitals are normal.  Labs are reassuring.  Pain control indicated at this time.  Patient required several doses of IV opioids here, transition to oral meds.  Doing relatively well.  Vitals are stable.  The patient's vital signs, pertinent lab work and imaging were reviewed and interpreted as discussed in the ED course. Hospitalization was considered for further testing, treatments, or serial exams/observation. However as patient is well-appearing, has a stable exam, and reassuring studies today, I do not feel that they warrant admission at this time. This plan was discussed with the patient who verbalizes agreement and comfort with this plan and seems reliable and able to return to the Emergency Department with worsening or changing symptoms.      Final diagnoses:  Closed fracture of one rib of right side, initial encounter    ED Discharge Orders          Ordered    oxyCODONE  (OXY IR/ROXICODONE ) 5 MG immediate release tablet  Every 6 hours PRN        01/04/24 2046    methocarbamol  (ROBAXIN ) 500 MG tablet  Every 8 hours PRN        01/04/24 2046    lidocaine  (LIDODERM ) 5 %  Every 24 hours        01/04/24 2046               Desiderio Chew, PA-C 01/04/24 2055    Zackowski, Scott, MD 01/05/24 [phone]

## 2024-01-04 NOTE — ED Triage Notes (Signed)
 Pt reports mechanical fall out of bathtub last night, hitting R flank/ribs and R sided back on toilet. Denies hitting head, denies thinners. Reports increased pain with movement, breathing, coughing. Abrasion noted to R sided back. Reports hx DBS.

## 2024-01-04 NOTE — Discharge Instructions (Addendum)
 Please read and follow all provided instructions.  Your diagnoses today include:  1. Closed fracture of one rib of right side, initial encounter    Tests performed today include: Complete blood cell count: Normal Basic metabolic panel: No concerning findings CT of the chest with contrast shows a broken rib on the right side, no lung injury Vital signs. See below for your results today.   Medications prescribed:  Oxycodone  - narcotic pain medication  DO NOT drive or perform any activities that require you to be awake and alert because this medicine can make you drowsy.   Robaxin  (methocarbamol ) - muscle relaxer medication  DO NOT drive or perform any activities that require you to be awake and alert because this medicine can make you drowsy.   Lidoderm  patch  Take any prescribed medications only as directed.  Home care instructions:  Follow any educational materials contained in this packet.  Use incentive spirometer 10 times per hour while awake.  BE VERY CAREFUL not to take multiple medicines containing Tylenol  (also called acetaminophen ). Doing so can lead to an overdose which can damage your liver and cause liver failure and possibly death.   Follow-up instructions: Please follow-up with your primary care provider in the next 7 days for further evaluation of your symptoms.   Return instructions:  Please return to the Emergency Department if you experience worsening symptoms.  Return with uncontrolled pain, worsening shortness of breath or difficulty breathing. Please return if you have any other emergent concerns.  Additional Information:  Your vital signs today were: BP (!) 150/100   Pulse 62   Temp 97.8 F (36.6 C)   Resp 18   Ht 5' 7 (1.702 m)   Wt 71.7 kg   SpO2 97%   BMI 24.75 kg/m  If your blood pressure (BP) was elevated above 135/85 this visit, please have this repeated by your doctor within one month. --------------

## 2024-01-08 ENCOUNTER — Telehealth (INDEPENDENT_AMBULATORY_CARE_PROVIDER_SITE_OTHER): Admitting: Psychiatry

## 2024-01-08 ENCOUNTER — Encounter (HOSPITAL_COMMUNITY): Payer: Self-pay | Admitting: Psychiatry

## 2024-01-08 DIAGNOSIS — F411 Generalized anxiety disorder: Secondary | ICD-10-CM | POA: Diagnosis not present

## 2024-01-08 DIAGNOSIS — G2401 Drug induced subacute dyskinesia: Secondary | ICD-10-CM | POA: Diagnosis not present

## 2024-01-08 DIAGNOSIS — F3130 Bipolar disorder, current episode depressed, mild or moderate severity, unspecified: Secondary | ICD-10-CM | POA: Diagnosis not present

## 2024-01-08 MED ORDER — BUPROPION HCL ER (XL) 150 MG PO TB24: 150.0000 mg | ORAL_TABLET | ORAL | 2 refills | Status: DC

## 2024-01-08 MED ORDER — LURASIDONE HCL 120 MG PO TABS: 120.0000 mg | ORAL_TABLET | Freq: Every day | ORAL | 2 refills | Status: DC

## 2024-01-08 MED ORDER — FLUOXETINE HCL 20 MG PO CAPS: 20.0000 mg | ORAL_CAPSULE | Freq: Every day | ORAL | 2 refills | Status: DC

## 2024-01-08 MED ORDER — LAMOTRIGINE 200 MG PO TABS: ORAL_TABLET | ORAL | 2 refills | Status: DC

## 2024-01-08 MED ORDER — FLUOXETINE HCL 40 MG PO CAPS: 40.0000 mg | ORAL_CAPSULE | Freq: Every day | ORAL | 2 refills | Status: DC

## 2024-01-08 MED ORDER — BELSOMRA 20 MG PO TABS: 20.0000 mg | ORAL_TABLET | Freq: Every day | ORAL | 2 refills | Status: DC

## 2024-01-08 MED ORDER — ALPRAZOLAM 2 MG PO TABS: 2.0000 mg | ORAL_TABLET | Freq: Four times a day (QID) | ORAL | 2 refills | Status: DC

## 2024-01-08 NOTE — Progress Notes (Signed)
 Virtual Visit via Video Note  I connected with Roger Duran on 01/08/24 at  9:40 AM EDT by a video enabled telemedicine application and verified that I am speaking with the correct person using two identifiers.  Location: Patient: home Provider: office   I discussed the limitations of evaluation and management by telemedicine and the availability of in person appointments. The patient expressed understanding and agreed to proceed.     I discussed the assessment and treatment plan with the patient. The patient was provided an opportunity to ask questions and all were answered. The patient agreed with the plan and demonstrated an understanding of the instructions.   The patient was advised to call back or seek an in-person evaluation if the symptoms worsen or if the condition fails to improve as anticipated.  I provided 20 minutes of non-face-to-face time during this encounter.   [name], MD  [facility] MD/PA/NP OP Progress Note  01/08/2024 9:56 AM Roger Duran  MRN:  [id number]  Chief Complaint:  Chief Complaint  Patient presents with   Anxiety   Depression   Manic Behavior   Follow-up   HPI: This patient is a 53 year old single white male lives with his roommate in [place]. He is on disability for bipolar disorder.   The patient returns for follow-up after 6 weeks regarding his anxiety depression and bipolar disorder.  He also seem to have tardive dyskinesia.  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  When he was last in the office at the last visit it was evident that he was moving his arms and hands and legs his aims score was 19.  We did send in Ingrezza  but apparently his pharmacy was not able to get it.  Unfortunately the patient became off balance in the shower last week and fell and broke one of his ribs.  He is still in a fair amount of pain.  However he states his mood is stable and he denies  significant depression anxiety or manic symptoms.  He is sleeping fairly well although he still is having trouble getting up.  He denies any thoughts of self-harm or suicide Visit Diagnosis:    ICD-10-CM   1. Bipolar I disorder, most recent episode depressed (HCC)  F31.30     2. GAD (generalized anxiety disorder)  F41.1     3. Tardive dyskinesia  G24.01       Past Psychiatric History: Hospitalization in 2009 after a suicide attempt, otherwise outpatient treatment  Past Medical History:  Past Medical History:  Diagnosis Date   Anxiety    Bipolar 1 disorder (HCC)    Depression    Mania (HCC)    Psoriasis     Past Surgical History:  Procedure Laterality Date   APPENDECTOMY     back procedures     COLONOSCOPY N/A 11/14/2014   Procedure: COLONOSCOPY;  Surgeon: Claudis RAYMOND Rivet, MD;  Location: [facility];  Service: Endoscopy;  Laterality: N/A;  11:10 - moved to 8:30 - Ann to notify   TONSILLECTOMY     WRIST SURGERY      Family Psychiatric History: See below  Family History:  Family History  Problem Relation Age of Onset   Hyperlipidemia Mother    Heart disease Father    Anxiety disorder Father    Bipolar disorder Maternal Grandfather     Social History:  Social History   Socioeconomic History  Marital status: Single    Spouse name: Not on file   Number of children: Not on file   Years of education: Not on file   Highest education level: Not on file  Occupational History   Not on file  Tobacco Use   Smoking status: Former    Current packs/day: 0.00    Types: Cigarettes, E-cigarettes    Quit date: 11/21/2019    Years since quitting: 4.1   Smokeless tobacco: Former  Building services engineer status: Never Used  Substance and Sexual Activity   Alcohol use: No    Alcohol/week: 0.0 standard drinks of alcohol    Comment: rarely   Drug use: Yes    Types: Marijuana    Comment: 1995-2000 PER PT HE DID A LOT OF COCAINE AND PILLS AND DRANK A LOT   Sexual activity: Not  Currently    Partners: Male    Birth control/protection: Condom  Other Topics Concern   Not on file  Social History Narrative   Not on file   Social Drivers of Health   Financial Resource Strain: Low Risk  (07/25/2023)   Received from [facility]   Overall Financial Resource Strain (CARDIA)    Difficulty of Paying Living Expenses: Not very hard  Food Insecurity: Food Insecurity Present (07/25/2023)   Received from [facility]   Hunger Vital Sign    Within the past 12 months, you worried that your food would run out before you got the money to buy more.: Never true    Within the past 12 months, the food you bought just didn't last and you didn't have money to get more.: Sometimes true  Transportation Needs: No Transportation Needs (07/25/2023)   Received from [facility] - Transportation    Lack of Transportation (Medical): No    Lack of Transportation (Non-Medical): No  Physical Activity: Insufficiently Active (07/25/2023)   Received from [facility]   Exercise Vital Sign    On average, how many days per week do you engage in moderate to strenuous exercise (like a brisk walk)?: 1 day    On average, how many minutes do you engage in exercise at this level?: 20 min  Stress: Stress Concern Present (07/25/2023)   Received from [facility] - Occupational Stress Questionnaire    Feeling of Stress : To some extent  Social Connections: Somewhat Isolated (07/25/2023)   Received from [facility]   Social Network    How would you rate your social network (family, work, friends)?: Restricted participation with some degree of social isolation    Allergies:  Allergies  Allergen Reactions   Apple Juice Itching and Swelling    Throat and mouth itching and swelling Throat and mouth itching and swelling   Bee Venom Anaphylaxis and Other (See Comments)    Weak, shaky, dizzy. Weak, shaky, dizzy.   Morphine Palpitations   Aspirin  Nausea And Vomiting   Sulfa Antibiotics Nausea And Vomiting   Dicyclomine  Other (See Comments)    Aka Dicyclomine  Aka Dicyclomine    Other Other (See Comments)    Aka Dicyclomine    Penicillins Itching and Rash    Metabolic Disorder Labs: No results found for: HGBA1C, MPG No results found for: PROLACTIN No results found for: CHOL, TRIG, HDL, CHOLHDL, VLDL, LDLCALC No results found for: TSH  Therapeutic Level Labs: Lab Results  Component Value Date   LITHIUM  1.10 10/15/2014   No results found for:  VALPROATE No results found for: CBMZ  Current Medications: Current Outpatient Medications  Medication Sig Dispense Refill   acetaminophen  (TYLENOL ) 500 MG tablet Take by mouth.     acyclovir  (ZOVIRAX ) 400 MG tablet TAKE (1) TABLET THREE TIMES DAILY AS NEEDED. 60 tablet 10   Adalimumab  (HUMIRA  PEN) 40 MG/0.4ML PNKT INJECT 40MG  SUBCUTANEOUSLY EVERY 2 WEEKS AS DIRECTED. (Patient not taking: Reported on 11/27/2023) 2 each 8   alprazolam  (XANAX ) 2 MG tablet Take 1 tablet (2 mg total) by mouth in the morning, at noon, in the evening, and at bedtime. 120 tablet 2   buPROPion  (WELLBUTRIN  XL) 150 MG 24 hr tablet Take 1 tablet (150 mg total) by mouth every morning. 30 tablet 2   clobetasol  (TEMOVATE ) 0.05 % external solution Apply 1 application topically 2 (two) times daily. 50 mL 9   COSENTYX UNOREADY 300 MG/2ML SOAJ Inject 300 mg into the skin every 28 (twenty-eight) days.     cycloSPORINE  (RESTASIS ) 0.05 % ophthalmic emulsion Place 1 drop into both eyes 2 (two) times daily.     EPINEPHrine 0.3 mg/0.3 mL IJ SOAJ injection Inject into the muscle.     FLUoxetine  (PROZAC ) 20 MG capsule Take 1 capsule (20 mg total) by mouth daily. 30 capsule 2   FLUoxetine  (PROZAC ) 40 MG capsule Take 1 capsule (40 mg total) by mouth daily. 30 capsule 2   gabapentin  (NEURONTIN ) 600 MG tablet Take 600 mg by mouth 4 (four) times daily.     lamoTRIgine  (LAMICTAL ) 200 MG tablet TAKE  (1)  TABLET  TWICE A DAY. 60 tablet 2   lidocaine  (LIDODERM ) 5 % Place 1 patch onto the skin daily. Remove & Discard patch within 12 hours or as directed by MD 14 patch 0   Lurasidone  HCl (LATUDA ) 120 MG TABS Take 1 tablet (120 mg total) by mouth daily. 30 tablet 2   methocarbamol  (ROBAXIN ) 500 MG tablet Take 2 tablets (1,000 mg total) by mouth every 8 (eight) hours as needed for muscle spasms. 30 tablet 0   Multiple Vitamins-Minerals (MENS 50+ MULTI VITAMIN/MIN) TABS Take 1 tablet by mouth every morning.     Omega-3 Fatty Acids (COROMEGA OMEGA 3 SQUEEZE) EMUL Take by mouth.     omeprazole (PRILOSEC) 20 MG capsule Take 20 mg by mouth daily.     oxyCODONE  (OXY IR/ROXICODONE ) 5 MG immediate release tablet Take 1 tablet (5 mg total) by mouth every 6 (six) hours as needed for severe pain (pain score 7-10). 10 tablet 0   Potassium Gluconate 2.5 MEQ TABS Take by mouth.     Suvorexant  (BELSOMRA ) 20 MG TABS Take 1 tablet (20 mg total) by mouth at bedtime. 30 tablet 2   tadalafil (CIALIS) 5 MG tablet Take 5 mg by mouth daily.     valbenazine  (INGREZZA ) 40 MG capsule Take 1 capsule (40 mg total) by mouth daily. 30 capsule 2   VEMLIDY 25 MG TABS Take 1 tablet by mouth daily.     No current facility-administered medications for this visit.     Musculoskeletal: Strength & Muscle Tone: within normal limits Gait & Station: normal Patient leans: N/A  Psychiatric Specialty Exam: Review of Systems  Musculoskeletal:  Positive for arthralgias.  Neurological:  Positive for tremors and light-headedness.  All other systems reviewed and are negative.   There were no vitals taken for this visit.There is no height or weight on file to calculate BMI.  General Appearance: Casual and Fairly Groomed  Eye Contact:  Good  Speech:  Clear and Coherent  Volume:  Normal  Mood:  Euthymic  Affect:  Congruent  Thought Process:  Goal Directed  Orientation:  Full (Time, Place, and Person)  Thought Content: Rumination   Suicidal  Thoughts:  No  Homicidal Thoughts:  No  Memory:  Immediate;   Good Recent;   Good Remote;   Fair  Judgement:  Good  Insight:  Good  Psychomotor Activity:  TD  Concentration:  Concentration: Good and Attention Span: Good  Recall:  Good  Fund of Knowledge: Good  Language: Good  Akathisia:  No  Handed:  Right  AIMS (if indicated): not done  Assets:  Communication Skills Desire for Improvement Resilience Social Support  ADL's:  Intact  Cognition: WNL  Sleep:  Good   Screenings: AIMS    Flowsheet Row Office Visit from 11/27/2023 in [facility] [place]  AIMS Total Score 19   PHQ2-9    Flowsheet Row Counselor from 01/20/2022 in [facility] [place] Video Visit from 12/22/2021 in [facility] at [place] Video Visit from 10/19/2021 in [facility] at [place] Video Visit from 09/15/2021 in [facility] at [place] Video Visit from 08/12/2021 in [facility] Total Score 2 1 0 0 2  PHQ-9 Total Score 11 -- -- -- 7   Flowsheet Row ED from 01/04/2024 in [facility] at [facility] Counselor from 01/20/2022 in [facility] [place] ED from 01/16/2022 in [facility] Emergency Department at [facility]  C-SSRS RISK CATEGORY No Risk Low Risk No Risk     Assessment and Plan: This patient is a 53 year old male with a history of bipolar disorder anxiety benign essential tremor but and as well as tardive dyskinesia.  He is doing well on his current regimen.  He will continue Prozac  60 mg daily for depression, lurasidone  120 mg daily as well as Lamictal  200 mg daily for mood stabilization, Xanax  2 mg 4 times daily for anxiety, Wellbutrin  XL 150 mg daily for sexual side effects and Belsomra  20 mg at bedtime as needed for sleep.  We will  again try to send in the Ingrezza  40 mg for tardive dyskinesia.  He will return to see me in 2 months  Collaboration of Care: Collaboration of Care: Primary Care Provider AEB notes are shared with PCP on the epic system  Patient/Guardian was advised Release of Information must be obtained prior to any record release in order to collaborate their care with an outside provider. Patient/Guardian was advised if they have not already done so to contact the registration department to sign all necessary forms in order for us  to release information regarding their care.   Consent: Patient/Guardian gives verbal consent for treatment and assignment of benefits for services provided during this visit. Patient/Guardian expressed understanding and agreed to proceed.    [name], MD 01/08/2024, 9:56 AM

## 2024-04-01 ENCOUNTER — Telehealth (HOSPITAL_COMMUNITY): Admitting: Psychiatry

## 2024-04-04 ENCOUNTER — Encounter (HOSPITAL_COMMUNITY): Payer: Self-pay | Admitting: Psychiatry

## 2024-04-04 ENCOUNTER — Telehealth (INDEPENDENT_AMBULATORY_CARE_PROVIDER_SITE_OTHER): Admitting: Psychiatry

## 2024-04-04 DIAGNOSIS — F3130 Bipolar disorder, current episode depressed, mild or moderate severity, unspecified: Secondary | ICD-10-CM

## 2024-04-04 DIAGNOSIS — F411 Generalized anxiety disorder: Secondary | ICD-10-CM

## 2024-04-04 DIAGNOSIS — G2401 Drug induced subacute dyskinesia: Secondary | ICD-10-CM

## 2024-04-04 MED ORDER — LAMOTRIGINE 200 MG PO TABS: ORAL_TABLET | ORAL | 2 refills | Status: DC

## 2024-04-04 MED ORDER — LURASIDONE HCL 120 MG PO TABS: 120.0000 mg | ORAL_TABLET | Freq: Every day | ORAL | 2 refills | Status: DC

## 2024-04-04 MED ORDER — BELSOMRA 20 MG PO TABS: 20.0000 mg | ORAL_TABLET | Freq: Every day | ORAL | 2 refills | Status: DC

## 2024-04-04 MED ORDER — FLUOXETINE HCL 40 MG PO CAPS: 40.0000 mg | ORAL_CAPSULE | Freq: Every day | ORAL | 2 refills | Status: DC

## 2024-04-04 MED ORDER — BUPROPION HCL ER (XL) 150 MG PO TB24: 150.0000 mg | ORAL_TABLET | ORAL | 2 refills | Status: DC

## 2024-04-04 MED ORDER — FLUOXETINE HCL 20 MG PO CAPS: 20.0000 mg | ORAL_CAPSULE | Freq: Every day | ORAL | 2 refills | Status: DC

## 2024-04-04 MED ORDER — ALPRAZOLAM 2 MG PO TABS: 2.0000 mg | ORAL_TABLET | Freq: Four times a day (QID) | ORAL | 2 refills | Status: AC

## 2024-04-04 NOTE — Progress Notes (Signed)
 Virtual Visit via Video Note  I connected with Roger Duran on 04/04/24 at  1:20 PM EST by a video enabled telemedicine application and verified that I am speaking with the correct person using two identifiers.  Location: Patient: home Provider: office   I discussed the limitations of evaluation and management by telemedicine and the availability of in person appointments. The patient expressed understanding and agreed to proceed.    I discussed the assessment and treatment plan with the patient. The patient was provided an opportunity to ask questions and all were answered. The patient agreed with the plan and demonstrated an understanding of the instructions.   The patient was advised to call back or seek an in-person evaluation if the symptoms worsen or if the condition fails to improve as anticipated.  I provided 20 minutes of non-face-to-face time during this encounter.   [name], MD  [facility] MD/PA/NP OP Progress Note  04/04/2024 1:43 PM Roger Duran  MRN:  [id number]  Chief Complaint:  Chief Complaint  Patient presents with   Anxiety   Depression   Follow-up   Manic Behavior   HPI: This patient is a 53 year old single white male lives with his roommate in [place]. He is on disability for bipolar disorder   The patient returns for follow-up after 3 months regarding generalized anxiety depression and bipolar disorder.  He states that he is continuing to have falls.  In the past he broke his ribs he is also injured his wrist.  He does have a deep brain stimulator in his basal ganglia for benign essential tremor.  He is not sure if this is impacting it or not.  He states he gets different answers from different doctors when he sees the neurology team.  He would like to cut down on the Xanax  just in case it is contributing and I think this is a good idea.  He is on 2 mg 4 times a day and I told him he could cut 1 in half for 2 weeks and then go on every 2 weeks with  cutting 1-1/2 until he is on 1 mg 4 times a day.  This might take about 6 weeks.  The patient is overwhelmed with all of his medical issues.  However he denies serious depression or thoughts of self-harm.  He denies manic symptoms.  He is generally sleeping well. Visit Diagnosis:    ICD-10-CM   1. Bipolar I disorder, most recent episode depressed (HCC)  F31.30     2. GAD (generalized anxiety disorder)  F41.1     3. Tardive dyskinesia  G24.01       Past Psychiatric History: Hospitalization in 2009 after a suicide attempt, otherwise outpatient treatment  Past Medical History:  Past Medical History:  Diagnosis Date   Anxiety    Bipolar 1 disorder (HCC)    Depression    Mania (HCC)    Psoriasis     Past Surgical History:  Procedure Laterality Date   APPENDECTOMY     back procedures     COLONOSCOPY N/A 11/14/2014   Procedure: COLONOSCOPY;  Surgeon: Claudis RAYMOND Rivet, MD;  [facility]: [facility];  Service: Endoscopy;  Laterality: N/A;  11:10 - moved to 8:30 - Ann to notify   TONSILLECTOMY     WRIST SURGERY      Family Psychiatric History: See below  Family History:  Family History  Problem Relation Age of Onset   Hyperlipidemia Mother    Heart disease Father  Anxiety disorder Father    Bipolar disorder Maternal Grandfather     Social History:  Social History   Socioeconomic History   Marital status: Single    Spouse name: Not on file   Number of children: Not on file   Years of education: Not on file   Highest education level: Not on file  Occupational History   Not on file  Tobacco Use   Smoking status: Former    Current packs/day: 0.00    Types: Cigarettes, E-cigarettes    Quit date: 11/21/2019    Years since quitting: 4.3   Smokeless tobacco: Former  Building Services Engineer status: Never Used  Substance and Sexual Activity   Alcohol use: No    Alcohol/week: 0.0 standard drinks of alcohol    Comment: rarely   Drug use: Yes    Types: Marijuana    Comment:  1995-2000 PER PT HE DID A LOT OF COCAINE AND PILLS AND DRANK A LOT   Sexual activity: Not Currently    Partners: Male    Birth control/protection: Condom  Other Topics Concern   Not on file  Social History Narrative   Not on file   Social Drivers of Health   Financial Resource Strain: Low Risk  (07/25/2023)   Received from [facility]   Overall Financial Resource Strain (CARDIA)    Difficulty of Paying Living Expenses: Not very hard  Food Insecurity: No Food Insecurity (02/29/2024)   Received from [facility]   Hunger Vital Sign    Within the past 12 months, you worried that your food would run out before you got the money to buy more.: Never true    Within the past 12 months, the food you bought just didn't last and you didn't have money to get more.: Never true  Transportation Needs: No Transportation Needs (02/29/2024)   Received from [facility] - Transportation    Lack of Transportation (Medical): No    Lack of Transportation (Non-Medical): No  Physical Activity: Insufficiently Active (07/25/2023)   Received from [facility]   Exercise Vital Sign    On average, how many days per week do you engage in moderate to strenuous exercise (like a brisk walk)?: 1 day    On average, how many minutes do you engage in exercise at this level?: 20 min  Stress: Stress Concern Present (07/25/2023)   Received from [facility] - Occupational Stress Questionnaire    Feeling of Stress : To some extent  Social Connections: Somewhat Isolated (07/25/2023)   Received from [facility]   Social Network    How would you rate your social network (family, work, friends)?: Restricted participation with some degree of social isolation    Allergies:  Allergies  Allergen Reactions   Apple Juice Itching and Swelling    Throat and mouth itching and swelling Throat and mouth itching and swelling   Bee Venom Anaphylaxis and Other (See  Comments)    Weak, shaky, dizzy. Weak, shaky, dizzy.   Morphine Palpitations   Aspirin Nausea And Vomiting   Sulfa Antibiotics Nausea And Vomiting   Dicyclomine  Other (See Comments)    Aka Dicyclomine  Aka Dicyclomine    Other Other (See Comments)    Aka Dicyclomine    Penicillins Itching and Rash    Metabolic Disorder Labs: No results found for: HGBA1C, MPG No results found for: PROLACTIN No results found for: CHOL, TRIG, HDL, CHOLHDL, VLDL,  LDLCALC No results found for: TSH  Therapeutic Level Labs: Lab Results  Component Value Date   LITHIUM  1.10 10/15/2014   No results found for: VALPROATE No results found for: CBMZ  Current Medications: Current Outpatient Medications  Medication Sig Dispense Refill   acetaminophen  (TYLENOL ) 500 MG tablet Take by mouth.     acyclovir  (ZOVIRAX ) 400 MG tablet TAKE (1) TABLET THREE TIMES DAILY AS NEEDED. 60 tablet 10   Adalimumab  (HUMIRA  PEN) 40 MG/0.4ML PNKT INJECT 40MG  SUBCUTANEOUSLY EVERY 2 WEEKS AS DIRECTED. (Patient not taking: Reported on 11/27/2023) 2 each 8   alprazolam  (XANAX ) 2 MG tablet Take 1 tablet (2 mg total) by mouth in the morning, at noon, in the evening, and at bedtime. 120 tablet 2   buPROPion  (WELLBUTRIN  XL) 150 MG 24 hr tablet Take 1 tablet (150 mg total) by mouth every morning. 30 tablet 2   clobetasol  (TEMOVATE ) 0.05 % external solution Apply 1 application topically 2 (two) times daily. 50 mL 9   COSENTYX UNOREADY 300 MG/2ML SOAJ Inject 300 mg into the skin every 28 (twenty-eight) days.     cycloSPORINE  (RESTASIS ) 0.05 % ophthalmic emulsion Place 1 drop into both eyes 2 (two) times daily.     EPINEPHrine 0.3 mg/0.3 mL IJ SOAJ injection Inject into the muscle.     FLUoxetine  (PROZAC ) 20 MG capsule Take 1 capsule (20 mg total) by mouth daily. 30 capsule 2   FLUoxetine  (PROZAC ) 40 MG capsule Take 1 capsule (40 mg total) by mouth daily. 30 capsule 2   gabapentin  (NEURONTIN ) 600 MG tablet Take 600 mg by  mouth 4 (four) times daily.     lamoTRIgine  (LAMICTAL ) 200 MG tablet TAKE  (1)  TABLET TWICE A DAY. 60 tablet 2   lidocaine  (LIDODERM ) 5 % Place 1 patch onto the skin daily. Remove & Discard patch within 12 hours or as directed by MD 14 patch 0   Lurasidone  HCl (LATUDA ) 120 MG TABS Take 1 tablet (120 mg total) by mouth daily. 30 tablet 2   methocarbamol  (ROBAXIN ) 500 MG tablet Take 2 tablets (1,000 mg total) by mouth every 8 (eight) hours as needed for muscle spasms. 30 tablet 0   Multiple Vitamins-Minerals (MENS 50+ MULTI VITAMIN/MIN) TABS Take 1 tablet by mouth every morning.     Omega-3 Fatty Acids (COROMEGA OMEGA 3 SQUEEZE) EMUL Take by mouth.     omeprazole (PRILOSEC) 20 MG capsule Take 20 mg by mouth daily.     oxyCODONE  (OXY IR/ROXICODONE ) 5 MG immediate release tablet Take 1 tablet (5 mg total) by mouth every 6 (six) hours as needed for severe pain (pain score 7-10). 10 tablet 0   Potassium Gluconate 2.5 MEQ TABS Take by mouth.     Suvorexant  (BELSOMRA ) 20 MG TABS Take 1 tablet (20 mg total) by mouth at bedtime. 30 tablet 2   tadalafil (CIALIS) 5 MG tablet Take 5 mg by mouth daily.     valbenazine  (INGREZZA ) 40 MG capsule Take 1 capsule (40 mg total) by mouth daily. 30 capsule 2   VEMLIDY 25 MG TABS Take 1 tablet by mouth daily.     No current facility-administered medications for this visit.     Musculoskeletal: Strength & Muscle Tone: decreased Gait & Station: unsteady Patient leans: N/A  Psychiatric Specialty Exam: Review of Systems  Neurological:  Positive for tremors and weakness.  All other systems reviewed and are negative.   There were no vitals taken for this visit.There is no height or weight on  file to calculate BMI.  General Appearance: Casual and Fairly Groomed  Eye Contact:  Good  Speech:  Clear and Coherent  Volume:  Normal  Mood:  Euthymic  Affect:  Congruent  Thought Process:  Goal Directed  Orientation:  Full (Time, Place, and Person)  Thought Content:  Rumination   Suicidal Thoughts:  No  Homicidal Thoughts:  No  Memory:  Immediate;   Good Recent;   Good Remote;   NA  Judgement:  Good  Insight:  Good  Psychomotor Activity:  Tremor  Concentration:  Concentration: Good and Attention Span: Good  Recall:  Good  Fund of Knowledge: Good  Language: Good  Akathisia:  No  Handed:  Right  AIMS (if indicated): not done  Assets:  Communication Skills Desire for Improvement Resilience Social Support  ADL's:  Intact  Cognition: WNL  Sleep:  Good   Screenings: AIMS    Flowsheet Row Office Visit from 11/27/2023 in [facility] [place]  AIMS Total Score 19   PHQ2-9    Flowsheet Row Counselor from 01/20/2022 in [facility] [place] Video Visit from 12[facility]22/2021 in [facility] [place] Video Visit from 10/19/2021 in [facility] at [place] Video Visit from 09/15/2021 in [facility] at [place] Video Visit from 08/12/2021 in [facility] at [facility] Total Score 2 1 0 0 2  PHQ-9 Total Score 11 -- -- -- 7   Flowsheet Row ED from 01/04/2024 in [facility] Emergency Department at [facility] Counselor from 01/20/2022 in [facility] [place] ED from 01/16/2022 in [facility] Department at [facility]  C-SSRS RISK CATEGORY No Risk Low Risk No Risk     Assessment and Plan: This patient is a 53 year old male with a history of bipolar disorder generalized anxiety disorder benign essential tremor and probable tardive dyskinesia.  He has been having more falls.  We will cut down the Xanax  as noted in the HPI.  He will continue Prozac  60 mg daily for depression, lurasidone  120 mg daily as well as Lamictal  200 mg daily for mood stabilization, Wellbutrin  XL 150 mg daily for sexual side effects and Belsomra  20 mg  at bedtime as needed for sleep.  He will return to see me in 6 weeks  Collaboration of Care: Collaboration of Care: Primary Care Provider AEB notes are shared with PCP on the epic system  Patient/Guardian was advised Release of Information must be obtained prior to any record release in order to collaborate their care with an outside provider. Patient/Guardian was advised if they have not already done so to contact the registration department to sign all necessary forms in order for us  to release information regarding their care.   Consent: Patient/Guardian gives verbal consent for treatment and assignment of benefits for services provided during this visit. Patient/Guardian expressed understanding and agreed to proceed.    [name], MD 04/04/2024, 1:43 PM

## 2024-05-22 ENCOUNTER — Telehealth (HOSPITAL_COMMUNITY): Payer: Self-pay

## 2024-05-22 ENCOUNTER — Other Ambulatory Visit (HOSPITAL_COMMUNITY): Payer: Self-pay | Admitting: Psychiatry

## 2024-05-22 MED ORDER — VALBENAZINE TOSYLATE 40 MG PO CAPS: 40.0000 mg | ORAL_CAPSULE | Freq: Every day | ORAL | 2 refills | Status: DC

## 2024-05-22 NOTE — Telephone Encounter (Signed)
 Pt called in to have  valbenazine  (INGREZZA ) 40 MG capsule [facility]. He states that he received a prior approval letter but they need to have rx sent to them to fill. Pt scheduled 05/23/24. Please advise.

## 2024-05-22 NOTE — Telephone Encounter (Signed)
 sent

## 2024-05-23 ENCOUNTER — Other Ambulatory Visit (HOSPITAL_COMMUNITY): Payer: Self-pay | Admitting: Psychiatry

## 2024-05-23 ENCOUNTER — Encounter (HOSPITAL_COMMUNITY): Payer: Self-pay | Admitting: Psychiatry

## 2024-05-23 ENCOUNTER — Telehealth (HOSPITAL_COMMUNITY): Admitting: Psychiatry

## 2024-05-23 DIAGNOSIS — F3181 Bipolar II disorder: Secondary | ICD-10-CM | POA: Diagnosis not present

## 2024-05-23 DIAGNOSIS — G2401 Drug induced subacute dyskinesia: Secondary | ICD-10-CM

## 2024-05-23 DIAGNOSIS — F411 Generalized anxiety disorder: Secondary | ICD-10-CM

## 2024-05-23 MED ORDER — BELSOMRA 20 MG PO TABS: 20.0000 mg | ORAL_TABLET | Freq: Every day | ORAL | 2 refills | Status: AC

## 2024-05-23 MED ORDER — FLUOXETINE HCL 40 MG PO CAPS: 40.0000 mg | ORAL_CAPSULE | Freq: Every day | ORAL | 2 refills | Status: AC

## 2024-05-23 MED ORDER — LAMOTRIGINE 200 MG PO TABS: ORAL_TABLET | ORAL | 2 refills | Status: AC

## 2024-05-23 MED ORDER — VALBENAZINE TOSYLATE 40 MG PO CAPS: 40.0000 mg | ORAL_CAPSULE | Freq: Every day | ORAL | 2 refills | Status: AC

## 2024-05-23 MED ORDER — LURASIDONE HCL 120 MG PO TABS: 120.0000 mg | ORAL_TABLET | Freq: Every day | ORAL | 2 refills | Status: AC

## 2024-05-23 MED ORDER — BUPROPION HCL ER (XL) 150 MG PO TB24: 150.0000 mg | ORAL_TABLET | ORAL | 2 refills | Status: AC

## 2024-05-23 MED ORDER — FLUOXETINE HCL 20 MG PO CAPS: 20.0000 mg | ORAL_CAPSULE | Freq: Every day | ORAL | 2 refills | Status: AC

## 2024-05-23 NOTE — Telephone Encounter (Signed)
 I sent it to one of the [facility], I hope it works

## 2024-05-23 NOTE — Telephone Encounter (Signed)
 Called pt no answer left vm

## 2024-05-23 NOTE — Telephone Encounter (Signed)
 Pt aware.

## 2024-05-23 NOTE — Progress Notes (Signed)
 Virtual Visit via Video Note  I connected with Roger Duran on 05/23/24 at 10:00 AM EST by a video enabled telemedicine application and verified that I am speaking with the correct person using two identifiers.  Location: Patient: home Provider: office   I discussed the limitations of evaluation and management by telemedicine and the availability of in person appointments. The patient expressed understanding and agreed to proceed.    I discussed the assessment and treatment plan with the patient. The patient was provided an opportunity to ask questions and all were answered. The patient agreed with the plan and demonstrated an understanding of the instructions.   The patient was advised to call back or seek an in-person evaluation if the symptoms worsen or if the condition fails to improve as anticipated.  I provided 20 minutes of non-face-to-face time during this encounter.   [name], MD  [facility] MD/PA/NP OP Progress Note  05/23/2024 10:19 AM Roger Duran  MRN:  [id number]  Chief Complaint:  Chief Complaint  Patient presents with   Depression   Anxiety   Manic Behavior   Follow-up   HPI: This patient is a 89 year old single white male who lives with his roommate in [place].  He is on disability for bipolar disorder.  The patient returns for follow-up after 3 months regarding generalized anxiety, major depression bipolar disorder.  He also has tardive dyskinesia in terms of mouth movements and tongue thrusting.  We finally were able to get Ingrezza  approved and he is about to start it.  He is continuing to have falls.  We are slowly trying to get his Xanax  cut back.  He was on 2 mg 4 times daily but now he takes 2 mg in the morning and night and 1 mg twice daily in between.  I encouraged him to get down to 1 mg 4 times a day.  I also encouraged him to cut down on the gabapentin  but he states without it he has terrible pain going down his leg.  This may be part of why he is  falling and I urged him to discuss it with his neurologist.  In terms of mood he states he is doing pretty well.  He denies being significantly depressed or anxious.  He is sleeping well.  He denies any manic symptoms. Visit Diagnosis:    ICD-10-CM   1. Bipolar 2 disorder (HCC)  F31.81     2. GAD (generalized anxiety disorder)  F41.1     3. Tardive dyskinesia  G24.01       Past Psychiatric History: Hospitalization in 2009 after a suicide attempt, otherwise outpatient treatment  Past Medical History:  Past Medical History:  Diagnosis Date   Anxiety    Bipolar 1 disorder (HCC)    Depression    Mania (HCC)    Psoriasis     Past Surgical History:  Procedure Laterality Date   APPENDECTOMY     back procedures     COLONOSCOPY N/A 11/14/2014   Procedure: COLONOSCOPY;  Surgeon: Claudis RAYMOND Rivet, MD;  Location: [facility];  Service: Endoscopy;  Laterality: N/A;  11:10 - moved to 8:30 - Ann to notify   TONSILLECTOMY     WRIST SURGERY      Family Psychiatric History: See below  Family History:  Family History  Problem Relation Age of Onset   Hyperlipidemia Mother    Heart disease Father    Anxiety disorder Father    Bipolar disorder Maternal Grandfather  Social History:  Social History   Socioeconomic History   Marital status: Single    Spouse name: Not on file   Number of children: Not on file   Years of education: Not on file   Highest education level: Not on file  Occupational History   Not on file  Tobacco Use   Smoking status: Former    Current packs/day: 0.00    Types: Cigarettes, E-cigarettes    Quit date: 11/21/2019    Years since quitting: 4.5   Smokeless tobacco: Former  Building Services Engineer status: Never Used  Substance and Sexual Activity   Alcohol use: No    Alcohol/week: 0.0 standard drinks of alcohol    Comment: rarely   Drug use: Yes    Types: Marijuana    Comment: 1995-2000 PER PT HE DID A LOT OF COCAINE AND PILLS AND DRANK A LOT   Sexual  activity: Not Currently    Partners: Male    Birth control/protection: Condom  Other Topics Concern   Not on file  Social History Narrative   Not on file   Social Drivers of Health   Tobacco Use: Medium Risk (05/23/2024)   Patient History    Smoking Tobacco Use: Former    Smokeless Tobacco Use: Former    Passive Exposure: Not on Actuary Strain: Low Risk (04/17/2024)   Received from [facility]   Overall Financial Resource Strain (CARDIA)    How hard is it for you to pay for the very basics like food, housing, medical care, and heating?: Not hard at all  Food Insecurity: No Food Insecurity (04/17/2024)   [facility]   Epic    Within the past 12 months, you worried that your food would run out before you got the money to buy more.: Never true    Within the past 12 months, the food you bought just didn't last and you didn't have money to get more.: Never true  Transportation Needs: No Transportation Needs (04/17/2024)   Received from [facility]    In the past 12 months, has lack of transportation kept you from medical appointments or from getting medications?: No    In the past 12 months, has lack of transportation kept you from meetings, work, or from getting things needed for daily living?: No  Physical Activity: Insufficiently Active (04/17/2024)   Received from [facility]   Exercise Vital Sign    On average, how many days per week do you engage in moderate to strenuous exercise (like a brisk walk)?: 3 days    On average, how many minutes do you engage in exercise at this level?: 30 min  Stress: Stress Concern Present (04/17/2024)   Received from [facility] - Occupational Stress Questionnaire    Do you feel stress - tense, restless, nervous, or anxious, or unable to sleep at night because your mind is troubled all the time - these days?: To some extent  Social Connections: Socially  Integrated (04/17/2024)   Received from [facility]   Social Network    How would you rate your social network (family, work, friends)?: Good participation with social networks  Depression (PHQ2-9): High Risk (01/20/2022)   Depression (PHQ2-9)    PHQ-2 Score: 11  Alcohol Screen: Not on file  Housing: Low Risk (04/17/2024)   Received from [facility]    In the last 12 months,  was there a time when you were not able to pay the mortgage or rent on time?: No    In the past 12 months, how many times have you moved where you were living?: 0    At any time in the past 12 months, were you homeless or living in a shelter (including now)?: No  Utilities: At Risk (04/17/2024)   [facility]    In the past 12 months has the electric, gas, oil, or water  company threatened to shut off services in your home?: Yes  Health Literacy: Not on file    Allergies: Allergies[1]  Metabolic Disorder Labs: No results found for: HGBA1C, MPG No results found for: PROLACTIN No results found for: CHOL, TRIG, HDL, CHOLHDL, VLDL, LDLCALC No results found for: TSH  Therapeutic Level Labs: Lab Results  Component Value Date   LITHIUM  1.10 10/15/2014   No results found for: VALPROATE No results found for: CBMZ  Current Medications: Current Outpatient Medications  Medication Sig Dispense Refill   acetaminophen  (TYLENOL ) 500 MG tablet Take by mouth.     acyclovir  (ZOVIRAX ) 400 MG tablet TAKE (1) TABLET THREE TIMES DAILY AS NEEDED. 60 tablet 10   Adalimumab  (HUMIRA  PEN) 40 MG/0.4ML PNKT INJECT 40MG  SUBCUTANEOUSLY EVERY 2 WEEKS AS DIRECTED. (Patient not taking: Reported on 11/27/2023) 2 each 8   alprazolam  (XANAX ) 2 MG tablet Take 1 tablet (2 mg total) by mouth in the morning, at noon, in the evening, and at bedtime. 120 tablet 2   buPROPion  (WELLBUTRIN  XL) 150 MG 24 hr tablet Take 1 tablet (150 mg total) by mouth every morning. 30 tablet 2   clobetasol   (TEMOVATE ) 0.05 % external solution Apply 1 application topically 2 (two) times daily. 50 mL 9   COSENTYX UNOREADY 300 MG/2ML SOAJ Inject 300 mg into the skin every 28 (twenty-eight) days.     cycloSPORINE  (RESTASIS ) 0.05 % ophthalmic emulsion Place 1 drop into both eyes 2 (two) times daily.     EPINEPHrine 0.3 mg/0.3 mL IJ SOAJ injection Inject into the muscle.     FLUoxetine  (PROZAC ) 20 MG capsule Take 1 capsule (20 mg total) by mouth daily. 30 capsule 2   FLUoxetine  (PROZAC ) 40 MG capsule Take 1 capsule (40 mg total) by mouth daily. 30 capsule 2   gabapentin  (NEURONTIN ) 600 MG tablet Take 600 mg by mouth 4 (four) times daily.     lamoTRIgine  (LAMICTAL ) 200 MG tablet TAKE  (1)  TABLET TWICE A DAY. 60 tablet 2   lidocaine  (LIDODERM ) 5 % Place 1 patch onto the skin daily. Remove & Discard patch within 12 hours or as directed by MD 14 patch 0   Lurasidone  HCl (LATUDA ) 120 MG TABS Take 1 tablet (120 mg total) by mouth daily. 30 tablet 2   methocarbamol  (ROBAXIN ) 500 MG tablet Take 2 tablets (1,000 mg total) by mouth every 8 (eight) hours as needed for muscle spasms. 30 tablet 0   Multiple Vitamins-Minerals (MENS 50+ MULTI VITAMIN/MIN) TABS Take 1 tablet by mouth every morning.     Omega-3 Fatty Acids (COROMEGA OMEGA 3 SQUEEZE) EMUL Take by mouth.     omeprazole (PRILOSEC) 20 MG capsule Take 20 mg by mouth daily.     oxyCODONE  (OXY IR/ROXICODONE ) 5 MG immediate release tablet Take 1 tablet (5 mg total) by mouth every 6 (six) hours as needed for severe pain (pain score 7-10). 10 tablet 0   Potassium Gluconate 2.5 MEQ TABS Take by mouth.  Suvorexant  (BELSOMRA ) 20 MG TABS Take 1 tablet (20 mg total) by mouth at bedtime. 30 tablet 2   tadalafil (CIALIS) 5 MG tablet Take 5 mg by mouth daily.     valbenazine  (INGREZZA ) 40 MG capsule Take 1 capsule (40 mg total) by mouth daily. 90 capsule 2   VEMLIDY 25 MG TABS Take 1 tablet by mouth daily.     No current facility-administered medications for this  visit.     Musculoskeletal: Strength & Muscle Tone: within normal limits Gait & Station: unsteady Patient leans: N/A  Psychiatric Specialty Exam: Review of Systems  Musculoskeletal:  Positive for back pain and gait problem.  Neurological:  Positive for tremors.  All other systems reviewed and are negative.   There were no vitals taken for this visit.There is no height or weight on file to calculate BMI.  General Appearance: Casual and Fairly Groomed  Eye Contact:  Good  Speech:  Clear and Coherent  Volume:  Normal  Mood:  Euthymic  Affect:  Congruent  Thought Process:  Goal Directed  Orientation:  Full (Time, Place, and Person)  Thought Content: WDL   Suicidal Thoughts:  No  Homicidal Thoughts:  No  Memory:  Immediate;   Good Recent;   Good Remote;   NA  Judgement:  Good  Insight:  Fair  Psychomotor Activity:  TD and Tremor  Concentration:  Concentration: Good and Attention Span: Good  Recall:  Good  Fund of Knowledge: Good  Language: Good  Akathisia:  No  Handed:  Right  AIMS (if indicated): Significant for mouth movements as noted above  Assets:  Communication Skills Desire for Improvement Resilience Social Support  ADL's:  Intact  Cognition: WNL  Sleep:  Good   Screenings: AIMS    Flowsheet Row Office Visit from 11/27/2023 in [facility] [place]  AIMS Total Score 19   PHQ2-9    Flowsheet Row Counselor from 01/20/2022 in [facility] [place] Video Visit from 12/22/2021 in [facility] [place] Video Visit from 10/19/2021 in [facility] [place] Video Visit from 09/15/2021 in [facility] at [place] Video Visit from 08/12/2021 in [facility] Total Score 2 1 0 0 2  PHQ-9 Total Score 11 -- -- -- 7   Flowsheet Row ED from 01/04/2024 in [facility] Department at [facility] Counselor from 01/20/2022 in [facility] [place] ED from 01/16/2022 in [facility] Emergency Department at [facility]  C-SSRS RISK CATEGORY No Risk Low Risk No Risk     Assessment and Plan: This patient is a 54 year old male with a history of bipolar disorder generalized anxiety disorder benign essential tremor and probable tardive dyskinesia.  We are working on cutting down Xanax  in case is contributing to the falls and I would like him to get to 1 mg 4 times daily.  He will continue Prozac  60 mg daily for depression lurasidone  120 mg daily as well as Lamictal  200 mg daily for mood stabilization, Wellbutrin  1 XL 150 mg daily for sexual side effects and Belsomra  20 mg at bedtime as needed for sleep.  He will return to see me in 3 months  Collaboration of Care: Collaboration of Care: Primary Care Provider AEB notes are shared with PCP on the epic system  Patient/Guardian was advised Release of Information must be obtained prior to  any record release in order to collaborate their care with an outside provider. Patient/Guardian was advised if they have not already done so to contact the registration department to sign all necessary forms in order for us  to release information regarding their care.   Consent: Patient/Guardian gives verbal consent for treatment and assignment of benefits for services provided during this visit. Patient/Guardian expressed understanding and agreed to proceed.    [name], MD 05/23/2024, 10:19 AM     [1]  Allergies Allergen Reactions   Apple Juice Itching and Swelling    Throat and mouth itching and swelling Throat and mouth itching and swelling   Bee Venom Anaphylaxis and Other (See Comments)    Weak, shaky, dizzy. Weak, shaky, dizzy.   Morphine Palpitations   Aspirin Nausea And Vomiting   Sulfa Antibiotics Nausea And Vomiting   Dicyclomine  Other (See Comments)    Aka  Dicyclomine  Aka Dicyclomine    Other Other (See Comments)    Aka Dicyclomine    Penicillins Itching and Rash

## 2024-05-23 NOTE — Telephone Encounter (Signed)
 Per pt call and voicemail,   Per pt [facility] do not carry the Ingrezza . Per pt he would like the script to be sent to a specialty pharmacy.  One of the specialty pharmacy is [facility] and ?.

## 2024-05-27 NOTE — Telephone Encounter (Signed)
 Lmom, noted

## 2024-08-14 ENCOUNTER — Telehealth (HOSPITAL_COMMUNITY): Admitting: Psychiatry
